# Patient Record
Sex: Female | Born: 1950
Health system: Southern US, Academic
[De-identification: ages and names within clinical notes are randomized; demographics above are authoritative.]

## PROBLEM LIST (undated history)

## (undated) ENCOUNTER — Encounter
Attending: Student in an Organized Health Care Education/Training Program | Primary: Student in an Organized Health Care Education/Training Program

## (undated) ENCOUNTER — Encounter: Attending: Adult Health | Primary: Adult Health

## (undated) ENCOUNTER — Encounter

## (undated) ENCOUNTER — Encounter: Attending: Internal Medicine | Primary: Internal Medicine

## (undated) ENCOUNTER — Ambulatory Visit: Payer: MEDICARE

## (undated) ENCOUNTER — Ambulatory Visit: Payer: Medicare (Managed Care)

## (undated) ENCOUNTER — Telehealth

## (undated) ENCOUNTER — Telehealth: Attending: Internal Medicine | Primary: Internal Medicine

## (undated) ENCOUNTER — Telehealth: Attending: Adult Health | Primary: Adult Health

## (undated) ENCOUNTER — Encounter: Attending: Diagnostic Radiology | Primary: Diagnostic Radiology

## (undated) ENCOUNTER — Ambulatory Visit: Payer: MEDICARE | Attending: Internal Medicine | Primary: Internal Medicine

## (undated) ENCOUNTER — Telehealth
Attending: Student in an Organized Health Care Education/Training Program | Primary: Student in an Organized Health Care Education/Training Program

## (undated) ENCOUNTER — Ambulatory Visit: Payer: MEDICARE | Attending: Oncology | Primary: Oncology

## (undated) ENCOUNTER — Inpatient Hospital Stay

## (undated) ENCOUNTER — Ambulatory Visit

## (undated) ENCOUNTER — Encounter: Attending: Medical | Primary: Medical

## (undated) ENCOUNTER — Ambulatory Visit: Payer: MEDICARE | Attending: Adult Health | Primary: Adult Health

## (undated) ENCOUNTER — Telehealth: Attending: Urology | Primary: Urology

## (undated) ENCOUNTER — Inpatient Hospital Stay: Payer: Medicare (Managed Care)

## (undated) ENCOUNTER — Inpatient Hospital Stay: Payer: MEDICARE

## (undated) ENCOUNTER — Ambulatory Visit
Attending: Student in an Organized Health Care Education/Training Program | Primary: Student in an Organized Health Care Education/Training Program

## (undated) ENCOUNTER — Encounter: Attending: Pulmonary Disease | Primary: Pulmonary Disease

## (undated) ENCOUNTER — Encounter: Attending: Certified Registered" | Primary: Certified Registered"

## (undated) ENCOUNTER — Ambulatory Visit
Payer: Medicare (Managed Care) | Attending: Student in an Organized Health Care Education/Training Program | Primary: Student in an Organized Health Care Education/Training Program

## (undated) ENCOUNTER — Ambulatory Visit: Payer: MEDICARE | Attending: Vascular Surgery | Primary: Vascular Surgery

## (undated) ENCOUNTER — Encounter: Attending: Physical Medicine & Rehabilitation | Primary: Physical Medicine & Rehabilitation

## (undated) ENCOUNTER — Ambulatory Visit: Payer: MEDICARE | Attending: Physical Medicine & Rehabilitation | Primary: Physical Medicine & Rehabilitation

## (undated) ENCOUNTER — Telehealth: Attending: Oncology | Primary: Oncology

## (undated) DIAGNOSIS — E785 Hyperlipidemia, unspecified: Secondary | ICD-10-CM

## (undated) DIAGNOSIS — G2581 Restless legs syndrome: Secondary | ICD-10-CM

## (undated) DIAGNOSIS — Z8719 Personal history of other diseases of the digestive system: Secondary | ICD-10-CM

## (undated) DIAGNOSIS — K219 Gastro-esophageal reflux disease without esophagitis: Secondary | ICD-10-CM

## (undated) DIAGNOSIS — F32A Depression, unspecified: Secondary | ICD-10-CM

## (undated) DIAGNOSIS — Z8679 Personal history of other diseases of the circulatory system: Secondary | ICD-10-CM

## (undated) DIAGNOSIS — K5790 Diverticulosis of intestine, part unspecified, without perforation or abscess without bleeding: Secondary | ICD-10-CM

## (undated) DIAGNOSIS — M51369 Other intervertebral disc degeneration, lumbar region without mention of lumbar back pain or lower extremity pain: Secondary | ICD-10-CM

## (undated) DIAGNOSIS — I10 Essential (primary) hypertension: Secondary | ICD-10-CM

## (undated) DIAGNOSIS — G8929 Other chronic pain: Secondary | ICD-10-CM

## (undated) DIAGNOSIS — H43819 Vitreous degeneration, unspecified eye: Secondary | ICD-10-CM

## (undated) DIAGNOSIS — J309 Allergic rhinitis, unspecified: Secondary | ICD-10-CM

## (undated) DIAGNOSIS — M797 Fibromyalgia: Secondary | ICD-10-CM

## (undated) DIAGNOSIS — G4733 Obstructive sleep apnea (adult) (pediatric): Secondary | ICD-10-CM

## (undated) DIAGNOSIS — M5136 Other intervertebral disc degeneration, lumbar region: Secondary | ICD-10-CM

## (undated) DIAGNOSIS — M545 Low back pain, unspecified: Secondary | ICD-10-CM

## (undated) DIAGNOSIS — F329 Major depressive disorder, single episode, unspecified: Secondary | ICD-10-CM

## (undated) DIAGNOSIS — M1990 Unspecified osteoarthritis, unspecified site: Secondary | ICD-10-CM

## (undated) DIAGNOSIS — Z87448 Personal history of other diseases of urinary system: Secondary | ICD-10-CM

## (undated) DIAGNOSIS — Z8711 Personal history of peptic ulcer disease: Secondary | ICD-10-CM

## (undated) DIAGNOSIS — E669 Obesity, unspecified: Secondary | ICD-10-CM

## (undated) DIAGNOSIS — Z8669 Personal history of other diseases of the nervous system and sense organs: Secondary | ICD-10-CM

## (undated) HISTORY — DX: Gastro-esophageal reflux disease without esophagitis: K21.9

## (undated) HISTORY — DX: Personal history of other diseases of the digestive system: Z87.19

## (undated) HISTORY — DX: Fibromyalgia: M79.7

## (undated) HISTORY — DX: Depression, unspecified: F32.A

## (undated) HISTORY — DX: Personal history of peptic ulcer disease: Z87.11

## (undated) HISTORY — DX: Restless legs syndrome: G25.81

## (undated) HISTORY — DX: Low back pain: M54.5

## (undated) HISTORY — DX: Personal history of other diseases of the nervous system and sense organs: Z86.69

## (undated) HISTORY — DX: Unspecified osteoarthritis, unspecified site: M19.90

## (undated) HISTORY — DX: Obstructive sleep apnea (adult) (pediatric): G47.33

## (undated) HISTORY — DX: Diverticulosis of intestine, part unspecified, without perforation or abscess without bleeding: K57.90

## (undated) HISTORY — DX: Major depressive disorder, single episode, unspecified: F32.9

## (undated) HISTORY — DX: Hyperlipidemia, unspecified: E78.5

## (undated) HISTORY — DX: Obesity, unspecified: E66.9

## (undated) HISTORY — DX: Allergic rhinitis, unspecified: J30.9

## (undated) HISTORY — DX: Personal history of other diseases of the circulatory system: Z86.79

## (undated) HISTORY — DX: Other intervertebral disc degeneration, lumbar region without mention of lumbar back pain or lower extremity pain: M51.369

## (undated) HISTORY — DX: Essential (primary) hypertension: I10

## (undated) HISTORY — DX: Low back pain, unspecified: M54.50

## (undated) HISTORY — DX: Other chronic pain: G89.29

## (undated) HISTORY — DX: Other intervertebral disc degeneration, lumbar region: M51.36

## (undated) HISTORY — DX: Personal history of other diseases of urinary system: Z87.448

## (undated) HISTORY — DX: Vitreous degeneration, unspecified eye: H43.819

---

## 1990-02-06 HISTORY — PX: TONSILLECTOMY AND ADENOIDECTOMY: SHX28

## 2004-05-15 ENCOUNTER — Emergency Department: Payer: Self-pay | Admitting: Emergency Medicine

## 2004-07-27 ENCOUNTER — Ambulatory Visit: Payer: Self-pay | Admitting: Physician Assistant

## 2006-02-06 DIAGNOSIS — K5790 Diverticulosis of intestine, part unspecified, without perforation or abscess without bleeding: Secondary | ICD-10-CM

## 2006-02-06 HISTORY — PX: CARDIOVASCULAR STRESS TEST: SHX262

## 2006-02-06 HISTORY — DX: Diverticulosis of intestine, part unspecified, without perforation or abscess without bleeding: K57.90

## 2006-03-09 HISTORY — PX: COLONOSCOPY: SHX174

## 2006-03-09 HISTORY — PX: ESOPHAGOGASTRODUODENOSCOPY: SHX1529

## 2006-03-09 LAB — HM COLONOSCOPY

## 2009-08-17 ENCOUNTER — Emergency Department: Payer: Self-pay | Admitting: Unknown Physician Specialty

## 2010-02-06 HISTORY — PX: TOTAL ABDOMINAL HYSTERECTOMY: SHX209

## 2011-03-18 ENCOUNTER — Inpatient Hospital Stay: Payer: Self-pay | Admitting: Internal Medicine

## 2011-03-18 LAB — CBC
HGB: 15 g/dL (ref 12.0–16.0)
MCV: 86 fL (ref 80–100)
Platelet: 158 10*3/uL (ref 150–440)
RBC: 5.11 10*6/uL (ref 3.80–5.20)
RDW: 13.3 % (ref 11.5–14.5)
WBC: 11.1 10*3/uL — ABNORMAL HIGH (ref 3.6–11.0)

## 2011-03-18 LAB — COMPREHENSIVE METABOLIC PANEL
Albumin: 3.9 g/dL (ref 3.4–5.0)
Alkaline Phosphatase: 60 U/L (ref 50–136)
BUN: 17 mg/dL (ref 7–18)
Bilirubin,Total: 0.4 mg/dL (ref 0.2–1.0)
Co2: 30 mmol/L (ref 21–32)
Creatinine: 1.06 mg/dL (ref 0.60–1.30)
Glucose: 107 mg/dL — ABNORMAL HIGH (ref 65–99)
Osmolality: 287 (ref 275–301)
Sodium: 143 mmol/L (ref 136–145)
Total Protein: 7.7 g/dL (ref 6.4–8.2)

## 2011-03-18 LAB — LIPASE, BLOOD: Lipase: 91 U/L (ref 73–393)

## 2011-03-18 LAB — PROTIME-INR: INR: 0.9

## 2011-03-19 LAB — CBC WITH DIFFERENTIAL/PLATELET
Basophil #: 0 10*3/uL (ref 0.0–0.1)
Eosinophil #: 0 10*3/uL (ref 0.0–0.7)
HCT: 38.7 % (ref 35.0–47.0)
Lymphocyte #: 1.3 10*3/uL (ref 1.0–3.6)
Lymphocyte %: 13.8 %
MCHC: 33.4 g/dL (ref 32.0–36.0)
MCV: 88 fL (ref 80–100)
Neutrophil #: 7.3 10*3/uL — ABNORMAL HIGH (ref 1.4–6.5)
Neutrophil %: 77 %
RDW: 13.3 % (ref 11.5–14.5)

## 2011-03-19 LAB — COMPREHENSIVE METABOLIC PANEL
Albumin: 3.1 g/dL — ABNORMAL LOW (ref 3.4–5.0)
Alkaline Phosphatase: 50 U/L (ref 50–136)
BUN: 13 mg/dL (ref 7–18)
Calcium, Total: 8.3 mg/dL — ABNORMAL LOW (ref 8.5–10.1)
Chloride: 104 mmol/L (ref 98–107)
Co2: 27 mmol/L (ref 21–32)
Creatinine: 0.87 mg/dL (ref 0.60–1.30)
Glucose: 108 mg/dL — ABNORMAL HIGH (ref 65–99)
Osmolality: 282 (ref 275–301)
SGOT(AST): 16 U/L (ref 15–37)
Sodium: 141 mmol/L (ref 136–145)

## 2011-03-19 LAB — URINALYSIS, COMPLETE
Bilirubin,UR: NEGATIVE
Blood: NEGATIVE
Glucose,UR: NEGATIVE mg/dL (ref 0–75)
Nitrite: NEGATIVE
Protein: NEGATIVE
Specific Gravity: 1.038 (ref 1.003–1.030)
Squamous Epithelial: 5

## 2011-03-19 LAB — CLOSTRIDIUM DIFFICILE BY PCR

## 2011-03-19 LAB — MAGNESIUM: Magnesium: 1.4 mg/dL — ABNORMAL LOW

## 2011-03-21 LAB — STOOL CULTURE

## 2011-03-21 LAB — HEMOGLOBIN: HGB: 12.6 g/dL (ref 12.0–16.0)

## 2011-03-24 LAB — CULTURE, BLOOD (SINGLE)

## 2011-04-07 HISTORY — PX: OTHER SURGICAL HISTORY: SHX169

## 2011-06-10 ENCOUNTER — Ambulatory Visit: Payer: Self-pay

## 2011-06-10 LAB — RAPID STREP-A WITH REFLX: Micro Text Report: NEGATIVE

## 2012-12-05 ENCOUNTER — Encounter: Payer: Self-pay | Admitting: Family Medicine

## 2012-12-05 ENCOUNTER — Ambulatory Visit (INDEPENDENT_AMBULATORY_CARE_PROVIDER_SITE_OTHER): Payer: Medicare PPO | Admitting: Family Medicine

## 2012-12-05 VITALS — BP 128/78 | HR 79 | Temp 98.2°F | Ht 62.5 in | Wt 202.5 lb

## 2012-12-05 DIAGNOSIS — F32A Depression, unspecified: Secondary | ICD-10-CM

## 2012-12-05 DIAGNOSIS — F329 Major depressive disorder, single episode, unspecified: Secondary | ICD-10-CM

## 2012-12-05 DIAGNOSIS — M797 Fibromyalgia: Secondary | ICD-10-CM | POA: Insufficient documentation

## 2012-12-05 DIAGNOSIS — G2581 Restless legs syndrome: Secondary | ICD-10-CM

## 2012-12-05 DIAGNOSIS — M129 Arthropathy, unspecified: Secondary | ICD-10-CM

## 2012-12-05 DIAGNOSIS — F3289 Other specified depressive episodes: Secondary | ICD-10-CM

## 2012-12-05 DIAGNOSIS — M25579 Pain in unspecified ankle and joints of unspecified foot: Secondary | ICD-10-CM

## 2012-12-05 DIAGNOSIS — M1990 Unspecified osteoarthritis, unspecified site: Secondary | ICD-10-CM

## 2012-12-05 DIAGNOSIS — IMO0001 Reserved for inherently not codable concepts without codable children: Secondary | ICD-10-CM

## 2012-12-05 DIAGNOSIS — Z23 Encounter for immunization: Secondary | ICD-10-CM

## 2012-12-05 DIAGNOSIS — E785 Hyperlipidemia, unspecified: Secondary | ICD-10-CM

## 2012-12-05 DIAGNOSIS — I10 Essential (primary) hypertension: Secondary | ICD-10-CM

## 2012-12-05 DIAGNOSIS — K219 Gastro-esophageal reflux disease without esophagitis: Secondary | ICD-10-CM

## 2012-12-05 DIAGNOSIS — M25572 Pain in left ankle and joints of left foot: Secondary | ICD-10-CM

## 2012-12-05 NOTE — Assessment & Plan Note (Signed)
Not consistent with ankle sprain given chronicity, not consistent with peroneal tendonitis. Will monitor for now.  Pt aware to return if persistent or worsening.

## 2012-12-05 NOTE — Assessment & Plan Note (Signed)
Stable on ppi. Continue.

## 2012-12-05 NOTE — Progress Notes (Signed)
Subjective:    Patient ID: Michelle Mcgrath, female    DOB: [birth year], 62 y.o.   MRN: [id number]  HPI CC: new pt to establish  Lives with fiancee - recently moved from [place].  Prior saw Dr. Anselm Jungling.  Will fill out form today. Lost son in May to bladder cancer.  L ankle pain - started after she stepped badly off sidewalk 1990s.  Intermittent pain.  Getting worse.  Points to lateral ankle joint.  On disability for back pain from arthritis and depression.  Fibromyalgia - tends to get worse in winter. RLS on gabapentin for this. Arthritis - unsure which, likely osteoarthritis.  Told had deteriorating discs in back.  Hand arthritis.  Present for several years. HTN - compliant with med. HLD - compliant with lovastatin.  No myalgias with this. GERD - on omeprazole regularly. Depression - was on cymbalta then prior PCP discontinued this.  Preventative: Last CPE unsure Flu shot today  Lives with fiance, 1 dog Grown children (2 sons, youngest died with bladder cancer) Occ: disability for FM, lower back pain, depression Edu: 8th grade Activity: no regular exercise  Medications and allergies reviewed and updated in chart.  Past histories reviewed and updated if relevant as below. There are no active problems to display for this patient.  Past Medical History  Diagnosis Date  . Arthritis     back and fingers  . Fibromyalgia   . History of depression   . GERD (gastroesophageal reflux disease)   . History of stomach ulcers   . Allergic rhinitis     environmental  . Hypertension   . HLD (hyperlipidemia)   . History of migraine     none since better BP control  . History of rheumatic fever    Past Surgical History  Procedure Laterality Date  . Total abdominal hysterectomy  2012    bladder drop  . Tonsillectomy and adenoidectomy  1992   History  Substance Use Topics  . Smoking status: Never Smoker   . Smokeless tobacco: Never Used  . Alcohol Use: No   Family History   Problem Relation Age of Onset  . Hypertension Son   . Arthritis Son   . CAD Neg Hx   . Stroke Neg Hx   . Diabetes Neg Hx   . Cancer Son     bladder (smoker)   No Known Allergies No current outpatient prescriptions on file prior to visit.   No current facility-administered medications on file prior to visit.     Review of Systems  Constitutional: Negative for fever, chills, activity change, appetite change, fatigue and unexpected weight change.  HENT: Negative for hearing loss.   Eyes: Negative for visual disturbance.  Respiratory: Negative for cough, chest tightness, shortness of breath and wheezing.   Cardiovascular: Negative for chest pain, palpitations and leg swelling.  Gastrointestinal: Negative for nausea, vomiting, abdominal pain, diarrhea, constipation, blood in stool and abdominal distention.  Genitourinary: Negative for hematuria and difficulty urinating.  Musculoskeletal: Positive for arthralgias and back pain. Negative for myalgias and neck pain.  Skin: Negative for rash.  Neurological: Negative for dizziness, seizures, syncope and headaches.  Hematological: Negative for adenopathy. Does not bruise/bleed easily.  Psychiatric/Behavioral: Negative for dysphoric mood. The patient is not nervous/anxious.        Objective:   Physical Exam  Nursing note and vitals reviewed. Constitutional: She is oriented to person, place, and time. She appears well-developed and well-nourished. No distress.  HENT:  Head: Normocephalic and  atraumatic.  Mouth/Throat: Oropharynx is clear and moist. No oropharyngeal exudate.  Eyes: Conjunctivae and EOM are normal. Pupils are equal, round, and reactive to light. No scleral icterus.  Neck: Normal range of motion. Neck supple. No thyromegaly present.  Cardiovascular: Normal rate, regular rhythm, normal heart sounds and intact distal pulses.   No murmur heard. Pulses:      Radial pulses are 2+ on the right side, and 2+ on the left side.   Pulmonary/Chest: Effort normal and breath sounds normal. No respiratory distress. She has no wheezes. She has no rales.  Musculoskeletal: Normal range of motion. She exhibits no edema.  L foot: No ligament laxity. No lateral/medial malleolar tenderness 2+ DP bilaterally Sensation intact Tender around navicular on left. No peroneal tendon tenderness  Lymphadenopathy:    She has no cervical adenopathy.  Neurological: She is alert and oriented to person, place, and time.  CN grossly intact, station and gait intact  Skin: Skin is warm and dry. No rash noted.  Psychiatric: She has a normal mood and affect. Her behavior is normal. Judgment and thought content normal.       Assessment & Plan:

## 2012-12-05 NOTE — Assessment & Plan Note (Signed)
Continue gabapentin for this.  

## 2012-12-05 NOTE — Addendum Note (Signed)
Addended by: Shon Millet on: 12/05/2012 04:35 PM   Modules accepted: Orders

## 2012-12-05 NOTE — Assessment & Plan Note (Signed)
Await records. Off cymbalta. Continue to monitor. On tylenol prn pain.

## 2012-12-05 NOTE — Assessment & Plan Note (Signed)
Chronic, stable. Continue lovastatin. Check FLP next fasting blood work - await records.

## 2012-12-05 NOTE — Assessment & Plan Note (Signed)
Only on wellbutrin. Continue med.  Await records.

## 2012-12-05 NOTE — Assessment & Plan Note (Signed)
Chronic, stable. Well controlled. Continue regimen.

## 2012-12-05 NOTE — Assessment & Plan Note (Signed)
Anticipate OA. Await records. Discussed stopping narcotic and using tylenol OTC instead.  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

## 2012-12-05 NOTE — Patient Instructions (Addendum)
Sign release form for records from prior doctor, Dr. Enrigue Catena in [place]. Flu shot today. You are doing well today. Return in 3-6 months for follow up or as needed.

## 2012-12-18 ENCOUNTER — Telehealth: Payer: Self-pay

## 2012-12-18 NOTE — Telephone Encounter (Signed)
Pt left v/m; seen to establish 12/05/12 and pt continuing problem with restless leg syndrome. Gabapentin makes pt sleepy; unable to reach pt for further info. Left v/m requesting cb.

## 2012-12-20 ENCOUNTER — Encounter: Payer: Self-pay | Admitting: *Deleted

## 2012-12-20 ENCOUNTER — Ambulatory Visit (INDEPENDENT_AMBULATORY_CARE_PROVIDER_SITE_OTHER): Payer: Medicare PPO | Admitting: Family Medicine

## 2012-12-20 ENCOUNTER — Encounter: Payer: Self-pay | Admitting: Family Medicine

## 2012-12-20 VITALS — BP 144/82 | HR 68 | Temp 98.2°F | Wt 210.8 lb

## 2012-12-20 DIAGNOSIS — G2581 Restless legs syndrome: Secondary | ICD-10-CM

## 2012-12-20 DIAGNOSIS — F32A Depression, unspecified: Secondary | ICD-10-CM

## 2012-12-20 DIAGNOSIS — R5381 Other malaise: Secondary | ICD-10-CM

## 2012-12-20 DIAGNOSIS — F3289 Other specified depressive episodes: Secondary | ICD-10-CM

## 2012-12-20 DIAGNOSIS — R5383 Other fatigue: Secondary | ICD-10-CM

## 2012-12-20 DIAGNOSIS — F329 Major depressive disorder, single episode, unspecified: Secondary | ICD-10-CM

## 2012-12-20 LAB — BASIC METABOLIC PANEL
Calcium: 9.4 mg/dL (ref 8.4–10.5)
Chloride: 102 mEq/L (ref 96–112)
GFR: 66.02 mL/min (ref >60.00)
Glucose, Bld: 101 mg/dL — ABNORMAL HIGH (ref 70–99)
Potassium: 4 mEq/L (ref 3.5–5.1)
Sodium: 139 mEq/L (ref 135–145)

## 2012-12-20 LAB — CBC WITH DIFFERENTIAL/PLATELET
Basophils Absolute: 0 10*3/uL (ref 0.0–0.1)
Eosinophils Absolute: 0.1 10*3/uL (ref 0.0–0.7)
HCT: 41.9 % (ref 36.0–46.0)
Lymphs Abs: 2 10*3/uL (ref 0.7–4.0)
MCHC: 33.9 g/dL (ref 30.0–36.0)
MCV: 86.3 fl (ref 78.0–100.0)
Monocytes Absolute: 0.7 10*3/uL (ref 0.1–1.0)
Platelets: 164 10*3/uL (ref 150.0–400.0)
RDW: 13.3 % (ref 11.5–14.6)

## 2012-12-20 LAB — IBC PANEL: Iron: 98 ug/dL (ref 42–145)

## 2012-12-20 MED ORDER — SERTRALINE HCL 25 MG PO TABS: 25.0000 mg | ORAL_TABLET | Freq: Every day | ORAL | Status: DC

## 2012-12-20 MED ORDER — ROPINIROLE HCL 0.25 MG PO TABS: 0.5000 mg | ORAL_TABLET | Freq: Every day | ORAL | Status: DC

## 2012-12-20 NOTE — Assessment & Plan Note (Signed)
Worsening as holiday season approaching.  On wellbutrin 150mg  SL bid daily. Prior on cymbalta 60mg  daily but prior PCP discontinued this. Will start sertraline 25mg  daily, consider increase as needed.

## 2012-12-20 NOTE — Assessment & Plan Note (Addendum)
Actually story consistent with worsening RLS, as well as intolerance to gabapentin 2/2 oversedation. Will stop gabapentin and start requip - start at 0.25mg  nightly, then increase to 0.5mg  nightly for several weeks, pt will update me with effect and will decide need for continued titration accordingly. Check iron panel for worsening RLS

## 2012-12-20 NOTE — Patient Instructions (Addendum)
Let's start sertraline instead of cymbalta.  Continue wellbutrin for now twice daily. For restless legs - stop gabapentin as it's overly sedating.  Start requip at 1 tablet nightly for the next 3 nights then increase to 2 tablets nightly and after 2 weeks give me an update on how legs are doing. Blood work today to check for other causes of fatigue. Give me an update with how you're doing in the next few weeks I'm still waiting on prior doctor's records

## 2012-12-20 NOTE — Progress Notes (Signed)
  Subjective:    Patient ID: Michelle Mcgrath, female    DOB: 1951/01/06, 62 y.o.   MRN: [id number]  HPI CC: bilateral leg pain  Pleasant 62 yo with h/o FM, RLS and arthritis (unsure type but anticipate OA) presents with worsening leg pains.  Endorses constant leg ache and weakness from thighs to calves.  Pain worse at night time but can be constant.  Some left ankle burning pain.  Actually describes discomfort as "restlessness sensation with need to move legs to relieve sensation". No back pain, leg numbness, bowel/bladder accidents, fevers.  No actual leg pain. Staying tired and fatigued. She regularly takes tylenol 2-4 tablets daily for pain and gabapentin 600mg  nightly (for RLS).  Finds that she stays sedated into the next day with her gabapentin.  Wt Readings from Last 3 Encounters:  12/20/12 210 lb 12 oz (95.596 kg)  12/05/12 202 lb 8 oz (91.853 kg)  8 lb weight gain in last 2 weeks.  Denies chest pain, coughing.  Some dyspnea worse with exertion.  No orthopnea.  No leg swelling.  Past Medical History  Diagnosis Date  . Arthritis     back and fingers  . Fibromyalgia   . Depression   . GERD (gastroesophageal reflux disease)   . History of stomach ulcers   . Allergic rhinitis     environmental  . Hypertension   . HLD (hyperlipidemia)   . History of migraine     none since better BP control  . History of rheumatic fever   . RLS (restless legs syndrome)     on gabapentin    Review of Systems Per HPI    Objective:   Physical Exam  Nursing note and vitals reviewed. Constitutional: She appears well-developed and well-nourished. No distress.  Musculoskeletal: She exhibits no edema.  Psychiatric: She has a normal mood and affect. Her behavior is normal. Judgment and thought content normal.  Tears with discussion of son's passing last May       Assessment & Plan:

## 2012-12-20 NOTE — Progress Notes (Signed)
Pre-visit discussion using our clinic review tool. No additional management support is needed unless otherwise documented below in the visit note.  

## 2012-12-25 NOTE — Telephone Encounter (Signed)
Pt was seen 12/20/12 for restless leg syndrome.

## 2013-01-10 ENCOUNTER — Telehealth: Payer: Self-pay

## 2013-01-10 MED ORDER — TRAMADOL HCL 50 MG PO TABS: 50.0000 mg | ORAL_TABLET | Freq: Three times a day (TID) | ORAL | Status: DC | PRN

## 2013-01-10 NOTE — Telephone Encounter (Signed)
Pt left v/m requesting cb. 

## 2013-01-10 NOTE — Telephone Encounter (Signed)
Noted. Agree thanks.  

## 2013-01-10 NOTE — Telephone Encounter (Signed)
Pt seen 12/20/12; pt discussed with Dr Reece Agar pain top of lt foot and lt ankle;lt foot and ankle is swollen, no redness. When pt keeps feet up when goes to bed swelling goes down. pt has tried soaking the foot, ibuprofen and arthritis cream with no relief of pain. Pain level now is 8. Lt foot hurts worse upon weight bearing.[facility]. Pt request cb.

## 2013-01-10 NOTE — Telephone Encounter (Signed)
When i evaluated her I thought sxs more consistent with RLS - and prescribed requip. How has this affected sxs?  Will likely recommend f/u appt next week. May try tramadol prn pain - plz phone in and caution sedation precautions.

## 2013-01-10 NOTE — Telephone Encounter (Signed)
Rx called in as directed. Patient notified. She said that requip has helped some but she is still having pain which is making her feel as though she has to get up and walk around. Follow up scheduled for next week.

## 2013-01-17 ENCOUNTER — Encounter: Payer: Self-pay | Admitting: Family Medicine

## 2013-01-17 ENCOUNTER — Ambulatory Visit (INDEPENDENT_AMBULATORY_CARE_PROVIDER_SITE_OTHER): Payer: Medicare PPO | Admitting: Family Medicine

## 2013-01-17 VITALS — BP 134/72 | HR 72 | Temp 98.0°F | Wt 207.5 lb

## 2013-01-17 DIAGNOSIS — M25572 Pain in left ankle and joints of left foot: Secondary | ICD-10-CM

## 2013-01-17 DIAGNOSIS — G2581 Restless legs syndrome: Secondary | ICD-10-CM

## 2013-01-17 DIAGNOSIS — F32A Depression, unspecified: Secondary | ICD-10-CM

## 2013-01-17 DIAGNOSIS — M25579 Pain in unspecified ankle and joints of unspecified foot: Secondary | ICD-10-CM

## 2013-01-17 DIAGNOSIS — F3289 Other specified depressive episodes: Secondary | ICD-10-CM

## 2013-01-17 DIAGNOSIS — F329 Major depressive disorder, single episode, unspecified: Secondary | ICD-10-CM

## 2013-01-17 MED ORDER — ROPINIROLE HCL 1 MG PO TABS: 1.0000 mg | ORAL_TABLET | Freq: Every day | ORAL | Status: DC

## 2013-01-17 NOTE — Progress Notes (Signed)
   Subjective:    Patient ID: Michelle Mcgrath, female    DOB: 1950-09-20, 62 y.o.   MRN: [id number]  HPI CC: discuss leg pains  Pleasant 62 yo with h/o FM, RLS and arthritis (unsure type but anticipate OA) presents with worsening leg pains.  Michelle Mcgrath presents today to discuss worsening leg pains.  Last evaluation (12/2012) she was thought to have RLS so her gabapentin (causing excessive sedation into next day) was changed to requip at night.  She is currently taking 0.5mg  nightly but is interested in increased dose 2/2 persistent restless legs.  She states she has had left ankle pain ongoing since 1990s.  At that time had injury to ankle - severe sprain.  Never evaluated for this.  Self treated with ice.  Last week left ankle started swelling again, worse pain with prolonged standing.  Tramadol was prescribed last week - which has helped with her ankle pain.  Past Medical History  Diagnosis Date  . Arthritis     back and fingers  . Fibromyalgia   . Depression   . GERD (gastroesophageal reflux disease)   . History of stomach ulcers   . Allergic rhinitis     environmental  . Hypertension   . HLD (hyperlipidemia)   . History of migraine     none since better BP control  . History of rheumatic fever   . RLS (restless legs syndrome)     on gabapentin     Review of Systems Per HPI    Objective:   Physical Exam  Nursing note and vitals reviewed. Constitutional: She appears well-developed and well-nourished. No distress.  Musculoskeletal: She exhibits edema (mild nonpitting).  No pain with inversion/eversion of left ankle.  No marked swelling or deformity. Nonspecific tenderness at proximal dorsal midfoot. No heel or sole pain       Assessment & Plan:

## 2013-01-17 NOTE — Assessment & Plan Note (Signed)
Much improved on sertraline 25mg  daily.  Continue this as well as wellbutrin 150mg  SL bid.

## 2013-01-17 NOTE — Patient Instructions (Signed)
Let's increase requip to 1 mg daily (new dose is at pharmacy) Continue sertraline 25mg  daily - make sure not to suddenly stop this medicine. Continue tramadol as needed or left ankle pain - I do think you have arthritis after injury. Follow up as needed. Merry Christmas!

## 2013-01-17 NOTE — Assessment & Plan Note (Signed)
This has improved on requip but could be better - will increase requip to 1mg  nightly. Advised pt to monitor for dizziness, somnolence and hypotension on higher dose.

## 2013-01-17 NOTE — Assessment & Plan Note (Signed)
Anticipate some ankle arthritis after remote injury.  Recommended elevation of leg and ice.  If not better, consider xray.  May continue tramadol for now

## 2013-01-17 NOTE — Progress Notes (Signed)
Pre-visit discussion using our clinic review tool. No additional management support is needed unless otherwise documented below in the visit note.  

## 2013-02-03 ENCOUNTER — Encounter: Payer: Self-pay | Admitting: Family Medicine

## 2013-02-03 ENCOUNTER — Ambulatory Visit (INDEPENDENT_AMBULATORY_CARE_PROVIDER_SITE_OTHER): Payer: Medicare PPO | Admitting: Family Medicine

## 2013-02-03 VITALS — BP 134/78 | HR 80 | Temp 98.1°F | Wt 210.2 lb

## 2013-02-03 DIAGNOSIS — J019 Acute sinusitis, unspecified: Secondary | ICD-10-CM

## 2013-02-03 DIAGNOSIS — J309 Allergic rhinitis, unspecified: Secondary | ICD-10-CM | POA: Insufficient documentation

## 2013-02-03 MED ORDER — GUAIFENESIN-CODEINE 100-10 MG/5ML PO SYRP: 5.0000 mL | ORAL_SOLUTION | Freq: Two times a day (BID) | ORAL | Status: DC | PRN

## 2013-02-03 MED ORDER — AMOXICILLIN-POT CLAVULANATE 875-125 MG PO TABS: 1.0000 | ORAL_TABLET | Freq: Two times a day (BID) | ORAL | Status: AC

## 2013-02-03 NOTE — Assessment & Plan Note (Addendum)
Discussed anticipated viral course given short duration - cheratussin for cough at night time. Provided with [facility] script for augmentin and discussed with patient reasons to fill abx Fill abx if worsening sxs or persistent past 10 days Large allergic rhinitis component to sxs today.  rec continue flonase and claritin and minimize use of neosinephrine.

## 2013-02-03 NOTE — Patient Instructions (Signed)
Sounds like you have a viral upper respiratory infection. Antibiotics are not needed for this.  Viral infections usually take 7-10 days to resolve.  The cough can last a few weeks to go away. Use medication as prescribed: cheratussin codeine cough syrup for night time.  If worsening instead of improving, or if symptoms persist past 10 days, fill antibiotic (augmentin). Tylenol 500mg  three times daily for pain. Push fluids and plenty of rest. Plain mucinex or immediate release guaifenesin with plenty of fluids to help mobilize mucous. Please return if you are not improving as expected, or if you have high fevers (>101.5) or difficulty swallowing or worsening productive cough. Call clinic with questions.  Good to see you today.

## 2013-02-03 NOTE — Progress Notes (Signed)
   Subjective:    Patient ID: Michelle Mcgrath, female    DOB: [birth year], 62 y.o.   MRN: [id number]  HPI CC: URI  4d h/o congestion, sneezing, R eye watery.  Gum hurt initially but now better.  R facial pain and pressure.  + cough nonproductive.  Trouble sleeping 2/2 trouble breathing when laying flat 2/2 congestion.  + sneezing.  No fevers/chills, ear pain, body aches.  No PNdrainage or abd pain, nausea.  Has been taking neosinephrine nasal spray and claritin 10mg  daily.  Has been using flonase. Drinking water and eating chicken noodle soup.  + sick contacts at home. No smokers at home. No h/o asthma/COPD.  ++ h/o allergic rhinitis.  Past Medical History  Diagnosis Date  . Arthralgia     back and fingers  . Fibromyalgia   . Depression   . GERD (gastroesophageal reflux disease)   . History of stomach ulcers   . Allergic rhinitis     environmental  . Hypertension   . HLD (hyperlipidemia)   . History of migraine     none since better BP control  . History of rheumatic fever   . RLS (restless legs syndrome)     on gabapentin     Review of Systems Per HPi    Objective:   Physical Exam  Nursing note and vitals reviewed. Constitutional: She appears well-developed and well-nourished. No distress.  HENT:  Head: Normocephalic and atraumatic.  Right Ear: Hearing, tympanic membrane, external ear and ear canal normal.  Left Ear: Hearing, tympanic membrane, external ear and ear canal normal.  Nose: Mucosal edema present. No rhinorrhea. Right sinus exhibits maxillary sinus tenderness and frontal sinus tenderness. Left sinus exhibits maxillary sinus tenderness and frontal sinus tenderness.  Mouth/Throat: Uvula is midline, oropharynx is clear and moist and mucous membranes are normal. No oropharyngeal exudate.  Swollen turbinates bilaterally pale and boggy  Eyes: Conjunctivae and EOM are normal. Pupils are equal, round, and reactive to light. No scleral icterus.  Neck: Normal range  of motion. Neck supple.  Cardiovascular: Normal rate, regular rhythm, normal heart sounds and intact distal pulses.   No murmur heard. Pulmonary/Chest: Effort normal and breath sounds normal. No respiratory distress. She has no wheezes. She has no rales.  Lymphadenopathy:    She has no cervical adenopathy.  Skin: Skin is warm and dry. No rash noted.       Assessment & Plan:

## 2013-02-03 NOTE — Progress Notes (Signed)
Pre-visit discussion using our clinic review tool. No additional management support is needed unless otherwise documented below in the visit note.  

## 2013-03-07 ENCOUNTER — Ambulatory Visit: Payer: Medicare PPO | Admitting: Family Medicine

## 2013-03-07 ENCOUNTER — Encounter: Payer: Self-pay | Admitting: Family Medicine

## 2013-03-07 ENCOUNTER — Ambulatory Visit (INDEPENDENT_AMBULATORY_CARE_PROVIDER_SITE_OTHER): Payer: Medicare PPO | Admitting: Family Medicine

## 2013-03-07 VITALS — BP 118/84 | HR 80 | Temp 98.2°F | Wt 216.8 lb

## 2013-03-07 DIAGNOSIS — E669 Obesity, unspecified: Secondary | ICD-10-CM

## 2013-03-07 DIAGNOSIS — R05 Cough: Secondary | ICD-10-CM

## 2013-03-07 DIAGNOSIS — R059 Cough, unspecified: Secondary | ICD-10-CM

## 2013-03-07 DIAGNOSIS — I10 Essential (primary) hypertension: Secondary | ICD-10-CM

## 2013-03-07 HISTORY — DX: Obesity, unspecified: E66.9

## 2013-03-07 MED ORDER — FLUTICASONE PROPIONATE 50 MCG/ACT NA SUSP: 2.0000 | Freq: Every day | NASAL | Status: AC

## 2013-03-07 NOTE — Assessment & Plan Note (Signed)
After initial sinusitis. Anticipate large allergic component. Recommended start claritin daily along with flonase which was refilled today Update if sxs persist despite this. No GERD sxs today.

## 2013-03-07 NOTE — Progress Notes (Signed)
   Subjective:    Patient ID: Michelle Mcgrath E Mcgrath, female    DOB: 01/04/1951, 63 y.o.   MRN: [id number]  HPI CC: 1 mo f/u  Seen here last month with dx acute sinusitis with large allergic rhinitis component.  cheratussin hasn't helped.  Has not been taking claritin, has run out of flonase.  No fevers, no productive cough.  Dry nagging cough. Denies significant GERD.  Taking prilosec 40mg  once dialy  HTN - Compliant with current antihypertensive regimen of enalapril and hctz once daily.  Does not check blood pressures at home.  No low blood pressure symptoms of dizziness/syncope.  Denies HA, vision changes, SOB, leg swelling.   Notes nausea with occasional vomiting after eating sweet food.  Worried about weight.  Poor diet.  Home cooking, likes to bake.  No regular activity.  On weekends walks [place] [employer]. Wt Readings from Last 3 Encounters:  03/07/13 216 lb 12 oz (98.317 kg)  02/03/13 210 lb 4 oz (95.369 kg)  01/17/13 207 lb 8 oz (94.121 kg)   Body mass index is 38.99 kg/(m^2).  Past Medical History  Diagnosis Date  . Fibromyalgia     no mention of this from prior pcp  . Depression   . GERD (gastroesophageal reflux disease)   . History of stomach ulcers   . Allergic rhinitis     environmental  . Hypertension   . HLD (hyperlipidemia)   . History of migraine     none since better BP control  . History of rheumatic fever   . RLS (restless legs syndrome)     on gabapentin  . OSA (obstructive sleep apnea)     sleep study 2010, did not tolerate CPAP   . Osteoarthritis     s/p knee injections  . PVD (posterior vitreous detachment)   . Chronic lower back pain     MRI with annular tear L4/5 with disc protrusion s/p 1 ESI did not tolerate ([facility])  . History of hydronephrosis     left with ARF, ?due to pelvic organ prolapse    Review of Systems Per HPI    Objective:   Physical Exam  Nursing note and vitals reviewed. Constitutional: She appears well-developed and  well-nourished. No distress.  HENT:  Nose: Mucosal edema present. No rhinorrhea.  Mouth/Throat: Oropharynx is clear and moist. No oropharyngeal exudate, posterior oropharyngeal edema, posterior oropharyngeal erythema or tonsillar abscesses.  Pale boggy turbinates  Cardiovascular: Normal rate, regular rhythm, normal heart sounds and intact distal pulses.   No murmur heard. Pulmonary/Chest: Effort normal and breath sounds normal. No respiratory distress. She has no wheezes. She has no rales.  Musculoskeletal: She exhibits no edema.  Skin: Skin is warm and dry. No rash noted.       Assessment & Plan:

## 2013-03-07 NOTE — Assessment & Plan Note (Signed)
Reviewed healthy diet and lifestyle changes to affect sustained weight loss. Encouraged she incorporate fruits and vegetables into every meal, discussed simple vs complex carbs and encouraged decreased carbs in general. No results found for this basename: TSH

## 2013-03-07 NOTE — Assessment & Plan Note (Signed)
Chronic, stable. Continue meds. 

## 2013-03-07 NOTE — Patient Instructions (Signed)
For weight - let's replace simple carbs with more complex carbs (like sweet potatoes, whole grain bread and whole wheat pasta).  Best thing to do is ensure you're getting fruit/vegetables with every meal. For cough - I think this is still a significant allergic component - start claritin daily and I've refilled flonase for you.  This should help cough. Good to see you today, call us with questions.

## 2013-03-07 NOTE — Progress Notes (Signed)
Pre-visit discussion using our clinic review tool. No additional management support is needed unless otherwise documented below in the visit note.  

## 2013-03-10 ENCOUNTER — Encounter: Payer: Self-pay | Admitting: Family Medicine

## 2013-04-10 ENCOUNTER — Encounter: Payer: Self-pay | Admitting: Internal Medicine

## 2013-04-10 ENCOUNTER — Ambulatory Visit (INDEPENDENT_AMBULATORY_CARE_PROVIDER_SITE_OTHER): Payer: Medicare PPO | Admitting: Internal Medicine

## 2013-04-10 VITALS — BP 120/82 | HR 79 | Temp 98.1°F | Wt 215.0 lb

## 2013-04-10 DIAGNOSIS — R059 Cough, unspecified: Secondary | ICD-10-CM

## 2013-04-10 DIAGNOSIS — R05 Cough: Secondary | ICD-10-CM

## 2013-04-10 MED ORDER — BENZONATATE 200 MG PO CAPS: 200.0000 mg | ORAL_CAPSULE | Freq: Two times a day (BID) | ORAL | Status: DC | PRN

## 2013-04-10 MED ORDER — ALBUTEROL SULFATE HFA 108 (90 BASE) MCG/ACT IN AERS: 2.0000 | INHALATION_SPRAY | Freq: Four times a day (QID) | RESPIRATORY_TRACT | Status: DC | PRN

## 2013-04-10 NOTE — Progress Notes (Signed)
Subjective:    Patient ID: Michelle Mcgrath, female    DOB: [birth year], 63 y.o.   MRN: [id number]  HPI  Pt presents to the clinic today with c/o cough. She reports this started about 3 months ago. The cough is constant. Is is dry and non productive. She does have some associated shortness of breath, but denies chest pain, chest pain or chest tightness. She denies fever, chills or body aches. She has taken OTC tylenol for the same without any relief. She does have a history of allergies and is taking claritin and flonase daily. She also has a history of GERD for which she takes prilosec daily.  Review of Systems      Past Medical History  Diagnosis Date  . Fibromyalgia     no mention of this from prior pcp  . Depression   . GERD (gastroesophageal reflux disease)   . History of stomach ulcers   . Allergic rhinitis     environmental  . Hypertension   . HLD (hyperlipidemia)   . History of migraine     none since better BP control  . History of rheumatic fever   . RLS (restless legs syndrome)     on gabapentin  . OSA (obstructive sleep apnea)     sleep study 2010, did not tolerate CPAP   . Osteoarthritis     s/p knee injections  . PVD (posterior vitreous detachment)   . Chronic lower back pain     MRI with annular tear L4/5 with disc protrusion s/p 1 ESI did not tolerate ([facility])  . History of hydronephrosis     left with ARF, ?due to pelvic organ prolapse,resolved  . Obesity 03/07/2013  . DDD (degenerative disc disease), lumbar     Current Outpatient Prescriptions  Medication Sig Dispense Refill  . acetaminophen (TYLENOL) 500 MG tablet Take 500 mg by mouth every 6 (six) hours as needed for pain.      Marland Kitchen aspirin 81 MG tablet Take 81 mg by mouth daily.      Marland Kitchen buPROPion (WELLBUTRIN SR) 150 MG 12 hr tablet Take 150 mg by mouth 2 (two) times daily.      . enalapril (VASOTEC) 20 MG tablet Take 20 mg by mouth daily.      . fluticasone (FLONASE) 50 MCG/ACT nasal spray Place 2  sprays into both nostrils daily.  16 g  3  . hydrochlorothiazide (HYDRODIURIL) 25 MG tablet Take 25 mg by mouth daily.      Marland Kitchen loratadine (CLARITIN) 10 MG tablet Take 10 mg by mouth daily.      Marland Kitchen lovastatin (MEVACOR) 40 MG tablet Take 40 mg by mouth at bedtime.      Marland Kitchen omeprazole (PRILOSEC) 40 MG capsule Take 40 mg by mouth daily.      Marland Kitchen rOPINIRole (REQUIP) 1 MG tablet Take 1 tablet (1 mg total) by mouth at bedtime.  30 tablet  3  . sertraline (ZOLOFT) 25 MG tablet Take 1 tablet (25 mg total) by mouth daily.  30 tablet  6  . traMADol (ULTRAM) 50 MG tablet Take 1 tablet (50 mg total) by mouth every 8 (eight) hours as needed.  30 tablet  0   No current facility-administered medications for this visit.    Allergies  Allergen Reactions  . Gabapentin Other (See Comments)    oversedation  . Lyrica [Pregabalin] Other (See Comments)    Swelling and general fatigue    Family History  Problem Relation  Age of Onset  . Hypertension Son   . Arthritis Son   . CAD Neg Hx   . Stroke Neg Hx   . Diabetes Neg Hx   . Cancer Son     bladder (smoker)    History   Social History  . Marital Status: Single    Spouse Name: N/A    Number of Children: N/A  . Years of Education: N/A   Occupational History  . Not on file.   Social History Main Topics  . Smoking status: Never Smoker   . Smokeless tobacco: Never Used  . Alcohol Use: No  . Drug Use: No  . Sexual Activity: Not on file   Other Topics Concern  . Not on file   Social History Narrative   Lives with fiance, 1 dog   Grown children (2 sons, youngest died with bladder cancer)   Occ: disability for FM, lower back pain, depression   Edu: 8th grade   Activity: no regular exercise   Diet:      Constitutional: Pt reports headache. Denies fever, malaise, fatigue, or abrupt weight changes.  HEENT: Denies eye pain, eye redness, ear pain, ringing in the ears, wax buildup, runny nose, nasal congestion, bloody nose, or sore  throat. Respiratory: Pt reports cough and shortness of breath. Denies difficulty breathing, or sputum production.   Cardiovascular: Denies chest pain, chest tightness, palpitations or swelling in the hands or feet.    No other specific complaints in a complete review of systems (except as listed in HPI above).  Objective:   Physical Exam   BP 120/82  Pulse 79  Temp(Src) 98.1 F (36.7 C) (Oral)  Wt 215 lb (97.523 kg)  SpO2 97% Wt Readings from Last 3 Encounters:  04/10/13 215 lb (97.523 kg)  03/07/13 216 lb 12 oz (98.317 kg)  02/03/13 210 lb 4 oz (95.369 kg)    General: Appears her stated age, obese but well developed, well nourished in NAD. HEENT: Head: normal shape and size; Eyes: sclera white, no icterus, conjunctiva pink, PERRLA and EOMs intact; Ears: Tm's gray and intact, normal light reflex; Nose: mucosa boggy and moist, septum midline; Throat/Mouth: Teeth present, mucosa pink and moist, no exudate, lesions or ulcerations noted.  Cardiovascular: Normal rate and rhythm. S1,S2 noted.  No murmur, rubs or gallops noted. No JVD or BLE edema. No carotid bruits noted. Pulmonary/Chest: Normal effort and positive vesicular breath sounds. No respiratory distress. No wheezes, rales or ronchi noted.    BMET    Component Value Date/Time   NA 139 12/20/2012 1026   K 4.0 12/20/2012 1026   CL 102 12/20/2012 1026   CO2 30 12/20/2012 1026   GLUCOSE 101* 12/20/2012 1026   BUN 18 12/20/2012 1026   CREATININE 0.9 12/20/2012 1026   CALCIUM 9.4 12/20/2012 1026      CBC    Component Value Date/Time   WBC 7.1 12/20/2012 1026   RBC 4.86 12/20/2012 1026   HGB 14.2 12/20/2012 1026   HCT 41.9 12/20/2012 1026   PLT 164.0 12/20/2012 1026   MCV 86.3 12/20/2012 1026   MCHC 33.9 12/20/2012 1026   RDW 13.3 12/20/2012 1026   LYMPHSABS 2.0 12/20/2012 1026   MONOABS 0.7 12/20/2012 1026   EOSABS 0.1 12/20/2012 1026   BASOSABS 0.0 12/20/2012 1026          Assessment & Plan:

## 2013-04-10 NOTE — Assessment & Plan Note (Signed)
Will try albuterol inhaler and benzonate If no improvement in 2 weeks, can discuss with Dr. Reece AgarG about ? ACEI induced cough Continue claritin and flonase for now

## 2013-04-10 NOTE — Patient Instructions (Addendum)

## 2013-04-10 NOTE — Progress Notes (Signed)
Pre visit review using our clinic review tool, if applicable. No additional management support is needed unless otherwise documented below in the visit note. 

## 2013-04-23 ENCOUNTER — Encounter: Payer: Self-pay | Admitting: Family Medicine

## 2013-04-23 ENCOUNTER — Ambulatory Visit (INDEPENDENT_AMBULATORY_CARE_PROVIDER_SITE_OTHER): Payer: Medicare PPO | Admitting: Family Medicine

## 2013-04-23 VITALS — BP 138/86 | HR 82 | Temp 98.2°F | Wt 217.5 lb

## 2013-04-23 DIAGNOSIS — M545 Low back pain, unspecified: Secondary | ICD-10-CM

## 2013-04-23 DIAGNOSIS — R05 Cough: Secondary | ICD-10-CM

## 2013-04-23 DIAGNOSIS — R059 Cough, unspecified: Secondary | ICD-10-CM

## 2013-04-23 LAB — POCT URINALYSIS DIPSTICK
Bilirubin, UA: NEGATIVE
Glucose, UA: NEGATIVE
Ketones, UA: NEGATIVE
NITRITE UA: NEGATIVE
Spec Grav, UA: 1.02
UROBILINOGEN UA: 0.2
pH, UA: 5

## 2013-04-23 MED ORDER — ALBUTEROL SULFATE HFA 108 (90 BASE) MCG/ACT IN AERS: 2.0000 | INHALATION_SPRAY | Freq: Four times a day (QID) | RESPIRATORY_TRACT | Status: DC | PRN

## 2013-04-23 MED ORDER — BENZONATATE 200 MG PO CAPS: 200.0000 mg | ORAL_CAPSULE | Freq: Two times a day (BID) | ORAL | Status: DC | PRN

## 2013-04-23 NOTE — Assessment & Plan Note (Signed)
Looks to have a prolonged post infectious cough with a recently superimposed viral process with swallowed phlegm likely causing GI upset.  Should improve, would continue current meds for cough.  Nontoxic.  D/w pt. She agrees. She has no urinary sx, but with the UA I would check a culture for completeness.  She agrees.  Routed to PCP as FYI.

## 2013-04-23 NOTE — Progress Notes (Signed)
Pre visit review using our clinic review tool, if applicable. No additional management support is needed unless otherwise documented below in the visit note.  Cough for the last 3 months.  Some wheeze.  Better with tessalon and SABA.  Overall slowly getting better.  On ACE, d/w pt. This seems not to be related to the ACE.   Now recently with additional sx.  Recently with vomiting some phlegm.  Some rhinorrhea.  No sig post nasal gtt noted by patient.  Fevers and chills in the last few days.  No dysuria.  No h/o renal stones.  S/p hysterectomy.  Some lower back pain noted prev, was mild at the time.  Better now.  No abd pain now.  Sour taste in mouth.  HA noted.  No diarrhea.   Reports going on disability for her back years ago.  Needed forms done, will defer to PCP.   Meds, vitals, and allergies reviewed.   ROS: See HPI.  Otherwise, noncontributory.  GEN: nad, alert and oriented HEENT: mucous membranes moist, tm w/o erythema, nasal exam w/o erythema, clear discharge noted,  OP wnl, sinuses not ttp NECK: supple w/o LA CV: rrr.   PULM: totally ctab, no inc wob EXT: no edema ABD: soft, not ttp, no suprapubic pain, no CVA pain B

## 2013-04-23 NOTE — Patient Instructions (Signed)
We'll contact you with your lab report. Use the cough medicine and inhaler for now.  This should gradually improve soon.  The preexisting cough should slowly resolve.  Take care.

## 2013-04-24 LAB — URINE CULTURE
Colony Count: NO GROWTH
Organism ID, Bacteria: NO GROWTH

## 2013-04-26 ENCOUNTER — Telehealth: Payer: Self-pay | Admitting: Family Medicine

## 2013-04-26 NOTE — Telephone Encounter (Signed)
H/o lumbar DDD, chronic OA in knees Received request to fill out 2 handicap placards - plz call pt to inquire as to specific reason for disability and then fill out (placed in Kims' box). Also, will only fill oout one for her not both she brings. thanks

## 2013-04-30 NOTE — Telephone Encounter (Signed)
Signed and placed in Kim's box. 

## 2013-04-30 NOTE — Telephone Encounter (Signed)
Called the only number we have listed for pt.  Voicemail not set up.  Unable to leave message.

## 2013-04-30 NOTE — Telephone Encounter (Signed)
Pt left v/m returning call and request cb; pt did not leave contact #.

## 2013-04-30 NOTE — Telephone Encounter (Signed)
Pt says she needs handicap placard because she is unable to walk very far without having to stop.  I informed her that we only need to fill out 1 form and that I marked the box for her to get 2 placards since she has 2 cars per her request.  Form filled out and placed in Dr. Timoteo ExposeG's inbox.

## 2013-05-05 ENCOUNTER — Other Ambulatory Visit: Payer: Self-pay | Admitting: Family Medicine

## 2013-05-05 NOTE — Telephone Encounter (Signed)
Gave pt handi-cap form.

## 2013-05-05 NOTE — Telephone Encounter (Signed)
Received refill request electronically.  Last refill 01/17/13 #30/3 refills, last office visit 04/23/13/acute. Is it okay to refill medication?

## 2013-05-19 ENCOUNTER — Encounter: Payer: Self-pay | Admitting: Internal Medicine

## 2013-05-19 ENCOUNTER — Ambulatory Visit (INDEPENDENT_AMBULATORY_CARE_PROVIDER_SITE_OTHER): Payer: Medicare PPO | Admitting: Internal Medicine

## 2013-05-19 VITALS — BP 120/70 | HR 96 | Temp 98.6°F | Wt 216.0 lb

## 2013-05-19 DIAGNOSIS — K5289 Other specified noninfective gastroenteritis and colitis: Secondary | ICD-10-CM | POA: Diagnosis not present

## 2013-05-19 DIAGNOSIS — K529 Noninfective gastroenteritis and colitis, unspecified: Secondary | ICD-10-CM | POA: Insufficient documentation

## 2013-05-19 MED ORDER — ONDANSETRON HCL 4 MG PO TABS: 4.0000 mg | ORAL_TABLET | Freq: Three times a day (TID) | ORAL | Status: DC | PRN

## 2013-05-19 MED ORDER — PROMETHAZINE HCL 25 MG/ML IJ SOLN
25.0000 mg | Freq: Once | INTRAMUSCULAR | Status: AC
  Administered 2013-05-19: 25 mg via INTRAMUSCULAR

## 2013-05-19 NOTE — Progress Notes (Signed)
Subjective:    Patient ID: Michelle Mcgrath, female    DOB: 01/24/1951, 63 y.o.   MRN: [id number]  HPI Here with husband---[place]-- started 2 days ago Cut trip short  Pain in LLQ around to back Nausea and vomiting with any food or drink Bad diarrhea---imodium didn't help and she was sick from that also Home Depot[employer] and green liquidy stool every 30-60 minutes--even at night No blood in stool  Has had some vomiting after eating for a few months "my food will just not digest"  No fever but felt warm  Current Outpatient Prescriptions on File Prior to Visit  Medication Sig Dispense Refill  . acetaminophen (TYLENOL) 500 MG tablet Take 500 mg by mouth every 6 (six) hours as needed for pain.      Marland Kitchen. albuterol (PROVENTIL HFA;VENTOLIN HFA) 108 (90 BASE) MCG/ACT inhaler Inhale 2 puffs into the lungs every 6 (six) hours as needed for wheezing or shortness of breath.  1 Inhaler  1  . aspirin 81 MG tablet Take 81 mg by mouth daily.      . benzonatate (TESSALON) 200 MG capsule Take 1 capsule (200 mg total) by mouth 2 (two) times daily as needed for cough.  20 capsule  1  . buPROPion (WELLBUTRIN SR) 150 MG 12 hr tablet Take 150 mg by mouth 2 (two) times daily.      . enalapril (VASOTEC) 20 MG tablet Take 20 mg by mouth daily.      . fluticasone (FLONASE) 50 MCG/ACT nasal spray Place 2 sprays into both nostrils daily.  16 g  3  . hydrochlorothiazide (HYDRODIURIL) 25 MG tablet Take 25 mg by mouth daily.      Marland Kitchen. loratadine (CLARITIN) 10 MG tablet Take 10 mg by mouth daily.      Marland Kitchen. lovastatin (MEVACOR) 40 MG tablet Take 40 mg by mouth at bedtime.      Marland Kitchen. omeprazole (PRILOSEC) 40 MG capsule Take 40 mg by mouth daily.      Marland Kitchen. rOPINIRole (REQUIP) 1 MG tablet TAKE 1 TABLET (1 MG TOTAL) BY MOUTH AT BEDTIME.  30 tablet  3  . sertraline (ZOLOFT) 25 MG tablet Take 1 tablet (25 mg total) by mouth daily.  30 tablet  6   No current facility-administered  medications on file prior to visit.    Allergies  Allergen Reactions  . Gabapentin Other (See Comments)    oversedation  . Lyrica [Pregabalin] Other (See Comments)    Swelling and general fatigue    Past Medical History  Diagnosis Date  . Fibromyalgia     no mention of this from prior pcp  . Depression   . GERD (gastroesophageal reflux disease)   . History of stomach ulcers   . Allergic rhinitis     environmental  . Hypertension   . HLD (hyperlipidemia)   . History of migraine     none since better BP control  . History of rheumatic fever   . RLS (restless legs syndrome)     on gabapentin  . OSA (obstructive sleep apnea)     sleep study 2010, did not tolerate CPAP   . Osteoarthritis     s/p knee injections  . PVD (posterior vitreous detachment)   . Chronic lower back pain     MRI with annular tear L4/5 with disc protrusion s/p 1 ESI did not tolerate ([facility])  . History of hydronephrosis  left with ARF, ?due to pelvic organ prolapse,resolved  . Obesity 03/07/2013  . DDD (degenerative disc disease), lumbar     Past Surgical History  Procedure Laterality Date  . Total abdominal hysterectomy  2012    bladder drop  . Tonsillectomy and adenoidectomy  1992  . Colonoscopy  03/2006    few diverticula [facility])  . Esophagogastroduodenoscopy  03/2006    HH, erythematous gastropathy [facility])  . Cardiovascular stress test  2008     [facility])  . Sleep study  04/2011    AHI 64.6, severe OSA, improved with CPAP 17cm H2O    Family History  Problem Relation Age of Onset  . Hypertension Son   . Arthritis Son   . CAD Neg Hx   . Stroke Neg Hx   . Diabetes Neg Hx   . Cancer Son     bladder (smoker)    History   Social History  . Marital Status: Single    Spouse Name: N/A    Number of Children: N/A  . Years of Education: N/A   Occupational History  . Not on file.   Social History Main Topics  . Smoking status: Never Smoker   . Smokeless tobacco: Never Used    . Alcohol Use: No  . Drug Use: No  . Sexual Activity: Not on file   Other Topics Concern  . Not on file   Social History Narrative   Lives with fiance, 1 dog   Grown children (2 sons, youngest died with bladder cancer)   Occ: disability for FM, lower back pain, depression   Edu: 8th grade   Activity: no regular exercise   Diet:    Review of Systems Is on cough med and inhaler for breathing No dysuria or hematuria    Objective:   Physical Exam  Constitutional: She appears well-developed. No distress.  Appears mildly ill and uncomfortable  HENT:  Mouth/Throat: Oropharynx is clear and moist.  Neck: Normal range of motion. Neck supple.  Cardiovascular: Normal rate and regular rhythm.   Pulmonary/Chest: Effort normal and breath sounds normal. No respiratory distress. She has no wheezes. She has no rales.  Abdominal: Soft. Bowel sounds are normal. She exhibits no distension. There is no rebound and no guarding.  Scattered very mild areas of tenderness---nothing striking  Lymphadenopathy:    She has no cervical adenopathy.          Assessment & Plan:

## 2013-05-19 NOTE — Addendum Note (Signed)
Addended by: Sueanne MargaritaSMITH, Mea Ozga L on: 05/19/2013 05:18 PM   Modules accepted: Orders

## 2013-05-19 NOTE — Patient Instructions (Signed)
Please don't use imodium. Start pedialyte --- take 1 ounce every 10-15 minutes. If you tolerate that, you can increase to 2 ounces at a time. You can reintroduce bland foods once the nausea has gone away.

## 2013-05-19 NOTE — Assessment & Plan Note (Signed)
No appreciable dehydration No evidence of gallbaldder or diverticular disease Will give antiemetics Discussed pedialyte

## 2013-05-19 NOTE — Progress Notes (Signed)
Pre visit review using our clinic review tool, if applicable. No additional management support is needed unless otherwise documented below in the visit note. 

## 2013-05-20 ENCOUNTER — Telehealth: Payer: Self-pay | Admitting: Family Medicine

## 2013-05-20 NOTE — Telephone Encounter (Signed)
recommend fill zofran to take for nausea/vomiting as it should help with symptoms.  Does she have someone to pick up prescription for her? Continue small sips of fluids throughout the day If not better with zofran or persistent fever or worsening pain, rec come in for re evaluation.

## 2013-05-20 NOTE — Telephone Encounter (Signed)
Patient Information:  Caller Name: Michelle Mcgrath  Phone: [phone]  Patient: Michelle Mcgrath, Michelle Mcgrath  Gender: Female  DOB: 09/30/1950  Age: 63 Years  PCP: Tillman Abide[place]vak , Richard [facility](Family Practice)  Office Follow Up:  Does the office need to follow up with this patient?: Yes  Instructions For The Office: Patient reports she is afraid to go out of the house due to possible incontinent stools. She was evaluated 05/19/13.  Please follow up regarding any further instructions.   Symptoms  Reason For Call & Symptoms: Patient calling about vomiting and diarrhea.  Diarrhea is green. She is on Pedialyte; emesis is the color of what she drinks.  Estimates 2 episodes of vomiting and multiple diarrhea stools, with 3 within 30 minutes.  Last voided at 08:20.  Subjective fever on 05/17/13 - chills.  She was seen in office on 4/13 with temporary improvement after medication at office.  See Today in Office per Vomiting guideline due to Vomiting lasts > 48 hours.  Go to Office Now per Diarrhea guideline due to Age > 60 years and has had > 6 diarrhea stools in past 24 hours.  She was evaluated 05/19/13 and relates stools have not changed since seen.  She feels no different except that she is stooling so frequently she is afraid of having accident.  .  She has not had Rx for Ondansetron filled.  Reviewed Health History In EMR: Yes  Reviewed Medications In EMR: Yes  Reviewed Allergies In EMR: Yes  Reviewed Surgeries / Procedures: Yes  Date of Onset of Symptoms: 05/17/2013  Treatments Tried: Pedialyte  Treatments Tried Worked: No  Guideline(s) Used:  Vomiting  Diarrhea  Disposition Per Guideline:   Go to Office Now  Reason For Disposition Reached:   Age > 60 years and has had > 6 diarrhea stools in past 24 hours  Advice Given:  Reassurance:  Adults with vomiting need to stay hydrated. This is the most important thing. If you don't drink and replace lost fluids, you may get dehydrated.  Here is some care advice that should  help.  Clear Liquids:  Sip water or a rehydration drink (e.g., Gatorade or Powerade).  Other options: 1/2 strength flat lemon-lime soda or ginger ale.  After 4 hours without vomiting, increase the amount.  Avoid Nonprescription Medicines:  Stop all nonprescription medicines for 24 hours (Reason: they may make vomiting worse).  Call if vomiting a prescription medicine.  Expected Course:  Vomiting from viral gastritis usually stops in 12 to 48 hours.  If diarrhea is present, it usually lasts for several days.  Call Back If:  Signs of dehydration occur  You become worse.  Reassurance:  In healthy adults, new-onset diarrhea is usually caused by a viral infection of the intestines, which you can treat at home. Diarrhea is the body's way of getting rid of the infection. Here are some tips on how to keep ahead of the fluid losses.  Fluids:  Drink more fluids, at least 8-10 glasses (8 oz or 240 ml) daily.  Supplement this with saltine crackers or soups to make certain that you are getting sufficient fluid and salt to meet your body's needs.  Avoid caffeinated beverages (Reason: caffeine is mildly dehydrating).  Nutrition:  Maintaining some food intake during episodes of diarrhea is important.  Ideal initial foods include boiled starches/cereals (e.g., potatoes, rice, noodles, wheat, oats) with a small amount of salt to taste.  As your stools return to normal consistency, resume a normal diet.  Expected Course:  Viral diarrhea lasts 4-7 days. Always worse on days 1 and 2.  Call Back If:  Signs of dehydration occur (e.g., no urine for more than 12 hours, very dry mouth, lightheaded, etc.)  Diarrhea lasts over 7 days  You become worse.  RN Overrode Recommendation:  Document Patient  Patient reports she is afraid to go out of the house due to possible incontinent stools. She was evaluated 05/19/13.  Please follow up regarding any further instructions.

## 2013-05-20 NOTE — Telephone Encounter (Signed)
Patient notified and her husband was actually on the way to pick up Rx when I spoke to her. She verbalized understanding.

## 2013-06-09 ENCOUNTER — Ambulatory Visit (INDEPENDENT_AMBULATORY_CARE_PROVIDER_SITE_OTHER): Payer: Medicare PPO | Admitting: Family Medicine

## 2013-06-09 ENCOUNTER — Encounter: Payer: Self-pay | Admitting: Family Medicine

## 2013-06-09 VITALS — BP 122/78 | HR 84 | Temp 98.9°F | Wt 213.8 lb

## 2013-06-09 DIAGNOSIS — R109 Unspecified abdominal pain: Secondary | ICD-10-CM

## 2013-06-09 LAB — POCT URINALYSIS DIPSTICK
Bilirubin, UA: NEGATIVE
GLUCOSE UA: NEGATIVE
Ketones, UA: NEGATIVE
LEUKOCYTES UA: NEGATIVE
Nitrite, UA: NEGATIVE
PH UA: 6
Protein, UA: NEGATIVE
Spec Grav, UA: 1.02
Urobilinogen, UA: 0.2

## 2013-06-09 NOTE — Assessment & Plan Note (Addendum)
Not consistent with gallbladder etiology. ?diverticulitis vs gastritis/PUD.   Anticipate more mild diverticulitis - check CBC, CMP today.  Check UA today as well. Will treat empirically with augmentin course. Pt agrees with plan. Red flags to seek urgent care discussed.  Nontoxic.    UA - contaminated. Poor sample.

## 2013-06-09 NOTE — Progress Notes (Signed)
Pre visit review using our clinic review tool, if applicable. No additional management support is needed unless otherwise documented below in the visit note. 

## 2013-06-09 NOTE — Patient Instructions (Signed)
I think you have mild case of diverticulitis.  Treat with augmentin course for next 10 days.   Clear liquid diet for next 1-2 days then slowly advance diet. Let us know if not improving, or sooner if any worsening abdominal pain, fever >101, or worsening nausea/vomiting or any blood in stool.  Diverticulitis A diverticulum is a small pouch or sac on the colon. Diverticulosis is the presence of these diverticula on the colon. Diverticulitis is the irritation (inflammation) or infection of diverticula. CAUSES  The colon and its diverticula contain bacteria. If food particles block the tiny opening to a diverticulum, the bacteria inside can grow and cause an increase in pressure. This leads to infection and inflammation and is called diverticulitis. SYMPTOMS   Abdominal pain and tenderness. Usually, the pain is located on the left side of your abdomen. However, it could be located elsewhere.  Fever.  Bloating.  Feeling sick to your stomach (nausea).  Throwing up (vomiting).  Abnormal stools. DIAGNOSIS  Your caregiver will take a history and perform a physical exam. Since many things can cause abdominal pain, other tests may be necessary. Tests may include:  Blood tests.  Urine tests.  X-ray of the abdomen.  CT scan of the abdomen. Sometimes, surgery is needed to determine if diverticulitis or other conditions are causing your symptoms. TREATMENT  Most of the time, you can be treated without surgery. Treatment includes:  Resting the bowels by only having liquids for a few days. As you improve, you will need to eat a low-fiber diet.  Intravenous (IV) fluids if you are losing body fluids (dehydrated).  Antibiotic medicines that treat infections may be given.  Pain and nausea medicine, if needed.  Surgery if the inflamed diverticulum has burst. HOME CARE INSTRUCTIONS   Try a clear liquid diet (broth, tea, or water for as long as directed by your caregiver). You may then  gradually begin a low-fiber diet as tolerated.  A low-fiber diet is a diet with less than 10 grams of fiber. Choose the foods below to reduce fiber in the diet:  White breads, cereals, rice, and pasta.  Cooked fruits and vegetables or soft fresh fruits and vegetables without the skin.  Ground or well-cooked tender beef, ham, veal, lamb, pork, or poultry.  Eggs and seafood.  After your diverticulitis symptoms have improved, your caregiver may put you on a high-fiber diet. A high-fiber diet includes 14 grams of fiber for every 1000 calories consumed. For a standard 2000 calorie diet, you would need 28 grams of fiber. Follow these diet guidelines to help you increase the fiber in your diet. It is important to slowly increase the amount fiber in your diet to avoid gas, constipation, and bloating.  Choose whole-grain breads, cereals, pasta, and brown rice.  Choose fresh fruits and vegetables with the skin on. Do not overcook vegetables because the more vegetables are cooked, the more fiber is lost.  Choose more nuts, seeds, legumes, dried peas, beans, and lentils.  Look for food products that have greater than 3 grams of fiber per serving on the Nutrition Facts label.  Take all medicine as directed by your caregiver.  If your caregiver has given you a follow-up appointment, it is very important that you go. Not going could result in lasting (chronic) or permanent injury, pain, and disability. If there is any problem keeping the appointment, call to reschedule. SEEK MEDICAL CARE IF:   Your pain does not improve.  You have a hard time advancing  your diet beyond clear liquids.  Your bowel movements do not return to normal. SEEK IMMEDIATE MEDICAL CARE IF:   Your pain becomes worse.  You have an oral temperature above 102 F (38.9 C), not controlled by medicine.  You have repeated vomiting.  You have bloody or black, tarry stools.  Symptoms that brought you to your caregiver become  worse or are not getting better. MAKE SURE YOU:   Understand these instructions.  Will watch your condition.  Will get help right away if you are not doing well or get worse. Document Released: 11/02/2004 Document Revised: 04/17/2011 Document Reviewed: 02/28/2010 [facility] Patient Information 2014 [place], [place].

## 2013-06-09 NOTE — Progress Notes (Addendum)
BP 122/78  Pulse 84  Temp(Src) 98.9 F (37.2 C) (Oral)  Wt 213 lb 12 oz (96.956 kg)  SpO2 97%   CC: abd discomfort  Subjective:    Patient ID: Michelle Mcgrath, female    DOB: 06/20/1950, 63 y.o.   MRN: [id number]  HPI: Michelle ParaDale E Buchheit is a 63 y.o. female presenting on 06/09/2013 for Nausea, Emesis and Abdominal Pain   See prior note for details - seen here 05/19/2013 by Dr. Alphonsus SiasLetvak with dx viral gastroenteritis.  Had diarrhea for 5 days.  This resolved. Now over last 5 days having LLQ pain with vomiting, lower back pain.  Started Friday.  Took husband for his birthday to a restaurant - steak on Thursday night.  Notices worse pain with any greasy foods.  Currently mild ache (L sided abdomen).  Always L lower sided pain that radiates to L flank.  Now having lower back pain as well.  + nausea/vomiting (brown emesis).  Took ondansetron which causes mild constipation.  No fevers/chills, blood in stool, dysuria, frequency or hematuria.  + urgency over weekend.  Initial diarrheal episodes with abd pain on recent [place] moon (marriage 05/10/2013).  Last ate a fish buffet.  ?food poisoning.  Actually this feels somewhat like her previous episode of diverticulitis that led to hospitalization 2009.  Compliant with omeprazole 40mg  once daily. COLONOSCOPY Date: 03/2006 few diverticula [facility])  ESOPHAGOGASTRODUODENOSCOPY Date: 03/2006 HH, erythematous gastropathy ([facility])  Lives with husband, 1 dog Grown children (2 sons, youngest died with bladder cancer) Occ: disability for FM, lower back pain, depression Edu: 8th grade  Relevant past medical, surgical, family and social history reviewed and updated as indicated.  Allergies and medications reviewed and updated. Current Outpatient Prescriptions on File Prior to Visit  Medication Sig  . acetaminophen (TYLENOL) 500 MG tablet Take 500 mg by mouth every 6 (six) hours as needed for pain.  Marland Kitchen. albuterol (PROVENTIL HFA;VENTOLIN HFA) 108 (90 BASE) MCG/ACT inhaler Inhale 2  puffs into the lungs every 6 (six) hours as needed for wheezing or shortness of breath.  Marland Kitchen. aspirin 81 MG tablet Take 81 mg by mouth daily.  Marland Kitchen. buPROPion (WELLBUTRIN SR) 150 MG 12 hr tablet Take 150 mg by mouth 2 (two) times daily.  . enalapril (VASOTEC) 20 MG tablet Take 20 mg by mouth daily.  . fluticasone (FLONASE) 50 MCG/ACT nasal spray Place 2 sprays into both nostrils daily.  . hydrochlorothiazide (HYDRODIURIL) 25 MG tablet Take 25 mg by mouth daily.  Marland Kitchen. loratadine (CLARITIN) 10 MG tablet Take 10 mg by mouth daily.  Marland Kitchen. lovastatin (MEVACOR) 40 MG tablet Take 40 mg by mouth at bedtime.  Marland Kitchen. omeprazole (PRILOSEC) 40 MG capsule Take 40 mg by mouth daily.  Marland Kitchen. rOPINIRole (REQUIP) 1 MG tablet TAKE 1 TABLET (1 MG TOTAL) BY MOUTH AT BEDTIME.  Marland Kitchen. sertraline (ZOLOFT) 25 MG tablet Take 1 tablet (25 mg total) by mouth daily.   No current facility-administered medications on file prior to visit.    Review of Systems Per HPI unless specifically indicated above    Objective:    BP 122/78  Pulse 84  Temp(Src) 98.9 F (37.2 C) (Oral)  Wt 213 lb 12 oz (96.956 kg)  SpO2 97%  Physical Exam  Nursing note and vitals reviewed. Constitutional: She appears well-developed and well-nourished. No distress.  HENT:  Mouth/Throat: Oropharynx is clear and moist. No oropharyngeal exudate.  Cardiovascular: Normal rate, regular rhythm, normal heart sounds and intact distal pulses.   No murmur heard.  Pulmonary/Chest: Effort normal and breath sounds normal. No respiratory distress. She has no wheezes. She has no rales.  Abdominal: Soft. Normal appearance and bowel sounds are normal. She exhibits no distension and no mass. There is no hepatosplenomegaly. There is tenderness (mild) in the epigastric area. There is no rigidity, no rebound, no guarding, no CVA tenderness and negative Murphy's sign.  Musculoskeletal: She exhibits no edema.       Assessment & Plan:   Problem List Items Addressed This Visit   Left sided  abdominal pain - Primary     Not consistent with gallbladder etiology. ?diverticulitis vs gastritis/PUD.   Anticipate more mild diverticulitis - check CBC, CMP today.  Check UA today as well. Will treat empirically with augmentin course. Pt agrees with plan. Red flags to seek urgent care discussed.  Nontoxic.    UA - contaminated. Poor sample.    Relevant Orders      Comprehensive metabolic panel (Completed)      CBC with Differential (Completed)      Lipase (Completed)      POCT urinalysis dipstick (Completed)       Follow up plan: Return if symptoms worsen or fail to improve.

## 2013-06-10 LAB — COMPREHENSIVE METABOLIC PANEL
ALT: 21 U/L (ref 0–35)
AST: 19 U/L (ref 0–37)
Albumin: 4 g/dL (ref 3.5–5.2)
Alkaline Phosphatase: 65 U/L (ref 39–117)
BILIRUBIN TOTAL: 0.4 mg/dL (ref 0.2–1.2)
BUN: 21 mg/dL (ref 6–23)
CALCIUM: 9.5 mg/dL (ref 8.4–10.5)
CHLORIDE: 104 meq/L (ref 96–112)
CO2: 27 meq/L (ref 19–32)
Creatinine, Ser: 0.9 mg/dL (ref 0.4–1.2)
GFR: 63.5 mL/min (ref >60.00)
GLUCOSE: 99 mg/dL (ref 70–99)
Potassium: 3.8 mEq/L (ref 3.5–5.1)
Sodium: 140 mEq/L (ref 135–145)
Total Protein: 7 g/dL (ref 6.0–8.3)

## 2013-06-10 LAB — CBC WITH DIFFERENTIAL/PLATELET
BASOS ABS: 0 10*3/uL (ref 0.0–0.1)
Basophils Relative: 0.2 % (ref 0.0–3.0)
EOS PCT: 0.6 % (ref 0.0–5.0)
Eosinophils Absolute: 0.1 10*3/uL (ref 0.0–0.7)
HEMATOCRIT: 41.7 % (ref 36.0–46.0)
HEMOGLOBIN: 14 g/dL (ref 12.0–15.0)
LYMPHS ABS: 1.9 10*3/uL (ref 0.7–4.0)
LYMPHS PCT: 20.8 % (ref 12.0–46.0)
MCHC: 33.6 g/dL (ref 30.0–36.0)
MCV: 86.2 fl (ref 78.0–100.0)
Monocytes Absolute: 0.7 10*3/uL (ref 0.1–1.0)
Monocytes Relative: 7.1 % (ref 3.0–12.0)
NEUTROS ABS: 6.6 10*3/uL (ref 1.4–7.7)
Neutrophils Relative %: 71.3 % (ref 43.0–77.0)
Platelets: 175 10*3/uL (ref 150.0–400.0)
RBC: 4.84 Mil/uL (ref 3.87–5.11)
RDW: 13.7 % (ref 11.5–15.5)
WBC: 9.2 10*3/uL (ref 4.0–10.5)

## 2013-06-10 LAB — LIPASE: LIPASE: 33 U/L (ref 11.0–59.0)

## 2013-06-11 ENCOUNTER — Other Ambulatory Visit: Payer: Self-pay | Admitting: Family Medicine

## 2013-06-11 MED ORDER — AMOXICILLIN-POT CLAVULANATE 875-125 MG PO TABS: 1.0000 | ORAL_TABLET | Freq: Two times a day (BID) | ORAL | Status: DC

## 2013-06-30 ENCOUNTER — Other Ambulatory Visit: Payer: Self-pay | Admitting: Family Medicine

## 2013-08-14 ENCOUNTER — Ambulatory Visit (INDEPENDENT_AMBULATORY_CARE_PROVIDER_SITE_OTHER): Payer: Medicare PPO | Admitting: Family Medicine

## 2013-08-14 ENCOUNTER — Ambulatory Visit (INDEPENDENT_AMBULATORY_CARE_PROVIDER_SITE_OTHER)
Admission: RE | Admit: 2013-08-14 | Discharge: 2013-08-14 | Disposition: A | Discharge status: Home / Self Care | Payer: Medicare PPO | Source: Ambulatory Visit | Attending: Family Medicine | Admitting: Family Medicine

## 2013-08-14 ENCOUNTER — Encounter: Payer: Self-pay | Admitting: Family Medicine

## 2013-08-14 VITALS — BP 122/76 | HR 83 | Temp 98.4°F | Wt 222.2 lb

## 2013-08-14 DIAGNOSIS — M79671 Pain in right foot: Secondary | ICD-10-CM | POA: Insufficient documentation

## 2013-08-14 DIAGNOSIS — M79609 Pain in unspecified limb: Secondary | ICD-10-CM

## 2013-08-14 DIAGNOSIS — M25561 Pain in right knee: Secondary | ICD-10-CM | POA: Insufficient documentation

## 2013-08-14 DIAGNOSIS — M25569 Pain in unspecified knee: Secondary | ICD-10-CM

## 2013-08-14 MED ORDER — NAPROXEN 500 MG PO TABS: ORAL_TABLET | ORAL | Status: DC

## 2013-08-14 NOTE — Assessment & Plan Note (Signed)
Anticipate pes anserine bursitis, less likely MCL/medial meniscal injury as no pain with valgus testing or with mcmurray's. Treat with naprosyn 500mg  bid x 5 days then prn, ice, brace, rest. Update if not improving with above treatment plan. rec sleep with pillow between legs and avoid sitting cross legged.

## 2013-08-14 NOTE — Patient Instructions (Addendum)
Xray of right foot today to help rule out fracture. I think you have bursitis of right knee - do stretching exercises provided and buy knee sleeve brace. Use ice to knee. Start naprosyn 500mg  twice daily with food for next 5 days then as needed for pain. Good to see you today, return in 3-4 months for medicare wellness visit, prior fasting for blood work.

## 2013-08-14 NOTE — Progress Notes (Signed)
BP 122/76  Pulse 83  Temp(Src) 98.4 F (36.9 C) (Oral)  Wt 222 lb 4 oz (100.812 kg)  SpO2 95%   CC: joint pains  Subjective:    Patient ID: Michelle Mcgrath, female    DOB: 1950/08/12, 63 y.o.   MRN: [id number]  HPI: KRISTILYN COLTRANE is a 63 y.o. female presenting on 08/14/2013 for Knee Pain, Foot Pain, Ankle Pain and Arm Pain   Endorses 1+ mo h/o R foot pain at lateral 3 toes. Also endorses R anterior knee pain worse with stairs. Denies inciting trauma or falls. Has been working in garden much more recently - getting down on knees. Also notices stiffness with prolonged sitting. Denies locking of knee. Some instability. No pain at great toe. No pain at L foot. Denies hip or back pain. No h/o gout. So far has been taking [facility] tylenol which isn't really helping. H/o DDD and DJD. ?h/o FM  Relevant past medical, surgical, family and social history reviewed and updated as indicated.  Allergies and medications reviewed and updated. Current Outpatient Prescriptions on File Prior to Visit  Medication Sig  . acetaminophen (TYLENOL) 500 MG tablet Take 500 mg by mouth every 6 (six) hours as needed for pain.  Marland Kitchen albuterol (PROVENTIL HFA;VENTOLIN HFA) 108 (90 BASE) MCG/ACT inhaler Inhale 2 puffs into the lungs every 6 (six) hours as needed for wheezing or shortness of breath.  Marland Kitchen amoxicillin-clavulanate (AUGMENTIN) 875-125 MG per tablet Take 1 tablet by mouth 2 (two) times daily.  Marland Kitchen aspirin 81 MG tablet Take 81 mg by mouth daily.  Marland Kitchen buPROPion (WELLBUTRIN SR) 150 MG 12 hr tablet Take 150 mg by mouth 2 (two) times daily.  . enalapril (VASOTEC) 20 MG tablet Take 20 mg by mouth daily.  . fluticasone (FLONASE) 50 MCG/ACT nasal spray Place 2 sprays into both nostrils daily.  . hydrochlorothiazide (HYDRODIURIL) 25 MG tablet Take 25 mg by mouth daily.  Marland Kitchen loratadine (CLARITIN) 10 MG tablet Take 10 mg by mouth daily.  Marland Kitchen lovastatin (MEVACOR) 40 MG tablet Take 40 mg by mouth at bedtime.  Marland Kitchen omeprazole (PRILOSEC) 40 MG  capsule Take 40 mg by mouth daily.  Marland Kitchen rOPINIRole (REQUIP) 1 MG tablet TAKE 1 TABLET (1 MG TOTAL) BY MOUTH AT BEDTIME.  Marland Kitchen sertraline (ZOLOFT) 25 MG tablet TAKE 1 TABLET (25 MG TOTAL) BY MOUTH DAILY.   No current facility-administered medications on file prior to visit.    Review of Systems Per HPI unless specifically indicated above    Objective:    BP 122/76  Pulse 83  Temp(Src) 98.4 F (36.9 C) (Oral)  Wt 222 lb 4 oz (100.812 kg)  SpO2 95%  Physical Exam  Nursing note and vitals reviewed. Constitutional: She appears well-developed and well-nourished. No distress.  Musculoskeletal: She exhibits edema (nonpitting).  2+DP bilat L knee WNL R Knee exam: No deformity on inspection. Pain with palpation of MCL, medial joint line, and pes anserine bursa No effusion/swelling noted. FROM in flex/extension without crepitus. No popliteal fullness. Neg drawer test. Neg mcmurray test. No pain with valgus/varus stress. No PFgrind. No abnormal patellar mobility.  L foot WNL R foot pain at 2nd mid MT shaft, no pain with axial loading of toes, no pain to palpation toes or 1st MTP. No pain or laxity with ankle ligament testing. No pain at base of 5th MT. No significant metatarsal collapse.  Skin: Skin is warm and dry. No rash noted.       Assessment & Plan:  Problem List Items Addressed This Visit   Right knee pain     Anticipate pes anserine bursitis, less likely MCL/medial meniscal injury as no pain with valgus testing or with mcmurray's. Treat with naprosyn 500mg  bid x 5 days then prn, ice, brace, rest. Update if not improving with above treatment plan. rec sleep with pillow between legs and avoid sitting cross legged.    Right foot pain - Primary     Check xray to r/p 2nd MT stress fracture. Placed in post op shoe for 2-3 weeks for MT rest. Naprosyn for discomfort. Update if not improved with this.    Relevant Orders      DG Foot Complete Right       Follow up  plan: Return in about 3 months (around 11/14/2013), or if symptoms worsen or fail to improve, for annual exam, prior fasting for blood work.

## 2013-08-14 NOTE — Assessment & Plan Note (Signed)
Check xray to r/p 2nd MT stress fracture. Placed in post op shoe for 2-3 weeks for MT rest. Naprosyn for discomfort. Update if not improved with this.

## 2013-08-14 NOTE — Progress Notes (Signed)
Pre visit review using our clinic review tool, if applicable. No additional management support is needed unless otherwise documented below in the visit note. 

## 2013-08-23 ENCOUNTER — Other Ambulatory Visit: Payer: Self-pay | Admitting: Family Medicine

## 2013-09-12 ENCOUNTER — Other Ambulatory Visit: Payer: Self-pay

## 2013-09-12 MED ORDER — LOVASTATIN 40 MG PO TABS: 40.0000 mg | ORAL_TABLET | Freq: Every day | ORAL | Status: DC

## 2013-09-12 MED ORDER — HYDROCHLOROTHIAZIDE 25 MG PO TABS: 25.0000 mg | ORAL_TABLET | Freq: Every day | ORAL | Status: DC

## 2013-09-12 NOTE — Telephone Encounter (Signed)
Pt request refill HCTZ and lovastatin; 12-05-2012 continue regimen. Advised pt refilled to [facility]. Pt will cb to schedule appt with labs.

## 2013-10-02 ENCOUNTER — Encounter: Payer: Self-pay | Admitting: Family Medicine

## 2013-10-02 ENCOUNTER — Ambulatory Visit (INDEPENDENT_AMBULATORY_CARE_PROVIDER_SITE_OTHER): Payer: Medicare PPO | Admitting: Family Medicine

## 2013-10-02 VITALS — BP 136/96 | HR 96 | Temp 98.2°F | Wt 233.5 lb

## 2013-10-02 DIAGNOSIS — G4733 Obstructive sleep apnea (adult) (pediatric): Secondary | ICD-10-CM

## 2013-10-02 DIAGNOSIS — E785 Hyperlipidemia, unspecified: Secondary | ICD-10-CM

## 2013-10-02 DIAGNOSIS — M79602 Pain in left arm: Secondary | ICD-10-CM | POA: Insufficient documentation

## 2013-10-02 DIAGNOSIS — M7989 Other specified soft tissue disorders: Secondary | ICD-10-CM

## 2013-10-02 DIAGNOSIS — E669 Obesity, unspecified: Secondary | ICD-10-CM

## 2013-10-02 MED ORDER — ENALAPRIL MALEATE 20 MG PO TABS: 20.0000 mg | ORAL_TABLET | Freq: Every day | ORAL | Status: DC

## 2013-10-02 NOTE — Progress Notes (Signed)
Pre visit review using our clinic review tool, if applicable. No additional management support is needed unless otherwise documented below in the visit note. 

## 2013-10-02 NOTE — Progress Notes (Signed)
BP 136/96  Pulse 96  Temp(Src) 98.2 F (36.8 C) (Oral)  Wt 233 lb 8 oz (105.915 kg)   CC: discuss sleep apnea  Subjective:    Patient ID: Michelle Mcgrath, female    DOB: 1950/05/31, 63 y.o.   MRN: [id number]  HPI: Michelle Mcgrath is a 63 y.o. female presenting on 10/02/2013 for Fatigue   Excessive daytime somnolence over last several years. Known h/o severe OSA, previously improved with CPAP at 17cm H2o but she has had difficulty tolerating this. She feels mask suffocates her. Family members tell her she snores and stops breathing. Scaring husband at night time.  Some night time water brash.  She does have machine but finds mask significantly bothers her. She has a mask that covers mouth and nose.   Sleep study Date: 04/2011 AHI 64.6, severe OSA, improved with CPAP 17cm H2O Previously saw Dr. Anselm Jungling internist at [facility] for sleep apnea.  11lb weight gain noted in last month.  Wt Readings from Last 3 Encounters:  10/02/13 233 lb 8 oz (105.915 kg)  08/14/13 222 lb 4 oz (100.812 kg)  06/09/13 213 lb 12 oz (96.956 kg)   Body mass index is 42 kg/(m^2).  Oh by the way... 2 mo h/o L arm pain worse with cold air. Sore to touch at anterior upper arm. Feeling tight as well. No chest pain or dyspnea. No radiculopathy or neck/shoulder pain. No numbness or weakness of arm. Naprosyn taken for previously R leg bursitis hasn't improved pain. Denies inciting trauma/injury. Pain not worse with exertion, not improved with rest. Actually better with exercise. Brother with CAD hx. No redness or fever at arm. No bug bites. No recent FLP. Never smoker. Ate this morning 5:30am - sausage biscuit.  Relevant past medical, surgical, family and social history reviewed and updated as indicated.  Allergies and medications reviewed and updated. Current Outpatient Prescriptions on File Prior to Visit  Medication Sig  . acetaminophen (TYLENOL) 500 MG tablet Take 500 mg by mouth every 6 (six) hours as  needed for pain.  Marland Kitchen albuterol (PROVENTIL HFA;VENTOLIN HFA) 108 (90 BASE) MCG/ACT inhaler Inhale 2 puffs into the lungs every 6 (six) hours as needed for wheezing or shortness of breath.  Marland Kitchen aspirin 81 MG tablet Take 81 mg by mouth daily.  Marland Kitchen buPROPion (WELLBUTRIN SR) 150 MG 12 hr tablet Take 150 mg by mouth 2 (two) times daily.  . fluticasone (FLONASE) 50 MCG/ACT nasal spray Place 2 sprays into both nostrils daily.  . hydrochlorothiazide (HYDRODIURIL) 25 MG tablet Take 1 tablet (25 mg total) by mouth daily.  Marland Kitchen loratadine (CLARITIN) 10 MG tablet Take 10 mg by mouth daily.  Marland Kitchen lovastatin (MEVACOR) 40 MG tablet Take 1 tablet (40 mg total) by mouth at bedtime.  . naproxen (NAPROSYN) 500 MG tablet Take one po bid x 1 week then prn pain, take with food  . omeprazole (PRILOSEC) 40 MG capsule Take 40 mg by mouth daily.  Marland Kitchen rOPINIRole (REQUIP) 1 MG tablet TAKE 1 TABLET (1 MG TOTAL) BY MOUTH AT BEDTIME.  Marland Kitchen sertraline (ZOLOFT) 25 MG tablet TAKE 1 TABLET (25 MG TOTAL) BY MOUTH DAILY.   No current facility-administered medications on file prior to visit.   Past Medical History  Diagnosis Date  . Fibromyalgia     no mention of this from prior pcp  . Depression   . GERD (gastroesophageal reflux disease)   . History of stomach ulcers   . Allergic rhinitis  environmental  . Hypertension   . HLD (hyperlipidemia)   . History of migraine     none since better BP control  . History of rheumatic fever   . RLS (restless legs syndrome)     on gabapentin  . OSA (obstructive sleep apnea)     sleep study 2013 w/ severe sleep apnea , did not tolerate CPAP   . Osteoarthritis     s/p knee injections  . PVD (posterior vitreous detachment)   . Chronic lower back pain     MRI with annular tear L4/5 with disc protrusion s/p 1 ESI did not tolerate ([facility])  . History of hydronephrosis     left with ARF, ?due to pelvic organ prolapse,resolved  . Obesity 03/07/2013  . DDD (degenerative disc disease),  lumbar   . Diverticulosis 2008    mild by CT 2008    Review of Systems Per HPI unless specifically indicated above    Objective:    BP 136/96  Pulse 96  Temp(Src) 98.2 F (36.8 C) (Oral)  Wt 233 lb 8 oz (105.915 kg)  Physical Exam  Nursing note and vitals reviewed. Constitutional: She appears well-developed and well-nourished. No distress.  HENT:  Mouth/Throat: Oropharynx is clear and moist. No oropharyngeal exudate.  Crowded oropharnyx   Eyes: Conjunctivae and EOM are normal. Pupils are equal, round, and reactive to light.  Neck: Normal range of motion. Neck supple. No thyromegaly present.  Cardiovascular: Normal rate, regular rhythm, normal heart sounds and intact distal pulses.   No murmur heard. Pulmonary/Chest: Effort normal and breath sounds normal. No respiratory distress. She has no wheezes. She has no rales.  Musculoskeletal: She exhibits no edema.  RUE arm circ 41.5cm LUE arm circ 41.5cm LUE at [facility] fossa some tightness noted to skin, mild erythema and tenderness to palpation of upper arm  Lymphadenopathy:    She has no cervical adenopathy.       Assessment & Plan:   Problem List Items Addressed This Visit   HLD (hyperlipidemia)   Relevant Medications      enalapril (VASOTEC) tablet   Other Relevant Orders      Lipid panel   Left arm swelling     Over last 2 months. ?tendonitis, doubt infection. Check D dimer to r/o UE DVT although no significant risk factors for this other than morbid obesity. Check CBC.    Relevant Orders      D-dimer, quantitative      CBC with Differential   Morbid obesity     Body mass index is 42 kg/(m^2).  Encouraged renewed efforts at weight loss through healthy diet and lifestyle chagnes.    OSA (obstructive sleep apnea) - Primary     Known severe OSA improved on CPAP at 17cm H2O but pt having trouble tolerating. rec retrial of CPAP machine/face mask she has at home and will refer to Pulm to establish and to discuss other CPAP  options available. Discussed importance of weight loss. Pt agrees.    Relevant Orders      Ambulatory referral to Pulmonology       Follow up plan: Return if symptoms worsen or fail to improve.

## 2013-10-02 NOTE — Assessment & Plan Note (Signed)
Body mass index is 42 kg/(m^2).  Encouraged renewed efforts at weight loss through healthy diet and lifestyle chagnes.

## 2013-10-02 NOTE — Patient Instructions (Addendum)
Try zantac  at night time for water brash. Continue prilosec  in the morning. Pass by Marion's office for referral to lung/sleep doctor to discuss other options for CPAP mask. Try to restart CPAP machine in the interim. Return tomorrow morning fasting for blood work. Work on [employer] by staying as active as possible during the day.

## 2013-10-02 NOTE — Assessment & Plan Note (Addendum)
Over last 2 months. ?tendonitis, doubt infection. Check D dimer to r/o UE DVT although no significant risk factors for this other than morbid obesity. Check CBC.

## 2013-10-02 NOTE — Assessment & Plan Note (Signed)
Known severe OSA improved on CPAP at 17cm H2O but pt having trouble tolerating. rec retrial of CPAP machine/face mask she has at home and will refer to Pulm to establish and to discuss other CPAP options available. Discussed importance of weight loss. Pt agrees.

## 2013-10-03 ENCOUNTER — Other Ambulatory Visit (INDEPENDENT_AMBULATORY_CARE_PROVIDER_SITE_OTHER): Payer: Medicare PPO

## 2013-10-03 DIAGNOSIS — E785 Hyperlipidemia, unspecified: Secondary | ICD-10-CM

## 2013-10-03 DIAGNOSIS — M7989 Other specified soft tissue disorders: Secondary | ICD-10-CM

## 2013-10-03 LAB — LIPID PANEL
Cholesterol: 168 mg/dL (ref 0–200)
HDL: 37.2 mg/dL — ABNORMAL LOW (ref >39.00)
LDL CALC: 107 mg/dL — AB (ref 0–99)
NonHDL: 130.8
Total CHOL/HDL Ratio: 5
Triglycerides: 119 mg/dL (ref 0.0–149.0)
VLDL: 23.8 mg/dL (ref 0.0–40.0)

## 2013-10-03 LAB — CBC WITH DIFFERENTIAL/PLATELET
Basophils Absolute: 0 10*3/uL (ref 0.0–0.1)
Basophils Relative: 0.4 % (ref 0.0–3.0)
Eosinophils Absolute: 0.1 10*3/uL (ref 0.0–0.7)
Eosinophils Relative: 1.3 % (ref 0.0–5.0)
HCT: 40 % (ref 36.0–46.0)
Hemoglobin: 13.4 g/dL (ref 12.0–15.0)
Lymphocytes Relative: 22.9 % (ref 12.0–46.0)
Lymphs Abs: 1.6 10*3/uL (ref 0.7–4.0)
MCHC: 33.6 g/dL (ref 30.0–36.0)
MCV: 85.4 fl (ref 78.0–100.0)
MONO ABS: 0.6 10*3/uL (ref 0.1–1.0)
Monocytes Relative: 8.4 % (ref 3.0–12.0)
NEUTROS PCT: 67 % (ref 43.0–77.0)
Neutro Abs: 4.8 10*3/uL (ref 1.4–7.7)
Platelets: 175 10*3/uL (ref 150.0–400.0)
RBC: 4.69 Mil/uL (ref 3.87–5.11)
RDW: 13.2 % (ref 11.5–15.5)
WBC: 7.2 10*3/uL (ref 4.0–10.5)

## 2013-10-04 ENCOUNTER — Other Ambulatory Visit: Payer: Self-pay | Admitting: Family Medicine

## 2013-10-04 LAB — D-DIMER, QUANTITATIVE (NOT AT ARMC): D DIMER QUANT: 0.29 ug{FEU}/mL (ref 0.00–0.48)

## 2013-10-04 MED ORDER — CEPHALEXIN 500 MG PO CAPS: 500.0000 mg | ORAL_CAPSULE | Freq: Three times a day (TID) | ORAL | Status: DC

## 2013-10-20 ENCOUNTER — Ambulatory Visit (INDEPENDENT_AMBULATORY_CARE_PROVIDER_SITE_OTHER): Payer: Medicare PPO | Admitting: Family Medicine

## 2013-10-20 ENCOUNTER — Encounter: Payer: Self-pay | Admitting: Family Medicine

## 2013-10-20 VITALS — BP 142/82 | HR 88 | Temp 97.7°F | Wt 234.5 lb

## 2013-10-20 DIAGNOSIS — M79609 Pain in unspecified limb: Secondary | ICD-10-CM

## 2013-10-20 DIAGNOSIS — M79602 Pain in left arm: Secondary | ICD-10-CM

## 2013-10-20 DIAGNOSIS — R600 Localized edema: Secondary | ICD-10-CM

## 2013-10-20 DIAGNOSIS — R609 Edema, unspecified: Secondary | ICD-10-CM

## 2013-10-20 DIAGNOSIS — Z23 Encounter for immunization: Secondary | ICD-10-CM

## 2013-10-20 NOTE — Progress Notes (Signed)
BP 142/82  Pulse 88  Temp(Src) 97.7 F (36.5 C) (Oral)  Wt 234 lb 8 oz (106.369 kg)   CC: foot swelling and L arm pain  Subjective:    Patient ID: Michelle Mcgrath, female    DOB: 09-Nov-1950, 63 y.o.   MRN: [id number]  HPI: Michelle Mcgrath is a 63 y.o. female presenting on 10/20/2013 for Foot Swelling and Arm Pain   Right handed.  See prior note for details - at that time seen here with L arm swelling and discomfort, s/p normal d dimer, treated for possible cellulitis with 7d keflex course without improvement. Worse pain when in car and [facility] hits arm - has to carry blanket with her when in a car. Describes pain that starts mid upper left arm (sore to touch) with radiation to dorsal hand. Denies paresthesias or burning or weakness of L hand. + neck pain and L shoulder pain as well. Improves with heating pad. Worse when lifting arm. No chest pain, not exertional. "feels strained". Tried naprosyn which helps some, rarely uses. Has taken steroid shots in past without trouble.   Leg swelling - R>L over last few months. Some numbness of right lateral lower leg. Denies dyspnea or cough.   Past Medical History  Diagnosis Date  . Fibromyalgia     no mention of this from prior pcp  . Depression   . GERD (gastroesophageal reflux disease)   . History of stomach ulcers   . Allergic rhinitis     environmental  . Hypertension   . HLD (hyperlipidemia)   . History of migraine     none since better BP control  . History of rheumatic fever   . RLS (restless legs syndrome)     on gabapentin  . OSA (obstructive sleep apnea)     sleep study 2013 w/ severe sleep apnea , did not tolerate CPAP   . Osteoarthritis     s/p knee injections  . PVD (posterior vitreous detachment)   . Chronic lower back pain     MRI with annular tear L4/5 with disc protrusion s/p 1 ESI did not tolerate ([facility])  . History of hydronephrosis     left with ARF, ?due to pelvic organ prolapse,resolved  . Obesity 03/07/2013    . DDD (degenerative disc disease), lumbar   . Diverticulosis 2008    mild by CT 2008     Relevant past medical, surgical, family and social history reviewed and updated as indicated.  Allergies and medications reviewed and updated. Current Outpatient Prescriptions on File Prior to Visit  Medication Sig  . acetaminophen (TYLENOL) 500 MG tablet Take 500 mg by mouth every 6 (six) hours as needed for pain.  Marland Kitchen albuterol (PROVENTIL HFA;VENTOLIN HFA) 108 (90 BASE) MCG/ACT inhaler Inhale 2 puffs into the lungs every 6 (six) hours as needed for wheezing or shortness of breath.  Marland Kitchen aspirin 81 MG tablet Take 81 mg by mouth daily.  Marland Kitchen buPROPion (WELLBUTRIN SR) 150 MG 12 hr tablet Take 150 mg by mouth 2 (two) times daily.  . enalapril (VASOTEC) 20 MG tablet Take 1 tablet (20 mg total) by mouth daily.  . fluticasone (FLONASE) 50 MCG/ACT nasal spray Place 2 sprays into both nostrils daily.  . hydrochlorothiazide (HYDRODIURIL) 25 MG tablet Take 1 tablet (25 mg total) by mouth daily.  Marland Kitchen loratadine (CLARITIN) 10 MG tablet Take 10 mg by mouth daily.  Marland Kitchen lovastatin (MEVACOR) 40 MG tablet Take 1 tablet (40 mg total) by  mouth at bedtime.  . naproxen (NAPROSYN) 500 MG tablet Take one po bid x 1 week then prn pain, take with food  . omeprazole (PRILOSEC) 40 MG capsule Take 40 mg by mouth daily.  . ranitidine (ZANTAC) 75 MG tablet Take 75 mg by mouth at bedtime.  Marland Kitchen rOPINIRole (REQUIP) 1 MG tablet TAKE 1 TABLET (1 MG TOTAL) BY MOUTH AT BEDTIME.  Marland Kitchen sertraline (ZOLOFT) 25 MG tablet TAKE 1 TABLET (25 MG TOTAL) BY MOUTH DAILY.   No current facility-administered medications on file prior to visit.    Review of Systems Per HPI unless specifically indicated above    Objective:    BP 142/82  Pulse 88  Temp(Src) 97.7 F (36.5 C) (Oral)  Wt 234 lb 8 oz (106.369 kg)  Physical Exam  Nursing note and vitals reviewed. Constitutional: She appears well-developed and well-nourished. No distress.  HENT:  Mouth/Throat:  Oropharynx is clear and moist. No oropharyngeal exudate.  Musculoskeletal: Normal range of motion. She exhibits no edema.  R shoulder WNL L Shoulder exam: No deformity of shoulders on inspection. ++ pain with palpation of subdeltoid bursa FROM in abduction and forward flexion although limited by pain. + pain/weakness with testing SITS in ext/int rotation. + pain with empty can sign.  Skin: Skin is warm and dry. No rash noted.  Psychiatric: She has a normal mood and affect.   Results for orders placed in visit on 10/03/13  D-DIMER, QUANTITATIVE      Result Value Ref Range   D-Dimer, Quant 0.29  0.00 - 0.48 ug/mL-FEU  LIPID PANEL      Result Value Ref Range   Cholesterol 168  0 - 200 mg/dL   Triglycerides 161.0  0.0 - 149.0 mg/dL   HDL 96.04 (*) >54.09 mg/dL   VLDL 81.1  0.0 - 91.4 mg/dL   LDL Cholesterol 782 (*) 0 - 99 mg/dL   Total CHOL/HDL Ratio 5     NonHDL 130.80    CBC WITH DIFFERENTIAL      Result Value Ref Range   WBC 7.2  4.0 - 10.5 K/uL   RBC 4.69  3.87 - 5.11 Mil/uL   Hemoglobin 13.4  12.0 - 15.0 g/dL   HCT 95.6  21.3 - 08.6 %   MCV 85.4  78.0 - 100.0 fl   MCHC 33.6  30.0 - 36.0 g/dL   RDW 57.8  46.9 - 62.9 %   Platelets 175.0  150.0 - 400.0 K/uL   Neutrophils Relative % 67.0  43.0 - 77.0 %   Lymphocytes Relative 22.9  12.0 - 46.0 %   Monocytes Relative 8.4  3.0 - 12.0 %   Eosinophils Relative 1.3  0.0 - 5.0 %   Basophils Relative 0.4  0.0 - 3.0 %   Neutro Abs 4.8  1.4 - 7.7 K/uL   Lymphs Abs 1.6  0.7 - 4.0 K/uL   Monocytes Absolute 0.6  0.1 - 1.0 K/uL   Eosinophils Absolute 0.1  0.0 - 0.7 K/uL   Basophils Absolute 0.0  0.0 - 0.1 K/uL   L shoulder steroid injection:  IC obtained and in chart.  Landmarks palpated and area cleaned with EtOH wipe.  Using posterior lateral approach, steroid injection performed today - 1cc depo medrol, 4cc lidocaine using 22g 1.5in needle.  Pt tolerated well.    Assessment & Plan:   Problem List Items Addressed This Visit    Pedal edema      No evidence of CHF today.  Weight stable.  Discussed elevation of legs,drinking water and avoiding salt/sodium. Update if worsening swelling. Wt Readings from Last 3 Encounters:  10/20/13 234 lb 8 oz (106.369 kg)  10/02/13 233 lb 8 oz (105.915 kg)  08/14/13 222 lb 4 oz (100.812 kg)      Arm pain, left - Primary     Actually today exam consistent with L subdeltoid bursitis - discussed this. Given not improving with prescription naprosyn, will provider with steroid injection today. Discussed red flags to seek urgent care ie red or warm joint, discussed anticipated course of improvement after steroid shot. Pt agrees with plan, states has tolerated steroid injections well in the past.        Follow up plan: Return in about 3 months (around 01/19/2014), or as needed, for follow up.

## 2013-10-20 NOTE — Patient Instructions (Signed)
For legs - elevate legs throughout the day, drink plenty of water and avoid salt/sodium. Let me know if leg swelling not improving. For left shoulder - I think you have bursitis. Steroid shot done today, then rest shoulder for 1-2 days, then start exercises provided today. Let us know if not improving as expected. Flu shot today.

## 2013-10-20 NOTE — Addendum Note (Signed)
Addended by: Josph Macho A on: 10/20/2013 03:29 PM   Modules accepted: Orders

## 2013-10-20 NOTE — Assessment & Plan Note (Signed)
Actually today exam consistent with L subdeltoid bursitis - discussed this. Given not improving with prescription naprosyn, will provider with steroid injection today. Discussed red flags to seek urgent care ie red or warm joint, discussed anticipated course of improvement after steroid shot. Pt agrees with plan, states has tolerated steroid injections well in the past.

## 2013-10-20 NOTE — Progress Notes (Signed)
Pre visit review using our clinic review tool, if applicable. No additional management support is needed unless otherwise documented below in the visit note. 

## 2013-10-20 NOTE — Assessment & Plan Note (Signed)
No evidence of CHF today.  Weight stable. Discussed elevation of legs,drinking water and avoiding salt/sodium. Update if worsening swelling. Wt Readings from Last 3 Encounters:  10/20/13 234 lb 8 oz (106.369 kg)  10/02/13 233 lb 8 oz (105.915 kg)  08/14/13 222 lb 4 oz (100.812 kg)

## 2013-11-04 ENCOUNTER — Ambulatory Visit (INDEPENDENT_AMBULATORY_CARE_PROVIDER_SITE_OTHER): Payer: Medicare PPO | Admitting: Pulmonary Disease

## 2013-11-04 ENCOUNTER — Encounter: Payer: Self-pay | Admitting: Pulmonary Disease

## 2013-11-04 VITALS — BP 118/82 | HR 80 | Temp 97.8°F | Ht 63.0 in | Wt 231.0 lb

## 2013-11-04 DIAGNOSIS — G4733 Obstructive sleep apnea (adult) (pediatric): Secondary | ICD-10-CM

## 2013-11-04 NOTE — Assessment & Plan Note (Signed)
She has history of severe sleep apnea.  She has tried maintaining compliance with CPAP, but has difficult tolerating mask and pressure from the machine.  She has progressive difficulty with daytime sleepiness.  I have reviewed the recent sleep study results with the patient.  We discussed how sleep apnea can affect various health problems including risks for hypertension, cardiovascular disease, and diabetes.  We also discussed how sleep disruption can increase risks for accident, such as while driving.  Weight loss as a means of improving sleep apnea was also reviewed.  Additional treatment options discussed were CPAP therapy, oral appliance, and surgical intervention.  To further assess will arrange for in lab titration study.  Will start on CPAP, and then transition to BiPAP +/- supplemental oxygen as needed.    Also advised her to d/w her DME about whether there are other mask options she could try.

## 2013-11-04 NOTE — Progress Notes (Deleted)
   Subjective:    Patient ID: Michelle Mcgrath, female    DOB: 03/01/1950, 63 y.o.   MRN: [id number]  HPI    Review of Systems  Constitutional: Positive for unexpected weight change. Negative for fever.  HENT: Positive for congestion and sneezing. Negative for dental problem, ear pain, nosebleeds, postnasal drip, rhinorrhea, sinus pressure, sore throat and trouble swallowing.   Eyes: Negative for redness and itching.  Respiratory: Positive for shortness of breath. Negative for cough, chest tightness and wheezing.   Cardiovascular: Negative for palpitations and leg swelling.  Gastrointestinal: Negative for nausea and vomiting.       Acid heartburn  Genitourinary: Negative for dysuria.  Musculoskeletal: Positive for arthralgias and joint swelling.  Skin: Negative for rash.  Neurological: Negative for headaches.  Hematological: Does not bruise/bleed easily.  Psychiatric/Behavioral: Positive for dysphoric mood. The patient is nervous/anxious.        Objective:   Physical Exam        Assessment & Plan:

## 2013-11-04 NOTE — Progress Notes (Signed)
Chief Complaint  Patient presents with  . SLEEP CONSULT    Referred by Dr Sharen HonesGutierrez. [facility]. Pt states that she cannot wear CPAP mask- claustrophobia and uncomfortable, does not like anything on her face. Epworth Score: 20    History of Present Illness: Michelle Mcgrath is a 63 y.o. female for evaluation of sleep problems.  She has history of sleep apnea.  [facility] and [facility] >> both showed severe sleep apnea.  She was set up on BiPAP initially and then CPAP most recently.  She has only tried full face mask >> didn't know there was any other option for mask.  She thinks her DME is [employer].  She has not received new supplies recently.  She has trouble with mask fit.  She tries to use CPAP, but can only use for about 1 hour at a time before she wakes up again.  She is having more trouble staying awake, and falls asleep whenever she is sitting quiet.  Her family will not let her drive because they are concerned she will fall asleep.  She can't sleep on her back.  She has a loud snore if she doesn't use CPAP, and she will talk in her sleep.  She takes requip at night for RLS and this helps.  She will sometimes get leg cramps.  She goes to sleep at 10 pm.  She falls asleep after 30 minutes.  She wakes up 5 times to use the bathroom.  She gets out of bed at 6 am .  She feels exhausted in the morning.  She denies morning headache.  She does not use anything to help her fall sleep or stay awake.  She denies sleep walking, bruxism, or nightmares.  She denies sleep hallucinations, sleep paralysis, or cataplexy.  The Epworth score is 20 out of 24.  Tests: PSG 02/24/08 >> AHI 51.4, SaO2 low 85%.  BiPAP 15/11 H2O with back up rate 10. PSG 03/28/11 >> AHI 64.6, SaO2 low 81%.  CPAP 17 cm H2O.  Michelle ParaDale E Shoff  has a past medical history of Fibromyalgia; Depression; GERD (gastroesophageal reflux disease); History of stomach ulcers; Allergic rhinitis; Hypertension; HLD  (hyperlipidemia); History of migraine; History of rheumatic fever; RLS (restless legs syndrome); OSA (obstructive sleep apnea); Osteoarthritis; PVD (posterior vitreous detachment); Chronic lower back pain; History of hydronephrosis; Obesity (03/07/2013); DDD (degenerative disc disease), lumbar; and Diverticulosis (2008).  Michelle ParaDale E Sladek  has past surgical history that includes Total abdominal hysterectomy (2012); Tonsillectomy and adenoidectomy (1992); Colonoscopy (03/2006); Esophagogastroduodenoscopy (03/2006); Cardiovascular stress test (2008 ); and sleep study (04/2011).  Prior to Admission medications   Medication Sig Start Date End Date Taking? Authorizing Provider  acetaminophen (TYLENOL) 500 MG tablet Take 500 mg by mouth every 6 (six) hours as needed for pain.   Yes Historical Provider, MD  albuterol (PROVENTIL HFA;VENTOLIN HFA) 108 (90 BASE) MCG/ACT inhaler Inhale 2 puffs into the lungs every 6 (six) hours as needed for wheezing or shortness of breath. 04/23/13  Yes Joaquim NamGraham S Duncan, MD  aspirin 81 MG tablet Take 81 mg by mouth daily.   Yes Historical Provider, MD  buPROPion (WELLBUTRIN SR) 150 MG 12 hr tablet Take 150 mg by mouth 2 (two) times daily.   Yes Historical Provider, MD  enalapril (VASOTEC) 20 MG tablet Take 1 tablet (20 mg total) by mouth daily. 10/02/13  Yes Eustaquio BoydenJavier Gutierrez, MD  fluticasone (FLONASE) 50 MCG/ACT nasal spray Place 2  sprays into both nostrils daily. 03/07/13  Yes Eustaquio Boyden, MD  hydrochlorothiazide (HYDRODIURIL) 25 MG tablet Take 1 tablet (25 mg total) by mouth daily. 09/12/13  Yes Eustaquio Boyden, MD  loratadine (CLARITIN) 10 MG tablet Take 10 mg by mouth daily.   Yes Historical Provider, MD  lovastatin (MEVACOR) 40 MG tablet Take 1 tablet (40 mg total) by mouth at bedtime. 09/12/13  Yes Eustaquio Boyden, MD  naproxen (NAPROSYN) 500 MG tablet Take one po bid x 1 week then prn pain, take with food 08/14/13  Yes Eustaquio Boyden, MD  omeprazole (PRILOSEC) 40 MG capsule Take  40 mg by mouth daily.   Yes Historical Provider, MD  ranitidine (ZANTAC) 75 MG tablet Take 75 mg by mouth at bedtime.   Yes Historical Provider, MD  rOPINIRole (REQUIP) 1 MG tablet TAKE 1 TABLET (1 MG TOTAL) BY MOUTH AT BEDTIME. 08/23/13  Yes Eustaquio Boyden, MD  sertraline (ZOLOFT) 25 MG tablet TAKE 1 TABLET (25 MG TOTAL) BY MOUTH DAILY. 06/30/13  Yes Eustaquio Boyden, MD    Allergies  Allergen Reactions  . Gabapentin Other (See Comments)    oversedation  . Lyrica [Pregabalin] Other (See Comments)    Swelling and general fatigue    Her family history includes Arthritis in her son; CAD (age of onset: 25) in her brother; Cancer in her son; Hypertension in her son. There is no history of Stroke or Diabetes.  She  reports that she has never smoked. She has never used smokeless tobacco. She reports that she does not drink alcohol or use illicit drugs.  Review of Systems  Constitutional: Positive for unexpected weight change. Negative for fever.  HENT: Positive for congestion and sneezing. Negative for dental problem, ear pain, nosebleeds, postnasal drip, rhinorrhea, sinus pressure, sore throat and trouble swallowing.   Eyes: Negative for redness and itching.  Respiratory: Positive for shortness of breath. Negative for cough, chest tightness and wheezing.   Cardiovascular: Negative for palpitations and leg swelling.  Gastrointestinal: Negative for nausea and vomiting.       Acid heartburn  Genitourinary: Negative for dysuria.  Musculoskeletal: Positive for arthralgias and joint swelling.  Skin: Negative for rash.  Neurological: Negative for headaches.  Hematological: Does not bruise/bleed easily.  Psychiatric/Behavioral: Positive for dysphoric mood. The patient is nervous/anxious.    Physical Exam:  General - No distress ENT - No sinus tenderness, no oral exudate, no LAN, no thyromegaly, TM clear, pupils equal/reactive, MP 4, enlarged tongue Cardiac - s1s2 regular, no murmur, pulses  symmetric Chest - No wheeze/rales/dullness, good air entry, normal respiratory excursion Back - No focal tenderness Abd - Soft, non-tender, no organomegaly, + bowel sounds Ext - No edema Neuro - Normal strength, cranial nerves intact Skin - No rashes Psych - Normal mood, and behavior  Assessment/plan:  Coralyn Helling, M.D. Pager [phone]

## 2013-11-04 NOTE — Patient Instructions (Signed)
Will arrange for CPAP titration study in sleep lab Follow up in 4 months

## 2013-11-06 ENCOUNTER — Institutional Professional Consult (permissible substitution): Payer: Medicare PPO | Admitting: Pulmonary Disease

## 2013-11-21 ENCOUNTER — Ambulatory Visit (INDEPENDENT_AMBULATORY_CARE_PROVIDER_SITE_OTHER): Payer: Medicare PPO | Admitting: Family Medicine

## 2013-11-21 ENCOUNTER — Encounter: Payer: Self-pay | Admitting: Family Medicine

## 2013-11-21 VITALS — BP 142/80 | HR 73 | Temp 98.2°F | Ht 63.0 in | Wt 242.0 lb

## 2013-11-21 DIAGNOSIS — R059 Cough, unspecified: Secondary | ICD-10-CM

## 2013-11-21 DIAGNOSIS — J069 Acute upper respiratory infection, unspecified: Secondary | ICD-10-CM

## 2013-11-21 DIAGNOSIS — B9789 Other viral agents as the cause of diseases classified elsewhere: Principal | ICD-10-CM

## 2013-11-21 DIAGNOSIS — R05 Cough: Secondary | ICD-10-CM

## 2013-11-21 MED ORDER — GUAIFENESIN-CODEINE 100-10 MG/5ML PO SYRP: 5.0000 mL | ORAL_SOLUTION | Freq: Every evening | ORAL | Status: DC | PRN

## 2013-11-21 MED ORDER — AZITHROMYCIN 250 MG PO TABS: ORAL_TABLET | ORAL | Status: DC

## 2013-11-21 NOTE — Patient Instructions (Signed)
Cough suppressant at night. Mucinex DM during the day. Can use albuterol as needed for wheeze. Can try trial of generic zyrtec (ceterizine) If not improving in 3-4 days fill rx for antibiotics. Call if not improving, got to ER if severe SOB.

## 2013-11-21 NOTE — Assessment & Plan Note (Signed)
Most likely viral URI but given weekend and pt with co-morbidities will provide with antibiotics to fill if not improving.

## 2013-11-21 NOTE — Progress Notes (Signed)
   Subjective:    Patient ID: Michelle Mcgrath, female    DOB: 12/12/1950, 63 y.o.   MRN: [id number]  Cough This is a new problem. The current episode started in the past 7 days (5 days ago). The problem has been gradually worsening. The cough is productive of sputum. Associated symptoms include nasal congestion, postnasal drip and rhinorrhea. Pertinent negatives include no chills, ear congestion, ear pain, fever, headaches, sore throat, shortness of breath, sweats or wheezing. Associated symptoms comments: Sinus pressure. The symptoms are aggravated by lying down (keeping up at night). Risk factors: nonsmoker. She has tried OTC cough suppressant (loratadine) for the symptoms. The treatment provided mild relief. Her past medical history is significant for environmental allergies. There is no history of asthma, bronchiectasis, COPD or emphysema.    Some sneeze and itchy eyes.    Review of Systems  Constitutional: Negative for fever and chills.  HENT: Positive for postnasal drip and rhinorrhea. Negative for ear pain and sore throat.   Respiratory: Positive for cough. Negative for shortness of breath and wheezing.   Allergic/Immunologic: Positive for environmental allergies.  Neurological: Negative for headaches.       Objective:   Physical Exam  Constitutional: Vital signs are normal. She appears well-developed and well-nourished. She is cooperative.  Non-toxic appearance. She does not appear ill. No distress.  HENT:  Head: Normocephalic.  Right Ear: Hearing, tympanic membrane, external ear and ear canal normal. Tympanic membrane is not erythematous, not retracted and not bulging.  Left Ear: Hearing, tympanic membrane, external ear and ear canal normal. Tympanic membrane is not erythematous, not retracted and not bulging.  Nose: Mucosal edema and rhinorrhea present. Right sinus exhibits no maxillary sinus tenderness and no frontal sinus tenderness. Left sinus exhibits no maxillary sinus  tenderness and no frontal sinus tenderness.  Mouth/Throat: Uvula is midline, oropharynx is clear and moist and mucous membranes are normal.  Eyes: Conjunctivae, EOM and lids are normal. Pupils are equal, round, and reactive to light. Lids are everted and swept, no foreign bodies found.  Neck: Trachea normal and normal range of motion. Neck supple. Carotid bruit is not present. No mass and no thyromegaly present.  Cardiovascular: Normal rate, regular rhythm, S1 normal, S2 normal, normal heart sounds, intact distal pulses and normal pulses.  Exam reveals no gallop and no friction rub.   No murmur heard. Pulmonary/Chest: Effort normal. Not tachypneic. No respiratory distress. She has decreased breath sounds. She has no wheezes. She has no rhonchi. She has no rales.  Upper airway wheeze  Neurological: She is alert.  Skin: Skin is warm, dry and intact. No rash noted.  Psychiatric: Her speech is normal and behavior is normal. Judgment normal. Her mood appears not anxious. Cognition and memory are normal. She does not exhibit a depressed mood.          Assessment & Plan:

## 2013-11-21 NOTE — Progress Notes (Signed)
Pre visit review using our clinic review tool, if applicable. No additional management support is needed unless otherwise documented below in the visit note. 

## 2013-12-01 ENCOUNTER — Encounter: Payer: Self-pay | Admitting: Family Medicine

## 2013-12-01 ENCOUNTER — Ambulatory Visit (INDEPENDENT_AMBULATORY_CARE_PROVIDER_SITE_OTHER)
Admission: RE | Admit: 2013-12-01 | Discharge: 2013-12-01 | Disposition: A | Discharge status: Home / Self Care | Payer: Medicare PPO | Source: Ambulatory Visit | Attending: Family Medicine | Admitting: Family Medicine

## 2013-12-01 ENCOUNTER — Ambulatory Visit (INDEPENDENT_AMBULATORY_CARE_PROVIDER_SITE_OTHER): Payer: Medicare PPO | Admitting: Family Medicine

## 2013-12-01 VITALS — BP 126/84 | HR 96 | Temp 97.8°F | Ht 62.5 in | Wt 235.5 lb

## 2013-12-01 DIAGNOSIS — E785 Hyperlipidemia, unspecified: Secondary | ICD-10-CM

## 2013-12-01 DIAGNOSIS — R05 Cough: Secondary | ICD-10-CM

## 2013-12-01 DIAGNOSIS — M79602 Pain in left arm: Secondary | ICD-10-CM

## 2013-12-01 DIAGNOSIS — Z23 Encounter for immunization: Secondary | ICD-10-CM

## 2013-12-01 DIAGNOSIS — I10 Essential (primary) hypertension: Secondary | ICD-10-CM

## 2013-12-01 DIAGNOSIS — R059 Cough, unspecified: Secondary | ICD-10-CM

## 2013-12-01 DIAGNOSIS — Z0000 Encounter for general adult medical examination without abnormal findings: Secondary | ICD-10-CM

## 2013-12-01 DIAGNOSIS — F329 Major depressive disorder, single episode, unspecified: Secondary | ICD-10-CM

## 2013-12-01 DIAGNOSIS — Z7189 Other specified counseling: Secondary | ICD-10-CM | POA: Insufficient documentation

## 2013-12-01 DIAGNOSIS — F32A Depression, unspecified: Secondary | ICD-10-CM

## 2013-12-01 DIAGNOSIS — Z1239 Encounter for other screening for malignant neoplasm of breast: Secondary | ICD-10-CM

## 2013-12-01 NOTE — Addendum Note (Signed)
Addended by: Eustaquio BoydenGUTIERREZ, Navika Hoopes on: 12/01/2013 03:57 PM   Modules accepted: Orders, Level of Service

## 2013-12-01 NOTE — Assessment & Plan Note (Signed)
Advanced directive: would want husband and oldest son to be HCPOA.

## 2013-12-01 NOTE — Addendum Note (Signed)
Addended by: Josph MachoANCE, Matty Deamer A on: 12/01/2013 04:07 PM   Modules accepted: Orders

## 2013-12-01 NOTE — Assessment & Plan Note (Signed)
Stable on sertraline and wellbutrin.

## 2013-12-01 NOTE — Assessment & Plan Note (Signed)
Lungs clear today - continue to monitor.

## 2013-12-01 NOTE — Assessment & Plan Note (Signed)
Point tender along left posterior humerus, as well as persistent tenderness at bursa. Did not respond to steroid injection last month. Check xray of L humerus today. If unrevealing, consider referral to ortho for further evaluation of shoulder pain. Not cardiac in nature.

## 2013-12-01 NOTE — Assessment & Plan Note (Signed)
Chronic, stable. Continue regimen. 

## 2013-12-01 NOTE — Assessment & Plan Note (Addendum)
I have personally reviewed the Medicare Annual Wellness questionnaire and have noted 1. The patient's medical and social history 2. Their use of alcohol, tobacco or illicit drugs 3. Their current medications and supplements 4. The patient's functional ability including ADL's, fall risks, home safety risks and hearing or visual impairment. 5. Diet and physical activity 6. Evidence for depression or mood disorders The patients weight, height, BMI have been recorded in the chart.  Hearing and vision has been addressed. I have made referrals, counseling and provided education to the patient based review of the above and I have provided the pt with a written personalized care plan for preventive services. Provider list updated - see scanned questionairre.  Reviewed preventative protocols and updated unless pt declined. EKG - NSR rate 80s, mild LAD, normal intervals, no acute ST/T changes, ?lead placement V2

## 2013-12-01 NOTE — Assessment & Plan Note (Signed)
Continue lovastatin 

## 2013-12-01 NOTE — Patient Instructions (Addendum)
prevnar today. We will call to schedule a mammogram Call your insurance about the shingles shot to see if it is covered or how much it would cost and where is cheaper (here or pharmacy).  If you want to receive here, call for nurse visit.  Good to see you today, call us with questions. Xray of left arm today. If unrevealing we may refer you to orthopedist.

## 2013-12-01 NOTE — Progress Notes (Signed)
BP 126/84  Pulse 96  Temp(Src) 97.8 F (36.6 C) (Oral)  Ht 5' 2.5" (1.588 m)  Wt 235 lb 8 oz (106.822 kg)  BMI 42.36 kg/m2   CC: welcome to medicare visit.  Subjective:    Patient ID: Michelle Mcgrath, female    DOB: 11/20/1950, 63 y.o.   MRN: [id number]  HPI: Michelle ParaDale E Grau is a 63 y.o. female presenting on 12/01/2013 for Annual Exam    CARDIOVASCULAR STRESS TEST Date: [facility] sleep study Date: 04/2011 AHI 64.6, severe OSA, improved with CPAP 17cm H2O  Persistent trouble with left arm pain that starts at shoulder and travels to elbow. Present for last 2 months. Subacromial steroid injection did not help nor did topical nor oral medications. Washing with hot water helps.  Failed vision screen today Hearing screen checked today. Denies falls, depression anhedonia or sadness  Preventative: COLONOSCOPY Date: 03/2006 few diverticula [facility]) Normal mammo 2014 per patient [facility] Due for one this year Well woman [place] [facility] several years ago, always normal. States last had one ~2013. Flu shot 10/2013 prevnar today. Tetanus unsure Shingles shot - has not had done in past Advanced directive: would want husband and oldest son to be HCPOA.   Lives with fiance, 1 dog  Grown children (2 sons, youngest died with bladder cancer)  Occ: disability for FM, lower back pain, depression  Edu: 8th grade  Activity: no regular exercise  Diet:   Relevant past medical, surgical, family and social history reviewed and updated as indicated.  Allergies and medications reviewed and updated. Current Outpatient Prescriptions on File Prior to Visit  Medication Sig  . acetaminophen (TYLENOL) 500 MG tablet Take 500 mg by mouth every 6 (six) hours as needed for pain.  Marland Kitchen. albuterol (PROVENTIL HFA;VENTOLIN HFA) 108 (90 BASE) MCG/ACT inhaler Inhale 2 puffs into the lungs every 6 (six) hours as needed for wheezing or shortness of breath.  Marland Kitchen. aspirin 81 MG tablet Take 81 mg by  mouth daily.  Marland Kitchen. buPROPion (WELLBUTRIN SR) 150 MG 12 hr tablet Take 150 mg by mouth 2 (two) times daily.  . enalapril (VASOTEC) 20 MG tablet Take 1 tablet (20 mg total) by mouth daily.  . fluticasone (FLONASE) 50 MCG/ACT nasal spray Place 2 sprays into both nostrils daily.  Marland Kitchen. guaiFENesin-codeine (ROBITUSSIN AC) 100-10 MG/5ML syrup Take 5-10 mLs by mouth at bedtime as needed for cough.  . hydrochlorothiazide (HYDRODIURIL) 25 MG tablet Take 1 tablet (25 mg total) by mouth daily.  Marland Kitchen. loratadine (CLARITIN) 10 MG tablet Take 10 mg by mouth daily.  Marland Kitchen. lovastatin (MEVACOR) 40 MG tablet Take 1 tablet (40 mg total) by mouth at bedtime.  Marland Kitchen. omeprazole (PRILOSEC) 40 MG capsule Take 40 mg by mouth daily.  . ranitidine (ZANTAC) 75 MG tablet Take 75 mg by mouth at bedtime.  Marland Kitchen. rOPINIRole (REQUIP) 1 MG tablet TAKE 1 TABLET (1 MG TOTAL) BY MOUTH AT BEDTIME.  Marland Kitchen. sertraline (ZOLOFT) 25 MG tablet TAKE 1 TABLET (25 MG TOTAL) BY MOUTH DAILY.  . naproxen (NAPROSYN) 500 MG tablet Take one po bid x 1 week then prn pain, take with food   No current facility-administered medications on file prior to visit.    Review of Systems Per HPI unless specifically indicated above    Objective:    BP 126/84  Pulse 96  Temp(Src) 97.8 F (36.6 C) (Oral)  Ht 5' 2.5" (1.588 m)  Wt 235 lb 8 oz (782.956(106.822  kg)  BMI 42.36 kg/m2  Physical Exam  Nursing note and vitals reviewed. Constitutional: She is oriented to person, place, and time. She appears well-developed and well-nourished. No distress.  obese  HENT:  Head: Normocephalic and atraumatic.  Right Ear: Hearing, tympanic membrane, external ear and ear canal normal.  Left Ear: Hearing, tympanic membrane, external ear and ear canal normal.  Nose: Nose normal.  Mouth/Throat: Uvula is midline, oropharynx is clear and moist and mucous membranes are normal. No oropharyngeal exudate, posterior oropharyngeal edema or posterior oropharyngeal erythema.  Eyes: Conjunctivae and EOM are  normal. Pupils are equal, round, and reactive to light. No scleral icterus.  Neck: Normal range of motion. Neck supple. Carotid bruit is not present. No thyromegaly present.  Cardiovascular: Normal rate, regular rhythm, normal heart sounds and intact distal pulses.   No murmur heard. Pulses:      Radial pulses are 2+ on the right side, and 2+ on the left side.  Pulmonary/Chest: Effort normal and breath sounds normal. No respiratory distress. She has no wheezes. She has no rales.  Abdominal: Soft. Bowel sounds are normal. She exhibits no distension and no mass. There is no tenderness. There is no rebound and no guarding.  Musculoskeletal: Normal range of motion. She exhibits no edema.  Lymphadenopathy:    She has no cervical adenopathy.  Neurological: She is alert and oriented to person, place, and time.  CN grossly intact, station and gait intact Recall 3/3 Calculation 5/5 D-L-R-O-W  Skin: Skin is warm and dry. No rash noted.  Psychiatric: She has a normal mood and affect. Her behavior is normal. Judgment and thought content normal.   Results for orders placed in visit on 10/03/13  D-DIMER, QUANTITATIVE      Result Value Ref Range   D-Dimer, Quant 0.29  0.00 - 0.48 ug/mL-FEU  LIPID PANEL      Result Value Ref Range   Cholesterol 168  0 - 200 mg/dL   Triglycerides 161.0  0.0 - 149.0 mg/dL   HDL 96.04 (*) >54.09 mg/dL   VLDL 81.1  0.0 - 91.4 mg/dL   LDL Cholesterol 782 (*) 0 - 99 mg/dL   Total CHOL/HDL Ratio 5     NonHDL 130.80    CBC WITH DIFFERENTIAL      Result Value Ref Range   WBC 7.2  4.0 - 10.5 K/uL   RBC 4.69  3.87 - 5.11 Mil/uL   Hemoglobin 13.4  12.0 - 15.0 g/dL   HCT 95.6  21.3 - 08.6 %   MCV 85.4  78.0 - 100.0 fl   MCHC 33.6  30.0 - 36.0 g/dL   RDW 57.8  46.9 - 62.9 %   Platelets 175.0  150.0 - 400.0 K/uL   Neutrophils Relative % 67.0  43.0 - 77.0 %   Lymphocytes Relative 22.9  12.0 - 46.0 %   Monocytes Relative 8.4  3.0 - 12.0 %   Eosinophils Relative 1.3  0.0 -  5.0 %   Basophils Relative 0.4  0.0 - 3.0 %   Neutro Abs 4.8  1.4 - 7.7 K/uL   Lymphs Abs 1.6  0.7 - 4.0 K/uL   Monocytes Absolute 0.6  0.1 - 1.0 K/uL   Eosinophils Absolute 0.1  0.0 - 0.7 K/uL   Basophils Absolute 0.0  0.0 - 0.1 K/uL      Assessment & Plan:   Problem List Items Addressed This Visit   Welcome to Medicare preventive visit - Primary  I have personally reviewed the Medicare Annual Wellness questionnaire and have noted 1. The patient's medical and social history 2. Their use of alcohol, tobacco or illicit drugs 3. Their current medications and supplements 4. The patient's functional ability including ADL's, fall risks, home safety risks and hearing or visual impairment. 5. Diet and physical activity 6. Evidence for depression or mood disorders The patients weight, height, BMI have been recorded in the chart.  Hearing and vision has been addressed. I have made referrals, counseling and provided education to the patient based review of the above and I have provided the pt with a written personalized care plan for preventive services. Provider list updated - see scanned questionairre.  Reviewed preventative protocols and updated unless pt declined. EKG - NSR rate 80s, mild LAD, normal intervals, no acute ST/T changes, ?lead placement V2     Relevant Orders      EKG 12-Lead (Completed)   Hypertension     Chronic, stable. Continue regimen.    HLD (hyperlipidemia)     Continue lovastatin.    Depression     Stable on sertraline and wellbutrin.    Cough     Lungs clear today - continue to monitor.    Arm pain, left     Point tender along left posterior humerus, as well as persistent tenderness at bursa. Did not respond to steroid injection last month. Check xray of L humerus today. If unrevealing, consider referral to ortho for further evaluation of shoulder pain. Not cardiac in nature.    Advanced care planning/counseling discussion     Advanced directive:  would want husband and oldest son to be HCPOA.      Other Visit Diagnoses   Breast cancer screening        Relevant Orders       MM DIGITAL SCREENING BILATERAL        Follow up plan: Return in about 6 months (around 06/02/2014), or as needed, for follow up visit.

## 2013-12-01 NOTE — Progress Notes (Signed)
Pre visit review using our clinic review tool, if applicable. No additional management support is needed unless otherwise documented below in the visit note. 

## 2013-12-02 ENCOUNTER — Telehealth: Payer: Self-pay | Admitting: Family Medicine

## 2013-12-02 NOTE — Telephone Encounter (Signed)
emmi mailed  °

## 2013-12-05 ENCOUNTER — Encounter: Payer: Self-pay | Admitting: *Deleted

## 2013-12-05 IMAGING — CR DG CHEST 1V PORT
1 series · 1 of 1 positions shown · non-contrast
Comparison: none

REASON FOR EXAM: abd pain
COMMENTS:

PROCEDURE:     DXR - DXR PORTABLE CHEST SINGLE VIEW  - March 18, 2011  [DATE]
RESULT:     The lungs are adequately inflated. There is no focal infiltrate.
The cardiac silhouette is top normal in size. The pulmonary vascularity is
not engorged.

[portable]
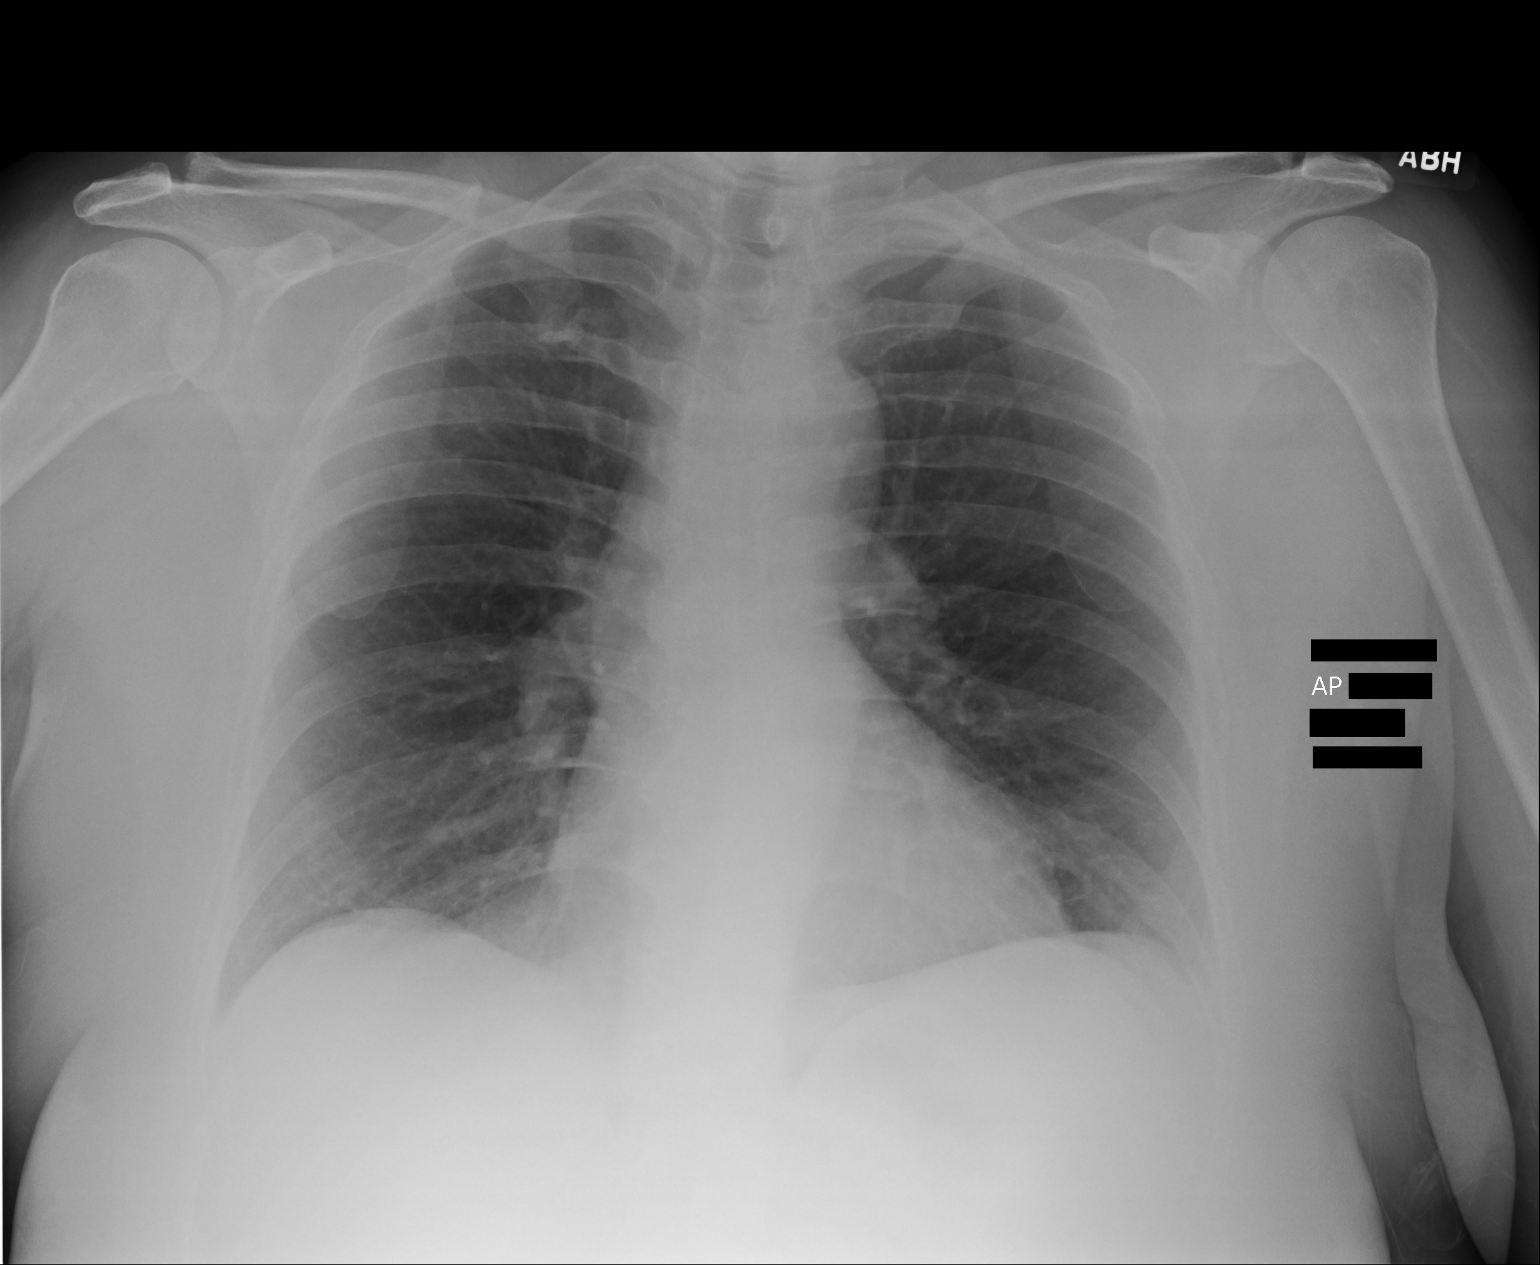

[1 of 1 positions shown; findings below may reference images not displayed]

IMPRESSION: I do not see evidence of acute cardiopulmonary abnormality.

## 2013-12-08 ENCOUNTER — Other Ambulatory Visit: Payer: Self-pay | Admitting: Family Medicine

## 2013-12-08 DIAGNOSIS — M79602 Pain in left arm: Secondary | ICD-10-CM

## 2013-12-10 ENCOUNTER — Other Ambulatory Visit: Payer: Self-pay | Admitting: Family Medicine

## 2013-12-10 NOTE — Telephone Encounter (Signed)
Ok to refill 

## 2013-12-19 ENCOUNTER — Other Ambulatory Visit: Payer: Self-pay | Admitting: Family Medicine

## 2013-12-26 ENCOUNTER — Other Ambulatory Visit: Payer: Self-pay | Admitting: Family Medicine

## 2014-01-07 ENCOUNTER — Other Ambulatory Visit: Payer: Self-pay | Admitting: Family Medicine

## 2014-01-09 ENCOUNTER — Ambulatory Visit (HOSPITAL_BASED_OUTPATIENT_CLINIC_OR_DEPARTMENT_OTHER): Payer: Medicare PPO | Attending: Pulmonary Disease

## 2014-01-12 ENCOUNTER — Encounter (HOSPITAL_BASED_OUTPATIENT_CLINIC_OR_DEPARTMENT_OTHER): Payer: Medicare PPO

## 2014-01-15 ENCOUNTER — Ambulatory Visit (INDEPENDENT_AMBULATORY_CARE_PROVIDER_SITE_OTHER): Payer: Medicare PPO | Admitting: Family Medicine

## 2014-01-15 ENCOUNTER — Encounter: Payer: Self-pay | Admitting: Family Medicine

## 2014-01-15 VITALS — BP 120/78 | HR 88 | Temp 98.0°F | Wt 241.5 lb

## 2014-01-15 DIAGNOSIS — J069 Acute upper respiratory infection, unspecified: Secondary | ICD-10-CM

## 2014-01-15 DIAGNOSIS — B9789 Other viral agents as the cause of diseases classified elsewhere: Principal | ICD-10-CM

## 2014-01-15 MED ORDER — HYDROCODONE-HOMATROPINE 5-1.5 MG/5ML PO SYRP: 5.0000 mL | ORAL_SOLUTION | Freq: Two times a day (BID) | ORAL | Status: DC | PRN

## 2014-01-15 NOTE — Assessment & Plan Note (Signed)
Anticipate viral URI and bronchitis. Treat with hycodan cough syrup (pt did not tolerate codeine well in past). Push fluids, rest, take ibuprofen for inflammation. Update if not improving as expected. Pt agrees with plan.

## 2014-01-15 NOTE — Patient Instructions (Addendum)
I think you have upper respiratory infection and possible bronchitis but this is likely viral and should improve on its own. Treat with hycodan cough syrup (with sedation precautions). Ibuprofen 400mg  twice daily with meals, along with plenty of water and rest should help you feel better Watch for fever >101, worsening productive cough, or sudden worsening after initial improvement - if any of this happens ,let me know. Look into echinacea and vitamin C.

## 2014-01-15 NOTE — Progress Notes (Signed)
Pre visit review using our clinic review tool, if applicable. No additional management support is needed unless otherwise documented below in the visit note. 

## 2014-01-15 NOTE — Progress Notes (Signed)
BP 120/78 mmHg  Pulse 88  Temp(Src) 98 F (36.7 C) (Oral)  Wt 241 lb 8 oz (109.544 kg)  SpO2 95%   CC: ST  Subjective:    Patient ID: Michelle Mcgrath, female    DOB: 05/07/1950, 63 y.o.   MRN: [id number]  HPI: Michelle Mcgrath is a 63 y.o. female presenting on 01/15/2014 for Sore Throat   5-6d h/o ST with dry cough. Foul tasting mucous. Headache. No significant congestion. No fevers/chills, PNDrainage, ear or tooth pain, abd pain or nausea.   So far has tried pseudophed, ibuprofen.  + grandchildren and son sick. No smokers at home.  No h/o asthma.  Relevant past medical, surgical, family and social history reviewed and updated as indicated. Interim medical history since our last visit reviewed. Allergies and medications reviewed and updated.  Current Outpatient Prescriptions on File Prior to Visit  Medication Sig  . acetaminophen (TYLENOL) 500 MG tablet Take 500 mg by mouth every 6 (six) hours as needed for pain.  Marland Kitchen. albuterol (PROVENTIL HFA;VENTOLIN HFA) 108 (90 BASE) MCG/ACT inhaler Inhale 2 puffs into the lungs every 6 (six) hours as needed for wheezing or shortness of breath.  Marland Kitchen. aspirin 81 MG tablet Take 81 mg by mouth daily.  Marland Kitchen. buPROPion (WELLBUTRIN SR) 150 MG 12 hr tablet Take 150 mg by mouth 2 (two) times daily.  . enalapril (VASOTEC) 20 MG tablet Take 1 tablet (20 mg total) by mouth daily.  . fluticasone (FLONASE) 50 MCG/ACT nasal spray Place 2 sprays into both nostrils daily.  . hydrochlorothiazide (HYDRODIURIL) 25 MG tablet TAKE 1 TABLET (25 MG TOTAL) BY MOUTH DAILY.  Marland Kitchen. loratadine (CLARITIN) 10 MG tablet Take 10 mg by mouth daily.  Marland Kitchen. lovastatin (MEVACOR) 40 MG tablet TAKE 1 TABLET (40 MG TOTAL) BY MOUTH AT BEDTIME.  . naproxen (NAPROSYN) 500 MG tablet Take one po bid x 1 week then prn pain, take with food  . omeprazole (PRILOSEC) 40 MG capsule Take 40 mg by mouth daily.  . ranitidine (ZANTAC) 75 MG tablet Take 75 mg by mouth at bedtime.  Marland Kitchen. rOPINIRole (REQUIP) 1 MG tablet  TAKE 1 TABLET (1 MG TOTAL) BY MOUTH AT BEDTIME.  Marland Kitchen. sertraline (ZOLOFT) 25 MG tablet TAKE 1 TABLET (25 MG TOTAL) BY MOUTH DAILY.   No current facility-administered medications on file prior to visit.   Past Medical History  Diagnosis Date  . Fibromyalgia     no mention of this from prior pcp  . Depression   . GERD (gastroesophageal reflux disease)   . History of stomach ulcers   . Allergic rhinitis     environmental  . Hypertension   . HLD (hyperlipidemia)   . History of migraine     none since better BP control  . History of rheumatic fever   . RLS (restless legs syndrome)     on gabapentin  . OSA (obstructive sleep apnea)     sleep study 2013 w/ severe sleep apnea , did not tolerate CPAP   . Osteoarthritis     s/p knee injections  . PVD (posterior vitreous detachment)   . Chronic lower back pain     MRI with annular tear L4/5 with disc protrusion s/p 1 ESI did not tolerate ([facility])  . History of hydronephrosis     left with ARF, ?due to pelvic organ prolapse,resolved  . Obesity 03/07/2013  . DDD (degenerative disc disease), lumbar   . Diverticulosis 2008    mild by CT  2008     Review of Systems Per HPI unless specifically indicated above     Objective:    BP 120/78 mmHg  Pulse 88  Temp(Src) 98 F (36.7 C) (Oral)  Wt 241 lb 8 oz (109.544 kg)  SpO2 95%  Wt Readings from Last 3 Encounters:  01/15/14 241 lb 8 oz (109.544 kg)  12/01/13 235 lb 8 oz (106.822 kg)  11/21/13 242 lb (109.77 kg)    Physical Exam  Constitutional: She appears well-developed and well-nourished. No distress.  HENT:  Head: Normocephalic and atraumatic.  Right Ear: Hearing, tympanic membrane, external ear and ear canal normal.  Left Ear: Hearing, tympanic membrane, external ear and ear canal normal.  Nose: No mucosal edema or rhinorrhea. Right sinus exhibits no maxillary sinus tenderness and no frontal sinus tenderness. Left sinus exhibits no maxillary sinus tenderness and no frontal  sinus tenderness.  Mouth/Throat: Uvula is midline and mucous membranes are normal. Posterior oropharyngeal erythema present. No oropharyngeal exudate, posterior oropharyngeal edema or tonsillar abscesses.  Eyes: Conjunctivae and EOM are normal. Pupils are equal, round, and reactive to light. No scleral icterus.  Neck: Normal range of motion. Neck supple. No thyromegaly present.  Cardiovascular: Normal rate, regular rhythm, normal heart sounds and intact distal pulses.   No murmur heard. Pulmonary/Chest: Effort normal and breath sounds normal. No respiratory distress. She has no wheezes. She has no rales.  Hoarse cough present but lungs clear  Lymphadenopathy:    She has no cervical adenopathy.  Skin: Skin is warm and dry. No rash noted.  Nursing note and vitals reviewed.      Assessment & Plan:   Problem List Items Addressed This Visit    Viral URI with cough - Primary    Anticipate viral URI and bronchitis. Treat with hycodan cough syrup (pt did not tolerate codeine well in past). Push fluids, rest, take ibuprofen for inflammation. Update if not improving as expected. Pt agrees with plan.        Follow up plan: Return if symptoms worsen or fail to improve.

## 2014-01-16 ENCOUNTER — Other Ambulatory Visit: Payer: Self-pay | Admitting: Family Medicine

## 2014-01-16 NOTE — Addendum Note (Signed)
Addended by: Eustaquio BoydenGUTIERREZ, Vibha Ferdig on: 01/16/2014 06:17 PM   Modules accepted: Kipp BroodSmartSet

## 2014-01-26 ENCOUNTER — Ambulatory Visit (HOSPITAL_BASED_OUTPATIENT_CLINIC_OR_DEPARTMENT_OTHER): Payer: Medicare PPO | Attending: Pulmonary Disease | Admitting: Radiology

## 2014-01-26 VITALS — Ht 63.0 in | Wt 225.0 lb

## 2014-01-26 DIAGNOSIS — G4733 Obstructive sleep apnea (adult) (pediatric): Secondary | ICD-10-CM | POA: Insufficient documentation

## 2014-02-03 ENCOUNTER — Ambulatory Visit (INDEPENDENT_AMBULATORY_CARE_PROVIDER_SITE_OTHER): Payer: Medicare PPO | Admitting: Family Medicine

## 2014-02-03 ENCOUNTER — Encounter: Payer: Self-pay | Admitting: Family Medicine

## 2014-02-03 VITALS — BP 132/68 | HR 84 | Temp 97.7°F | Wt 238.5 lb

## 2014-02-03 DIAGNOSIS — R600 Localized edema: Secondary | ICD-10-CM

## 2014-02-03 NOTE — Patient Instructions (Signed)
Pulse ox today. Let's try stopping ibuprofen/advil. Tylenol is ok. Decrease requip ropinirole to 0.5mg  nightly (1/2 dose). See if this will help leg swelling/pain at all. Let me know how both of these work Good to see you today, call us with quesitons.

## 2014-02-03 NOTE — Assessment & Plan Note (Signed)
Anticipate med related - recently started NSAID for viral URI and has been on requip since summer. rec stop NSAID, rec decrease requip to 0.5mg  nightly. Update with effect of this. Not consistent with DVT today.

## 2014-02-03 NOTE — Progress Notes (Signed)
Pre visit review using our clinic review tool, if applicable. No additional management support is needed unless otherwise documented below in the visit note. 

## 2014-02-03 NOTE — Progress Notes (Signed)
BP 132/68 mmHg  Pulse 84  Temp(Src) 97.7 F (36.5 C) (Oral)  Wt 238 lb 8 oz (108.183 kg)  SpO2 96%   CC: leg swelling  Subjective:    Patient ID: Michelle Mcgrath, female    DOB: 06/26/1950, 63 y.o.   MRN: [id number]  HPI: Michelle Mcgrath is a 63 y.o. female presenting on 02/03/2014 for Edema   Endorses 1+ mo h/o bilateral leg and foot swelling. This is painful. Running hot water down legs helps. Has not changed diet or activity level. Avoids salt in diet. Soaks feet in alcohol. Denies chest pain or significant dyspnea. Denies unexpected weight gain.   Known h/o RLS on requip which was started over summer. No personal hx blood clots. + brother with blood clot in past. No prolonged car or plane ride, not on HRT.   Seen here 01/15/2014 with dx viral URI, treated with hycodan cough syrup and ibuprofen 400mg  bid. Has continued this.   CARDIOVASCULAR STRESS TEST Date: [facility] sleep study Date: 04/2011 AHI 64.6, severe OSA, improved with CPAP 17cm H2O  Relevant past medical, surgical, family and social history reviewed and updated as indicated. Interim medical history since our last visit reviewed. Allergies and medications reviewed and updated.  Current Outpatient Prescriptions on File Prior to Visit  Medication Sig  . acetaminophen (TYLENOL) 500 MG tablet Take 500 mg by mouth every 6 (six) hours as needed for pain.  Marland Kitchen. albuterol (PROVENTIL HFA;VENTOLIN HFA) 108 (90 BASE) MCG/ACT inhaler Inhale 2 puffs into the lungs every 6 (six) hours as needed for wheezing or shortness of breath.  Marland Kitchen. aspirin 81 MG tablet Take 81 mg by mouth daily.  Marland Kitchen. buPROPion (WELLBUTRIN SR) 150 MG 12 hr tablet Take 150 mg by mouth 2 (two) times daily.  . enalapril (VASOTEC) 20 MG tablet TAKE 1 TABLET (20 MG TOTAL) BY MOUTH DAILY.  . fluticasone (FLONASE) 50 MCG/ACT nasal spray Place 2 sprays into both nostrils daily.  . hydrochlorothiazide (HYDRODIURIL) 25 MG tablet TAKE 1 TABLET (25 MG TOTAL) BY MOUTH DAILY.    Marland Kitchen. loratadine (CLARITIN) 10 MG tablet Take 10 mg by mouth daily.  Marland Kitchen. lovastatin (MEVACOR) 40 MG tablet TAKE 1 TABLET (40 MG TOTAL) BY MOUTH AT BEDTIME.  . naproxen (NAPROSYN) 500 MG tablet Take one po bid x 1 week then prn pain, take with food  . omeprazole (PRILOSEC) 40 MG capsule Take 40 mg by mouth daily.  . ranitidine (ZANTAC) 75 MG tablet Take 75 mg by mouth at bedtime.  Marland Kitchen. rOPINIRole (REQUIP) 1 MG tablet TAKE 1 TABLET (1 MG TOTAL) BY MOUTH AT BEDTIME.  Marland Kitchen. sertraline (ZOLOFT) 25 MG tablet TAKE 1 TABLET (25 MG TOTAL) BY MOUTH DAILY.   No current facility-administered medications on file prior to visit.    Review of Systems Per HPI unless specifically indicated above     Objective:    BP 132/68 mmHg  Pulse 84  Temp(Src) 97.7 F (36.5 C) (Oral)  Wt 238 lb 8 oz (108.183 kg)  SpO2 96%  Wt Readings from Last 3 Encounters:  02/03/14 238 lb 8 oz (108.183 kg)  01/26/14 225 lb (102.059 kg)  01/15/14 241 lb 8 oz (109.544 kg)    Physical Exam  Constitutional: She appears well-developed and well-nourished. No distress.  HENT:  Mouth/Throat: Oropharynx is clear and moist. No oropharyngeal exudate.  Cardiovascular: Normal rate, regular rhythm, normal heart sounds and intact distal pulses.   No murmur heard. Pulmonary/Chest: Effort normal and  breath sounds normal. No respiratory distress. She has no wheezes. She has no rales.  Musculoskeletal: She exhibits edema (tr nonpitting edema).  R calf circ 48cm L calf circ 48cm 2+ DP bilaterally No palp cords + few varicose veins L>R Lower legs tender to palpation throughout  Skin: Skin is warm and dry. No rash noted.  Psychiatric: She has a normal mood and affect.  Nursing note and vitals reviewed.      Assessment & Plan:   Problem List Items Addressed This Visit    Pedal edema - Primary    Anticipate med related - recently started NSAID for viral URI and has been on requip since summer. rec stop NSAID, rec decrease requip to 0.5mg   nightly. Update with effect of this. Not consistent with DVT today.        Follow up plan: Return if symptoms worsen or fail to improve.

## 2014-02-09 ENCOUNTER — Other Ambulatory Visit: Payer: Self-pay

## 2014-02-09 MED ORDER — HYDROCODONE-HOMATROPINE 5-1.5 MG/5ML PO SYRP: 5.0000 mL | ORAL_SOLUTION | Freq: Two times a day (BID) | ORAL | Status: DC | PRN

## 2014-02-09 NOTE — Telephone Encounter (Signed)
printed and in Kim's box. 

## 2014-02-09 NOTE — Telephone Encounter (Signed)
Patient notified and Rx placed up front for pick up. 

## 2014-02-09 NOTE — Telephone Encounter (Signed)
Pt left v/m requesting rx hycodan; call when ready for pick up. pt last seen 02/03/14 and pt seen 01/15/14 with cough.Please advise.

## 2014-02-16 ENCOUNTER — Telehealth: Payer: Self-pay | Admitting: Pulmonary Disease

## 2014-02-16 DIAGNOSIS — G4733 Obstructive sleep apnea (adult) (pediatric): Secondary | ICD-10-CM

## 2014-02-16 NOTE — Sleep Study (Signed)
[facility]  NAME: Michelle Mcgrath DATE OF BIRTH:  14-Apr-1950 MEDICAL RECORD NUMBER [id number]  LOCATION:  [facility]  PHYSICIAN: Coralyn Helling, M.D. DATE OF STUDY: 01/26/2014  SLEEP STUDY TYPE: CPAP titration               REFERRING PHYSICIAN: Coralyn Helling, MD  INDICATION FOR STUDY:  Michelle Mcgrath is a 64 y.o. female who has a history of sleep apnea.  Most recent sleep study from 03/28/11 showed AHI of 64.6, and SaO2 low of 81%.  He had difficulty tolerating CPAP.  She returns to the sleep lab for a CPAP titration study.  EPWORTH SLEEPINESS SCORE: 14. HEIGHT:  (160 cm)  WEIGHT: 225 lb (102.059 kg)    Body mass index is 39.87 kg/(m^2).  NECK SIZE: 17 in.  MEDICATIONS:  Current Outpatient Prescriptions on File Prior to Visit  Medication Sig Dispense Refill  . acetaminophen (TYLENOL) 500 MG tablet Take 500 mg by mouth every 6 (six) hours as needed for pain.    Marland Kitchen albuterol (PROVENTIL HFA;VENTOLIN HFA) 108 (90 BASE) MCG/ACT inhaler Inhale 2 puffs into the lungs every 6 (six) hours as needed for wheezing or shortness of breath. 1 Inhaler 1  . aspirin 81 MG tablet Take 81 mg by mouth daily.    Marland Kitchen buPROPion (WELLBUTRIN SR) 150 MG 12 hr tablet Take 150 mg by mouth 2 (two) times daily.    . enalapril (VASOTEC) 20 MG tablet TAKE 1 TABLET (20 MG TOTAL) BY MOUTH DAILY. 30 tablet 6  . fluticasone (FLONASE) 50 MCG/ACT nasal spray Place 2 sprays into both nostrils daily. 16 g 3  . hydrochlorothiazide (HYDRODIURIL) 25 MG tablet TAKE 1 TABLET (25 MG TOTAL) BY MOUTH DAILY. 90 tablet 1  . loratadine (CLARITIN) 10 MG tablet Take 10 mg by mouth daily.    Marland Kitchen lovastatin (MEVACOR) 40 MG tablet TAKE 1 TABLET (40 MG TOTAL) BY MOUTH AT BEDTIME. 90 tablet 1  . naproxen (NAPROSYN) 500 MG tablet Take one po bid x 1 week then prn pain, take with food 60 tablet 0  . omeprazole (PRILOSEC) 40 MG capsule Take 40 mg by mouth daily.    . ranitidine (ZANTAC) 75 MG tablet Take 75 mg  by mouth at bedtime.    Marland Kitchen rOPINIRole (REQUIP) 1 MG tablet TAKE 1 TABLET (1 MG TOTAL) BY MOUTH AT BEDTIME. (Patient taking differently: TAKE 1/2 TABLET (0.5MG  TOTAL) BY MOUTH AT BEDTIME.) 30 tablet 3  . sertraline (ZOLOFT) 25 MG tablet TAKE 1 TABLET (25 MG TOTAL) BY MOUTH DAILY. 30 tablet 6   No current facility-administered medications on file prior to visit.    SLEEP ARCHITECTURE:  Total recording time: 447.5 minutes.  Total sleep time was: 372 minutes.  Sleep efficiency: 83.1%.  Sleep latency: 14.5 minutes.  REM latency: 148 minutes.  Stage N1: 7.1%.  Stage N2: 43.3%.  Stage N3: 0%.  Stage R:  49.6%.  Supine sleep: 185.5 minutes.  Non-supine sleep: 186.5 minutes.  CARDIAC DATA:  Average heart rate: 75 beats per minute. Rhythm strip: sinus rhythm with PVCs and PACs.  RESPIRATORY DATA: Average respiratory rate: 12.  She was started on CPAP 5 and increased to 18 cm H2O.  With CPAP at 17 cm H2O her AHI was reduced to 1.6.  At this pressure she was observed in REM and supine sleep.  MOVEMENT/PARASOMNIA:  Periodic limb movement: 0.  Period limb movements with arousals: 0. Restroom trips: 1.  OXYGEN DATA:  Baseline oxygenation: 93%. Lowest SaO2: 87%. Time spent below SaO2 90%: 1.5 minutes. Supplemental oxygen used: none.  IMPRESSION/ RECOMMENDATION:   This was a successful titration study.  She did well with CPAP 17 cm H2O.  She was fitted with a [employer]kel simplus medium sized full face mask.   If she has difficulty tolerating CPAP at this high pressure setting, then she might need to be tried on BiPAP instead.  Coralyn HellingVineet Burnetta Kohls, M.D. Diplomate, Biomedical engineerAmerican Board of Sleep Medicine  ELECTRONICALLY SIGNED ON:  02/16/2014, 4:57 PM [facility] PH: [phone]   FX: [phone] ACCREDITED BY THE AMERICAN ACADEMY OF SLEEP MEDICINE

## 2014-02-16 NOTE — Telephone Encounter (Signed)
CPAP 01/26/14 >> CPAP 17 cm H2O >> AHI 1.6, +R, +S  Will have my nurse inform pt that she did very well with CPAP.   Options are to arrange for CPAP set up now, and f/u in 2 months  Or   ROV now.   If she is agreeable to CPAP Set up now, then arrange for CPAP 17 cm H2O with heated humidity and mask of choice.  Schedule ROV 2 months after CPAP Set up.

## 2014-02-17 ENCOUNTER — Other Ambulatory Visit: Payer: Self-pay | Admitting: Pulmonary Disease

## 2014-02-17 DIAGNOSIS — G4733 Obstructive sleep apnea (adult) (pediatric): Secondary | ICD-10-CM

## 2014-02-17 NOTE — Telephone Encounter (Signed)
Patient notified.  CPAP ordered, patient will call to schedule OV once she has started using CPAP machine.  Nothing further needed at this time.

## 2014-02-18 ENCOUNTER — Encounter: Payer: Self-pay | Admitting: Family Medicine

## 2014-03-13 ENCOUNTER — Ambulatory Visit (INDEPENDENT_AMBULATORY_CARE_PROVIDER_SITE_OTHER)
Admission: RE | Admit: 2014-03-13 | Discharge: 2014-03-13 | Disposition: A | Discharge status: Home / Self Care | Payer: Medicare HMO | Source: Ambulatory Visit | Attending: Family Medicine | Admitting: Family Medicine

## 2014-03-13 ENCOUNTER — Ambulatory Visit (INDEPENDENT_AMBULATORY_CARE_PROVIDER_SITE_OTHER): Payer: Medicare HMO | Admitting: Family Medicine

## 2014-03-13 ENCOUNTER — Encounter: Payer: Self-pay | Admitting: Family Medicine

## 2014-03-13 ENCOUNTER — Telehealth: Payer: Self-pay | Admitting: Family Medicine

## 2014-03-13 VITALS — BP 141/83 | HR 83 | Temp 97.7°F | Ht 62.5 in | Wt 243.5 lb

## 2014-03-13 DIAGNOSIS — M25512 Pain in left shoulder: Secondary | ICD-10-CM

## 2014-03-13 DIAGNOSIS — R0789 Other chest pain: Secondary | ICD-10-CM | POA: Insufficient documentation

## 2014-03-13 DIAGNOSIS — R0602 Shortness of breath: Secondary | ICD-10-CM | POA: Insufficient documentation

## 2014-03-13 MED ORDER — HYDROCODONE-ACETAMINOPHEN 5-325 MG PO TABS: 1.0000 | ORAL_TABLET | Freq: Four times a day (QID) | ORAL | Status: DC | PRN

## 2014-03-13 NOTE — Telephone Encounter (Signed)
Pt has appt today with Dr Ermalene SearingBedsole at [facility]11AM.

## 2014-03-13 NOTE — Telephone Encounter (Signed)
Ms. Michelle Mcgrath given normal x-ray results via telephone.  Advised to call if trouble breathing with pain control.. Patient states she is doing pretty good now.

## 2014-03-13 NOTE — Telephone Encounter (Signed)
Pt stopped by to see is she could speak with you since she saw she missed a call.  Please call back at [phone]. Thanks

## 2014-03-13 NOTE — Progress Notes (Signed)
Subjective:    Patient ID: Michelle Mcgrath, female    DOB: 05/19/1950, 64 y.o.   MRN: [id number]  HPI  64 year old old female pt of Dr. Reece AgarG with histroy of HTN, fibromyalgia, depression  presents with  acute left shoulder and left chest wall pain pain in last 2 weeks.  She is tender on left chest wall, where bra touches, sore  Pain is off and on sharp pains in shoulder. Left shoulder is very tight and stiff.   Occ today has felt like she cannot breath, hurts to take deep breath.  Pain 10/10   She has been seeing ORTHO Dr. Hyacinth MeekerMiller left shoulder... Had steroid injection 12/2013, helped for a month. 3 days ago saw ORTHO again.. Got a different shot (not cortisone). It did not help with pain.  Now on muscle relaxant and tramadol for pain.. Not helping at this time.  She has had a history of coughing spells in last year. No current coughing.  She has some issues with swelling in past few months in legs.  Nonsmoker  Review of Systems  Constitutional: Negative for fever and fatigue.  HENT: Negative for ear pain.   Eyes: Negative for pain.  Respiratory: Positive for shortness of breath. Negative for chest tightness and wheezing.   Cardiovascular: Positive for chest pain. Negative for palpitations and leg swelling.  Gastrointestinal: Negative for abdominal pain.  Genitourinary: Negative for dysuria.       Objective:   Physical Exam  Constitutional: Vital signs are normal. She appears well-developed and well-nourished. She is cooperative.  Non-toxic appearance. She does not appear ill. No distress.  HENT:  Head: Normocephalic.  Right Ear: Hearing, tympanic membrane, external ear and ear canal normal. Tympanic membrane is not erythematous, not retracted and not bulging.  Left Ear: Hearing, tympanic membrane, external ear and ear canal normal. Tympanic membrane is not erythematous, not retracted and not bulging.  Nose: No mucosal edema or rhinorrhea. Right sinus exhibits no maxillary sinus  tenderness and no frontal sinus tenderness. Left sinus exhibits no maxillary sinus tenderness and no frontal sinus tenderness.  Mouth/Throat: Uvula is midline, oropharynx is clear and moist and mucous membranes are normal.  Eyes: Conjunctivae, EOM and lids are normal. Pupils are equal, round, and reactive to light. Lids are everted and swept, no foreign bodies found.  Neck: Trachea normal and normal range of motion. Neck supple. Carotid bruit is not present. No thyroid mass and no thyromegaly present.  Cardiovascular: Normal rate, regular rhythm, S1 normal, S2 normal, normal heart sounds, intact distal pulses and normal pulses.  Exam reveals no gallop and no friction rub.   No murmur heard. Pulmonary/Chest: Effort normal and breath sounds normal. No tachypnea. No respiratory distress. She has no decreased breath sounds. She has no wheezes. She has no rhonchi. She has no rales. She exhibits tenderness and bony tenderness.    ttp palpation diffusely and with deep breaths.  Abdominal: Soft. Normal appearance and bowel sounds are normal. There is no tenderness.  Musculoskeletal:       Left shoulder: She exhibits decreased range of motion, tenderness, bony tenderness and spasm. She exhibits no deformity.       Cervical back: She exhibits decreased range of motion and tenderness. She exhibits no bony tenderness.  Left shoulder minimaly mobile given pain  Neg spurling  Neurological: She is alert.  Skin: Skin is warm, dry and intact. No rash noted.  Psychiatric: Her speech is normal and behavior is normal. Judgment  and thought content normal. Her mood appears not anxious. Cognition and memory are normal. She does not exhibit a depressed mood.          Assessment & Plan:

## 2014-03-13 NOTE — Progress Notes (Signed)
Pre visit review using our clinic review tool, if applicable. No additional management support is needed unless otherwise documented below in the visit note. 

## 2014-03-13 NOTE — Telephone Encounter (Signed)
[facility] - Client TELEPHONE ADVICE RECORD [facility] Patient Name: Michelle CockayneDALE Umble DOB: 05/14/1950 Initial Comment Caller states the patient has left arm pain and left shoulder pain. Nurse Assessment Nurse: Yetta BarreJones, RN, Miranda Date/Time (Eastern Time): 03/13/2014 8:48:03 AM Confirm and document reason for call. If symptomatic, describe symptoms. ---Caller states she is having pain in her left upper arm for the last couple of weeks. Seen by orthopedic doctor 2 days ago and was given shot. Has the patient traveled out of the country within the last 30 days? ---Not Applicable Does the patient require triage? ---Yes Related visit to physician within the last 2 weeks? ---Yes Does the PT have any chronic conditions? (i.e. diabetes, asthma, etc.) ---Yes List chronic conditions. ---Bone spurs in her neck, Fibromyalgia, Guidelines Guideline Title Affirmed Question Affirmed Notes Shoulder Pain [1] MODERATE pain (e.g., interferes with normal activities) AND [2] present > 3 days Final Disposition User See PCP When Office is Open (within 3 days) Yetta BarreJones, RN, Miranda Comments Pt states she already has an appt at 11am.

## 2014-03-13 NOTE — Patient Instructions (Addendum)
We will call with CXR results. Start vicodin for pain in left shoulder and chest wall. Call Dr. Hyacinth MeekerMiller to let him know pain and stiffness is worse since recent shots for further recommendations. If shortness of breath is not improving with pain control... Call ASAP for further imaging.

## 2014-03-24 NOTE — Assessment & Plan Note (Signed)
Follow up with ORTHO as planned.

## 2014-03-24 NOTE — Assessment & Plan Note (Signed)
May be secondary to fibromyalgia vs referred from shoulder .  Treat with vicodin for pain control.

## 2014-03-24 NOTE — Assessment & Plan Note (Signed)
Eval with CXR. ? secondayy to chest wall pain.  No clear sign of PE, cardiac source etc.

## 2014-03-30 ENCOUNTER — Ambulatory Visit: Payer: Self-pay | Admitting: Specialist

## 2014-04-01 ENCOUNTER — Ambulatory Visit: Payer: Medicare PPO | Admitting: Pulmonary Disease

## 2014-04-10 ENCOUNTER — Ambulatory Visit: Payer: Self-pay | Admitting: Specialist

## 2014-05-08 HISTORY — PX: SHOULDER ARTHROSCOPY W/ ROTATOR CUFF REPAIR: SHX2400

## 2014-05-26 ENCOUNTER — Ambulatory Visit
Admit: 2014-05-26 | Disposition: A | Discharge status: Home / Self Care | Payer: Self-pay | Attending: Specialist | Admitting: Specialist

## 2014-05-26 DIAGNOSIS — I10 Essential (primary) hypertension: Secondary | ICD-10-CM

## 2014-05-26 LAB — CBC WITH DIFFERENTIAL/PLATELET
Basophil #: 0 10*3/uL (ref 0.0–0.1)
Basophil %: 0.4 %
Eosinophil #: 0.1 10*3/uL (ref 0.0–0.7)
Eosinophil %: 0.8 %
HCT: 41.6 % (ref 35.0–47.0)
HGB: 13.8 g/dL (ref 12.0–16.0)
Lymphocyte #: 1.9 10*3/uL (ref 1.0–3.6)
Lymphocyte %: 23 %
MCH: 27.4 pg (ref 26.0–34.0)
MCHC: 33.3 g/dL (ref 32.0–36.0)
MCV: 82 fL (ref 80–100)
Monocyte #: 0.6 x10 3/mm (ref 0.2–0.9)
Monocyte %: 7.4 %
Neutrophil #: 5.6 10*3/uL (ref 1.4–6.5)
Neutrophil %: 68.4 %
Platelet: 150 10*3/uL (ref 150–440)
RBC: 5.06 10*6/uL (ref 3.80–5.20)
RDW: 14.1 % (ref 11.5–14.5)
WBC: 8.1 10*3/uL (ref 3.6–11.0)

## 2014-05-26 LAB — POTASSIUM: Potassium: 3.4 mmol/L — ABNORMAL LOW

## 2014-05-27 ENCOUNTER — Other Ambulatory Visit: Payer: Self-pay | Admitting: Family Medicine

## 2014-05-31 NOTE — Consult Note (Signed)
PATIENT NAME:  Michelle Mcgrath, Michelle E MR#:  [id number] DATE OF BIRTH:  [birth year]  DATE OF CONSULTATION:  03/19/2011  REFERRING PHYSICIAN:   CONSULTING PHYSICIAN:  Adella HareJ. Wilton Tryniti Laatsch, MD  CHIEF COMPLAINT: Abdominal pain.   HISTORY OF PRESENT ILLNESS: This 64 year old female had sudden onset of abdominal pain yesterday which was a somewhat diffuse crampy pain, was accompanied by several episodes of nausea and vomiting. No hematemesis. She also had bloody diarrhea. Some of the blood was bright red and some was darker. She also had some chills and sweating. She was admitted emergently through the Emergency Room, was placed on IV fluids, Cipro, Flagyl, and analgesics. Has had CT scan. Has been taking some clear liquids since admission. Says she feels better now than she did on admission.   On further questioning, she had had a bout of bloody diarrhea in 2009. She reports history of divert[place]itis and [place]   Since then she has had occasional bouts of abdominal cramps and diaphoresis.   She reports having a colonoscopy in 2008 which was normal.   She reports that on October 31st she had a vaginal hysterectomy and since that time has had frequent constipation. She had a past history of constipation and had been taking Peri-Colace, however, due to expense she had stopped taking it and has had numerous bouts of constipation during the last few months although had not been passing any blood the last few months. Does report that leading up to yesterday she had been taking solid food.   PAST MEDICAL HISTORY:  1. Hypertension.  2. Hypercholesterolemia.  3. Sleep apnea. 4. Gastroesophageal reflux. 5. Depression.  6. Degenerative joint disease of the low back with chronic low back pain.   SOCIAL HISTORY: She is single. Does not smoke. Does not drink any alcohol. She [place] in [place].   FAMILY HISTORY: Positive for hypertension.   MEDICATIONS:  1. Percocet 1 tablet 4  times a day p.r.n. for pain. 2. Aspirin 81 mg. 3. Cymbalta 60 mg daily.  4. Flonase spray one spray each nostril b.i.d.  5. Wellbutrin SR 150 mg b.i.d.  6. Omeprazole 40 mg daily. 7. Lovastatin 40 mg at bedtime.  8. Hydrochlorothiazide 25 mg daily.  9. Enalapril 20 mg daily.   ALLERGIES: None known.   REVIEW OF SYSTEMS: She reports that in January she had an upper respiratory infection with cough and took a Z-Pak and also took a course of Augmentin and gradually improved and eventually resolved. She reports subsequently has been breathing satisfactorily. She reports that she had some mild blurring of vision needing reading glasses. She reports no difficulties hearing. She does report a history of heartburn but does not have heartburn when she is taking omeprazole. Has had some occasional difficulty swallowing in the past but is improved with taking omeprazole. She reports no chest pain. No current dyspnea. No swollen glands. She is voiding satisfactorily. Has some ankle edema usually in the summer months but not in the winter. She's had no recent sores or boils. She does have chronic low back pain, occasional discomfort in her legs as well. Does have the above-mentioned frequent constipation. Weight had been stable prior to present illness. Does have some fiber in her diet.   PHYSICAL EXAMINATION:   GENERAL: She is awake, alert, and oriented in no acute distress. She is in her hospital bed.   VITAL SIGNS: Temperature 98.1, pulse 90, respirations 17, blood pressure 119/73, pulse oximetry 95% on  room air. Her weight is 188 pounds. Height is 63 inches. BMI is 33.4.   SKIN: Warm and dry.    HEENT: Pupils equal and reactive to light. Extraocular movements are intact. Sclerae clear. Pharynx clear.   NECK: No palpable mass.   LUNGS: Lung sounds are clear. No respiratory distress.   HEART: Regular rhythm, S1 and S2 without murmur.   ABDOMEN: Obese and soft. There is mild tenderness which is  more on the left than on the right and in the periumbilical area and also in left lower quadrant. There is no palpable mass. I palpated the groin, did not feel any definite hernia.   RECTAL: Rectal exam demonstrated just a trace of maroon-colored stool. Sphincter tone was good. There was no palpable rectal mass.   EXTREMITIES: No dependent edema.   NEUROLOGIC: Awake, alert, and oriented, moving all extremities.   CLINICAL DATA: Blood culture is no growth. Glucose 108, creatinine 0.87. Liver panel with slightly low albumin of 3.1. White blood count on admission was 11,100 and subsequently 9500. Hemoglobin on admission was 15 and subsequently 12.9. Platelet count on admission was 158,000 and subsequently 115,000. Pro-time 12.9 seconds.   I reviewed her CT images which do demonstrate marked thickening of the wall of the left transverse colon. This appears to stretch out over a distance of some 6 or 8 inches. There is normal caliber of the ascending colon. I did not see any contrast in the sigmoid colon. I did not identify any diverticulosis. There is a small fat containing left inguinal hernia.   IMPRESSION:  1. Acute abdominal pain with colonic bleeding.  2. Colitis involving the transverse colon. 3. Small asymptomatic left inguinal hernia  4. Morbid obesity.  5. Chronic constipation.   I discussed this with her. I do not think there is any indication for any emergency surgery. I recommend continuing with her current management of Cipro, Flagyl, and clear liquids at present. Encouraged walking. Recheck her hemoglobin and platelet count.  If she improves, slowly advance her diet to full liquids and subsequently up to solid food.  I discussed that there may be a role for barium enema after about 4 to 6 weeks recovery to evaluate to see if she has diverticulosis. Another potential role for evaluation of her colon might be colonoscopy, however, it might be best to give some time for initial  recovery.   Will plan to follow while she is in the hospital and make other recommendations as needed.   ____________________________ Shela Commons. Renda Rolls, MD jws:drc D: 03/19/2011 11:10:35 ET T: 03/19/2011 11:39:54 ET JOB#: [id number]  cc: Adella Hare, MD, <Dictator> Adella Hare MD ELECTRONICALLY SIGNED 03/25/2011 12:34

## 2014-05-31 NOTE — Consult Note (Signed)
Chief Complaint:   Subjective/Chief Complaint feeling much better, no rectal bleeding, no diarrhea, but loose stool   VITAL SIGNS/ANCILLARY NOTES: **Vital Signs.:   11-Feb-13 15:26   Vital Signs Type Routine   Temperature Temperature (F) 98.6   Celsius 37   Temperature Source oral   Pulse Pulse 89   Pulse source per Dinamap   Respirations Respirations 18   Systolic BP Systolic BP 114   Diastolic BP (mmHg) Diastolic BP (mmHg) 60   Mean BP 78   BP Source Dinamap   Pulse Ox % Pulse Ox % 94   Pulse Ox Activity Level  At rest   Oxygen Delivery Room Air/ 21 %  *Intake and Output.:   11-Feb-13 17:09   Stool  pt states she has had 3 loose stools today   Brief Assessment:   Cardiac Regular    Respiratory clear BS    Gastrointestinal details normal Soft  Nondistended  No masses palpable  Bowel sounds normal  minimal tenderness   Routine Hem:  11-Feb-13 04:55    Platelet Count (CBC) 134   Assessment/Plan:  Assessment/Plan:   Assessment 1) abdominal pain, acute diarrheal illness much improved.  Colitis-most likely infective.    Plan 1) finish 7-10 day course of abx.  GI follow up in 3-4 weeks, colonoscopy in 4-6 weeks as o/p.   Electronic Signatures: Barnetta ChapelSkulskie, Katasha Riga (MD)  (Signed 11-Feb-13 20:59)  Authored: Chief Complaint, VITAL SIGNS/ANCILLARY NOTES, Brief Assessment, Lab Results, Assessment/Plan   Last Updated: 11-Feb-13 20:59 by Barnetta ChapelSkulskie, Jamier Urbas (MD)

## 2014-05-31 NOTE — Consult Note (Signed)
Brief Consult Note: Diagnosis: abnormal abdominal ct scan, rectal bleeding, abdominal pain.   Patient was seen by consultant.   Consult note dictated.   Recommend further assessment or treatment.   Comments: Patient seen and examined, please see full consult dictated [phone]#[id number].  Patietn admitted with acute onset of abdominal pain, now centered in the luq, followed some hours later with rectal bleeding.  CT showing marked thickening of the distal transverse colon, some affectation of the descending.  DDx colitis, ischemic versus infective, diverticulitis, less likely inflammatory bowel disease.  Currently obn iv abx and feeling a little better, no rectal bleeding today.  Continue current, follow clinically.  Would recommend colonoscopy in 4-6 weeks if she clinically improves, sooner as clinically indicated.  Electronic Signatures: Barnetta ChapelSkulskie, Shannin Naab (MD)  (Signed 10-Feb-13 15:27)  Authored: Brief Consult Note   Last Updated: 10-Feb-13 15:27 by Barnetta ChapelSkulskie, Israel Wunder (MD)

## 2014-05-31 NOTE — Discharge Summary (Signed)
PATIENT NAME:  Michelle Mcgrath, Michelle Mcgrath MR#:  [id number] DATE OF BIRTH:  January 30, 1951  DATE OF ADMISSION:  03/18/2011 DATE OF DISCHARGE:  03/21/2011  ADMITTING DIAGNOSIS: Acute diverticulitis.   DISCHARGE DIAGNOSES:  1. Suspect ischemic colitis. 2. Gastrointestinal bleed.  3. Acute posthemorrhagic anemia.  4. Leukocytosis, resolved.  5. Hypokalemia.  6. Hypomagnesemia.  7. Thrombocytopenia, resolving.   8. Hypertension.  9. Hyperlipidemia.  10. Sinus congestion.  11. History of diverticulosis.  12. History of obstructive sleep apnea. 13. Gastroesophageal reflux disease.  14. Degenerative disc disease.    DISCHARGE CONDITION: Stable.   DISCHARGE MEDICATION: Patient is to resume outpatient medications which is enalapril at previously recommended dose, unfortunately dose is unknown.   ADDITIONAL MEDICATIONS:  1. Omeprazole 40 mg p.o. twice daily.  2. Lactulose 30 mL twice daily as needed to have soft stool daily.  3. Flonase two sprays to both nostrils daily.  4. Lovastatin 20 mg p.o. at bedtime.  5. Percocet 5/325 mg 1 tablet every four hours as needed.  6. Ciprofloxacin 500 mg p.o. twice daily for 10 days.  7. Flagyl 500 mg p.o. 3 times daily for 10 days.  8. Do not take: Patient is not to take aspirin until recommended by primary care physician or GI physician.   HOME OXYGEN: None.   DIET: Puree. Slowly advance to low salt diet.   ACTIVITY LIMITATIONS: As tolerated. .   FOLLOW UP: Follow-up appointment with Dr. Enrigue Catena in two days after discharge as well as Dr. Marva Panda in one week after discharge.    CONSULTANTS:  1. Dr. Marva Panda. 2. Dr. Renda Rolls. 3. Case management. 4. Carmell Austria, PA.   LABORATORY, DIAGNOSTIC AND RADIOLOGICAL DATA: Chest, portable single view, 03/18/2011: No evidence of acute cardiopulmonary abnormality. CT of abdomen and pelvis with contrast 03/19/2011: Diffuse thickening of wall of the transverse colon with poor distention of the lumen. This finding  suggests colitis. Mild thickening of the wall of portions of descending colon is seen as well. No evidence of obstruction or perforation or abscess formation. No acute abnormality of stomach or small bowel though they are mildly distended likely secondary to ileus-type pattern. There is small hiatal hernia. No acute hepatobiliary abnormality. Mild distention of gallbladder likely reflects relative fasting state. There are parapelvic cysts in the left kidney. No evidence of obstruction or inflammatory change of urinary tract were noted.   Patient's lab data revealed mild elevation of glucose of 107, potassium 3.4, otherwise unremarkable BMP. Patient's liver enzymes were normal. White blood cell count was slightly elevated to 11.1, hemoglobin 15.0, platelets 158. Coagulation panel was unremarkable. Blood cultures x1 did not show any growth. Stool culture showed no salmonella or Shigella were isolated in 48 hours. No pathogenic Escherichia coli was also noted. No Campylobacter antigen was detected. C. difficile was also negative. Urine culture showed minimal few organisms results suggestive of contamination. Patient's urinalysis revealed that 2 red blood cells, 4 white blood cells, trace bacteria, 1+ leukocyte esterase.  HISTORY OF PRESENT ILLNESS: Patient is a 64 year old female with past medical history significant for history of diverticulosis, hypertension, obstructive sleep apnea, gastroesophageal reflux disease presented to the hospital with complaints of abdominal pain as well as hematochezia. Please refer to Dr. Melina Schools admission note on 03/18/2011. Apparently patient was doing well up until sudden onset of abdominal pain which woke her up from sleep in the morning of the day of admission. She had emesis in the morning as well as fecal incontinence which was also bloody. She  called EMS and was brought to Emergency Room for further evaluation On arrival to the hospital patient's blood pressure was 131/72,  pulse 81, temperature 98.3, respiration rate was 12, oxygen saturation was 94% on room air. Physical exam revealed soft, diffusely tender on the left side in both upper and lower quadrants abdominal pain. However, no significant tenderness was noted on right. No masses are felt.   HOSPITAL COURSE:  1. Patient was admitted to the hospital when her CT scan of abdomen revealed inflammatory changes in her colon. She was started on IV ciprofloxacin as well as Flagyl as well as pain medications and IV fluids. She was kept on clear liquid diet. With these conservative measures she progressively got better. She was evaluated by Dr. Renda RollsWilton Smith as well as gastroenterologist, Dr. Marva PandaSkulskie. Dr. Marva PandaSkulskie as well as Dr. Renda RollsWilton Smith felt that patient has colitis possibly even ischemic in etiology. Dr. Marva PandaSkulskie as well as Dr. Renda RollsWilton Smith felt that patient would benefit from continuing antibiotic therapy for now as well as conservative management and follow up with gastroenterologist for possible colonoscopy in the next 4 to 6 weeks after discharge. Patient did progressively better as time went on. She was able to tolerate full liquid diet on the day of discharge, 03/21/2011, and desired to return back home. She is being discharged home in stable condition with above-mentioned medications and follow up. She is to follow up with her primary care physician as well as follow up with gastroenterologist in next few weeks after discharge to schedule colonoscopy as outpatient.  2. In regards to gastrointestinal bleed, initially patient's rectal blood was dark and quite copious. Her hemoglobin on arrival to the hospital was 15.0. With rehydration patient's hemoglobin drifted down, however, did not go down too low. On 03/21/2011 patient's hemoglobin level was 12.6. It is recommended to follow patient's hemoglobin levels as outpatient and make decisions about initiation of therapy if needed. We did not feel that patient needs to be  transfused or nor was she ever hemodynamically unstable to be transfused.  3. Patient was noted to have low platelet count on 03/21/2011 with level of 115. It was felt to be due to acute bleeding, however, patient's platelet count improved to 134 by 03/20/2011. It is recommended to follow patient's platelet count as outpatient to ensure its stable levels.  4. Patient was noted also to be hypomagnesemic as well as hypokalemia. Magnesium as well as potassium levels were replenished IV as well as p.o.  5. For hypertension as well as hyperlipidemia, gastroesophageal reflux disease, patient is to continue her outpatient medications. No changes were made here. Initially patient's blood pressure medications were placed on hold, however, later on after she was rehydrated blood pressure medications should be restarted. It was unclear what enalapril dose she was using at home, however, she was advised to return back to her usual enalapril dose.  6. For hyperlipidemia, patient is to continue lovastatin as well as low fat, low cholesterol diet.  7. As patient has history of diverticulosis, lactulose was prescribed for her as needed to have soft stools daily.  8. Because of history of gastroesophageal reflux disease as well as history of recent GI bleed the patient was advised to continue proton pump inhibitors twice daily. She is recommended to follow up with gastroenterologist and make decisions about medications as necessary.  9. Patient is to hold her aspirin therapy until recommended by primary care physician as well as gastroenterologist until later time to be restarted.  10. Her vital signs are stable on day of discharge with temperature 98.3, pulse 82, respiration rate 18, blood pressure 145/73, saturation 98% on room air at rest.   TIME SPENT: 40 minutes.  ____________________________ Katharina Caper, MD rv:cms D: 03/21/2011 20:13:53 ET T: 03/22/2011 10:39:54 ET JOB#: [id number]  cc: Katharina Caper, MD,  <Dictator> Christena Deem, MD Dr. Bonnye Fava MD ELECTRONICALLY SIGNED 04/03/2011 15:24

## 2014-05-31 NOTE — H&P (Signed)
PATIENT NAME:  Michelle Mcgrath, Michelle Mcgrath MR#:  [id number] DATE OF BIRTH:  07-07-1950  DATE OF ADMISSION:  03/18/2011  CHIEF COMPLAINT: Abdominal pain and hematochezia.   HISTORY OF PRESENT ILLNESS: Ms. Michelle Mcgrath is a 64 year old white female with a history of diverticulosis with prior diverticular bleed in 2009 who presents with sudden onset of abdominal pain which woke her from sleep this morning at 4 a.m. She had several episodes of emesis this morning and one episode of fecal incontinence which was mostly blood. She called EMS and had one additional stool prior to their bringing her to the ER which was again accompanied by bright red blood but had more substance to it. She has had chronic constipation and feels that her second stool was more indicative of her chronic situation. She notes that she was eating popcorn and spicy pizza yesterday which may have aggravated her symptoms. She has had on and off symptoms in the past but nothing as severe as today. Her prior history of diverticular bleed occurred about four years ago which [facility] but no surgery was done. She was recently treated for upper respiratory infection with Augmentin back in late January with resolution of symptoms and no diarrhea. She does take a baby aspirin daily.   PRIMARY CARE DOCTOR: [facility]   PAST MEDICAL HISTORY: 1. Diverticulosis. 2. Hypertension. 3. Obstructive sleep apnea awaiting repeat study, not on CPAP currently.  4. Gastroesophageal reflux disease. 5. Degenerative disk disease of the lumbar spine.   MEDICATIONS: 1. Aspirin 81 mg daily.  2. Acetaminophen 500 mg as needed. 3. Percocet 5/325 1 tablet 3 times daily for back pain  4. Flonase nasal spray one spray in each nostril twice daily. 5. Wellbutrin SR 150 mg 2 times daily.  6. Omeprazole 40 mg 1 tablet daily.  7. Cymbalta 60 mg 1 tablet daily.  8. Lovastatin 40 mg 1 tablet daily at bedtime.  9. Hydrochlorothiazide 25 mg 1  tablet daily.  10. Enalapril 20 mg 1 tablet daily.   PAST SURGICAL HISTORY:  1. Hysterectomy in October of 2012 secondary to uterine prolapse.   2. History of tubal ligation.   ALLERGIES: She has no known drug allergies.   LAST [facility]ATION: [facility].   FAMILY HISTORY: Mom and four brothers have hypertension. One brother had a heart attack in his early 2550's. There is no cancer in the family.   SOCIAL HISTORY: She is single. She is a nonsmoker and a nondrinker. She is disabled secondary to her back pain.   REVIEW OF SYSTEMS: Positive for some facial flushing and chills today. No weight loss or weight gain. Positive abdominal pain per history of present illness. No recent visual changes. No recent changes in hearing. She does have a history of snoring. No sinus pain recently. Her cough has resolved. She denies hemoptysis and dyspnea. She denies chest pain, orthopnea, edema, and dyspnea on exertion. She has had nausea and vomiting today along with abdominal pain and hematochezia and bright red blood per rectum. She has no history of hemorrhoids. She denies history of anemia or easy bruising or bleeding except for today's rectal bleeding. She has chronic low back pain which is treated with Percocet. She has weakness and some neuropathy secondary to her back pain. She has a history of anxiety and depression secondary to back pain, chronic pain.   PHYSICAL EXAMINATION:   GENERAL: This is a well nourished, well developed female  who appears in moderate distress.  VITAL SIGNS: Blood pressure 131/72, pulse 81, temperature 98.3, respirations 12 per minute. She is sating 94% on room air. Her initial presenting pain complaint was 10/10 and ranking currently 4.   HEENT: Pupils are equal, round, and reactive to light. Extraocular movements are intact. Sclerae are nonicteric. She has poor dentition. Oral mucosa is dry.   NECK: Supple without lymphadenopathy, JVD, thyromegaly,  or carotid bruits.   LUNGS: Clear anteriorly and posteriorly in all fields.   CARDIOVASCULAR: Normal with no murmurs, rubs, or gallops. There is no lower extremity edema and her pedal pulses are palpable.   ABDOMEN: Soft, diffusely tender on the left side in both upper and lower quadrant, nontender on the right. No masses felt.   MUSCULOSKELETAL: Nonfocal. She is moving all extremities well.   SKIN: Skin is warm and dry without rashes or lesions.   LYMPH: There is no cervical, axillary, inguinal, or supraclavicular lymphadenopathy.   NEUROLOGIC: Grossly nonfocal.   PSYCH: She is alert and oriented to person, place, and time.   ADMISSION LABORATORY DATA: Sodium 134, potassium 3.4, chloride 105, bicarb 30, BUN 17, creatinine 1.06, glucose 107. Liver function tests are normal. White count 11.1, hemoglobin 15.0, platelets 158. PT is normal at 0.9. Lipase is normal at 91. No imaging studies have been done by ER physician.   ASSESSMENT AND PLAN:  1. Abdominal pain with rectal bleeding presumed secondary to diverticular bleed with diverticulitis currently. Will admit to general medical floor, Med/Surg floor. Make n.p.o. Start empiric IV antibiotics. Cycle her hemoglobin every six hours. Will use lactulose to keep her bowels moving. She has been told in the past that if she had another diverticular bleed she would need to have surgery, therefore, will ask General Surgery to see her tomorrow in the event that she does require surgery.  2. Hypertension. While n.p.o. will use IV metoprolol if needed. 12-lead EKG will be done as screening for possible preoperative evaluation.  3. Chronic pain. She is a habitual user of Percocet for chronic back pain. Will continue use of IV medications currently to manage both her back pain and her abdominal pain. She did receive 4 mg of morphine in the ER with good control of pain as well as ondansetron for management of nausea.   ESTIMATED TIME OF CARE: 40 minutes.    ____________________________ Duncan Dull, MD tt:drc D: 03/18/2011 20:58:00 ET T: 03/19/2011 08:35:34 ET JOB#: [id number]  cc: Duncan Dull, MD, <Dictator> Dr. Anselm Jungling at [facility] MD ELECTRONICALLY SIGNED 03/23/2011 14:13

## 2014-05-31 NOTE — Consult Note (Signed)
Chief Complaint:   Subjective/Chief Complaint abdominal pain, hematochezia   Assessment/Plan:  Assessment/Plan:   Assessment 64 yo white female with history of divertiuclar bleed 2009 [facility], no surgical intervention , presents with sudden onset of LUQ/LLQ pain, nausea  and hematochezia.  1) Diverticulitis with diverticular blleed:  suspect5ed, given history and exam.  CT pending,  Gen Surg consult for tomorrow. preop cxr  ordered.  NPO, INV Cipro/Flagyl, Lactulose, Morphine .  2) HTN:  holding meds,  IV lopressor prn.  3) Chronic back pain secondary to DDD; holdng Percocet/Cymbalta   4) Hyperlipidemia:  holding statin while NPO   Electronic Signatures: Duncan Dullullo, Sherl Yzaguirre (MD)  (Signed 09-Feb-13 21:22)  Authored: Chief Complaint, Assessment/Plan   Last Updated: 09-Feb-13 21:22 by Duncan Dullullo, Whitlee Sluder (MD)

## 2014-05-31 NOTE — Consult Note (Signed)
PATIENT NAME:  Michelle Mcgrath, Hatsuko E MR#:  [id number] DATE OF BIRTH:  Aug 30, 1950  DATE OF CONSULTATION:  03/19/2011  REFERRING PHYSICIAN:  Enedina FinnerSona Patel, MD  CONSULTING PHYSICIAN:  Christena DeemMartin U. Shadrach Bartunek, MD  REASON FOR CONSULTATION: GI bleed, abnormal CT, possible colitis.   HISTORY OF PRESENT ILLNESS: Michelle Mcgrath is a 64 year old Caucasian female who was in her usual state of health up until about two days ago. She states that she has been eating a lot of popcorn. Then yesterday morning when she awoke she had some nausea, vomiting, some diarrhea as well as some abdominal pain mostly central in nature. She began to have a bloody stool at about 5 p.m. She states she had a similar episode in about 2009. She has not had other episodes or problems related to nausea, vomiting, or abdominal pain. She usually has a bowel movement about once a day. No black stools, blood in the stools, or slimy stools. She has a history of peptic ulcer disease, remote. She did have a colonoscopy perhaps about five years or so ago at [facility] but is uncertain of the specifics and cannot remember the diagnosis. She does take a daily 81 mg aspirin but denies taking any other aspirin or NSAID products. Currently she continues with some abdominal pain in the left upper quadrant. She does feel a little bit better than when she was admitted to the hospital.   GI FAMILY HISTORY: Pertinent for a brother with alcohol related liver disease. She denies any family history of colon or rectal cancer or ulcers.   PAST MEDICAL HISTORY:  1. Diverticulosis.  2. Hypertension.  3. Obstructive sleep apnea. 4. Gastroesophageal reflux.  5. Degenerative disk disease.   OUTPATIENT MEDICATIONS:  1. Acetaminophen/oxycodone 325/5 mg 4 times a day p.r.n.  2. Aspirin 81 mg. 3. Cymbalta. Neither of these she is apparently currently taking. 4. Enalapril, dose uncertain.  5. Lovastatin which she may not be taking. 6. Omeprazole, dose uncertain.   ALLERGIES: She is  allergic to aspirin per patient. It is of note, however, that she does take an 81 mg aspirin on a daily basis.   OTHER MEDICATIONS: 1. Flonase nasal spray. 2. Wellbutrin SR 150 mg 2 daily. 3. Cymbalta 60 mg a day. 4. Hydrochlorothiazide 25 mg a day.   PAST SURGICAL HISTORY:  1. Hysterectomy in 2012 for uterine prolapse. 2. Previous tubal ligation.   SOCIAL HISTORY: She does not drink alcohol. She does not smoke or use tobacco.   PHYSICAL EXAMINATION:   VITAL SIGNS: Temperature 98.4, pulse 87, respirations 18, blood pressure 122/68, pulse oximetry 96%.   GENERAL: She is a 64 year old Caucasian female in some mild obvious discomfort.   HEENT: Normocephalic, atraumatic.   EYES: Anicteric.   NOSE: Septum midline. No lesions.   OROPHARYNX: No lesions.   NECK: Supple. No JVD.   HEART: Regular rate and rhythm.   LUNGS: Bilaterally clear.   ABDOMEN: Soft. She is markedly tender to palpation in the left upper quadrant. She has some generalized abdominal discomfort otherwise. There are no masses, rebound, or organomegaly.   RECTAL: Anorectal exam is deferred, however, a rectal exam earlier this morning by Dr. Katrinka BlazingSmith did show trace of maroon-colored stools. Sphincter tone was normal. No palpable rectal mass.   LABORATORY, DIAGNOSTIC, AND RADIOLOGICAL DATA: Metabolic panel showed a glucose of 108, BUN 13, creatinine 0.87, sodium 141, potassium 3.5, chloride 104, bicarb 27, calcium 8.3, magnesium 1.4. Hepatic profile showing a total protein of 6.6, albumin 3.1, total bilirubin 0.4,  alkaline phosphatase 50, AST 16, ALT 17. Her hemogram on admission showed a white count of 11.1, this morning shows a white count of 9.5, hemoglobin and hematocrit 12.9/38.7, platelet count of 115. It is of note that when she came to the hospital her hemoglobin was 15.0, hematocrit of 43.8, platelet count of 158. She has had a C. difficile test negative. Blood cultures no growth x1. Her INR was 0.9. Urinalysis  showed 1+ leukocyte esterase, trace bacteria, negative nitrite. She had a portable chest film showing no evidence of acute cardiopulmonary abnormality. She had a CT scan of the abdomen and pelvis with contrast showing diffuse thickening of the wall of the transverse colon with poor distention of his lumen suggesting colitis. Mild thickening of some portions of the wall of the descending colon also seen. No evidence of obstruction, perforation, or abscess. Possible mild ileus pattern. Parapelvic cyst in the left kidney.   ASSESSMENT: Abdominal pain, abnormal CT scan, and rectal bleeding. Differential diagnosis would include diverticulitis and ischemic colitis as well as possible neoplasia and colitis, inflammatory versus infectious. By her presentation I am more suspicious of the first two. The patient is hemodynamically stable.   RECOMMENDATION:  1. Continue antibiotics as current.  2. Will follow clinically.  3. Colonoscopy in about 4 to 6 weeks in follow-up if this episode resolves, sooner if clinically indicated. 4. Will follow with you.   Thank you for this consult.   ____________________________ Christena Deem, MD mus:drc D: 03/19/2011 15:22:57 ET T: 03/19/2011 15:42:26 ET JOB#: [id number]  cc: Christena Deem, MD, <Dictator> Christena Deem MD ELECTRONICALLY SIGNED 04/03/2011 9:15

## 2014-06-01 ENCOUNTER — Ambulatory Visit
Admit: 2014-06-01 | Disposition: A | Discharge status: Home / Self Care | Payer: Self-pay | Attending: Unknown Physician Specialty | Admitting: Unknown Physician Specialty

## 2014-06-01 ENCOUNTER — Other Ambulatory Visit: Payer: Self-pay | Admitting: Family Medicine

## 2014-06-07 NOTE — Op Note (Signed)
PATIENT NAME:  Michelle Mcgrath, Michelle Mcgrath MR#:  [id number] DATE OF BIRTH:  07-Aug-1950  DATE OF PROCEDURE:  06/01/2014  PREOPERATIVE DIAGNOSES:  1.  90% bursal side tear left supraspinatus tendon.  2.  Severe left acromioclavicular arthritis.  3.  Severe impingement syndrome left shoulder.  4.  Severe fraying of the left biceps tendon.  POSTOPERATIVE DIAGNOSES:  1.  90% bursal side tear left supraspinatus tendon.  2.  Severe left acromioclavicular arthritis.  3.  Severe impingement syndrome left shoulder.  4.  Severe fraying of the left biceps tendon.   OPERATIONS:  1. Arthroscopic left rotator cuff repair.  2. Arthroscopic left distal clavicle excision.  3. Left arthroscopic subacromial decompression.  4. Left arthroscopic biceps tenotomy.   SURGEON: Valinda Hoar, MD.   ANESTHESIA: General endotracheal plus interscalene block.   COMPLICATIONS: None.   DRAINS: None.   ESTIMATED BLOOD LOSS: Minimal.   REPLACEMENTS: None.   OPERATIVE FINDINGS: The patient had a large bursal side tear of the rotator cuff with just a few fibers attached to the humeral head on the articular side.  It was a large flap tear. There was severe impingement and bursitis. The [facility] joint had large spurs. The intraarticular part of the joint showed severe fraying of the biceps tendon and the labrum. There was fraying of the anterior labrum as well. Glenohumeral surfaces were normal.   OPERATIVE PROCEDURE: The patient was brought to the operating room where she underwent satisfactory general endotracheal anesthesia after a left interscalene block was instilled. She was turned into the right lateral decubitus position and padded appropriately on the beanbag. The left shoulder was prepped and draped in sterile fashion and placed in 12 pounds of traction. Arthroscopy was carried out from posterior, lateral, and anterior portals. The glenohumeral joint was examined first with the findings as above. The biceps tendon was severely  frayed and the ArthroCare wand was used to release this from the anterior portal. The motorized resector was used to clean up the labrum. The undersurface of the rotator cuff appeared to be intact, but thin when probed with an 18-gauge needle. The arthroscope was redirected into the subacromial space. There was some bursitis present. There was a very large obvious flap tear of the supraspinatus in its anterior portion. By lifting this up we could see that there were only a few fibers volarly that were keeping this intact. The edges of this cuff were trimmed up. The subacromial decompression was carried out with a large burr from a posterior portal. The distal clavicle was excised in its entirety with the large burr as well. Once all debridement was carried out the soft tissues at the tuberosity of the humerus were removed where the cuff had avulsed and a burr was used to freshen up the bone here. Two Magnum wires were inserted into the cuff using a FirstPass device. These were then brought out through a cannula laterally and passed through the Multifix device. The Multifix was then advanced gradually down through the cannula and onto the lateral aspect of the tuberosity. An awl had been used to start an initial hole here. The Multifix device was then advanced into the bone pulling the cuff laterally and covering the tuberosity nicely. The anchor was tightened fully and the sutures were then cut. The cuff repair looked excellent with no prominences. After further irrigation of the joint and the bursa and cautery with the ArthroCare wand the instruments were removed. Stab wounds were closed with 3-0 nylon suture.  0.5% Marcaine with epinephrine and morphine was placed in the joint and the bursa. Dry sterile dressings and TENS pads were applied. A padded sling was applied. The patient was awakened and taken to recovery in good condition.     ____________________________ Valinda HoarHoward E. Dave Mergen, MD hem:bu D: 06/01/2014  15:28:38 ET T: 06/01/2014 21:17:26 ET JOB#: [id number]  cc: Valinda HoarHoward E. Michelle Rachel, MD, <Dictator> Valinda HoarHOWARD E Walfred Bettendorf MD ELECTRONICALLY SIGNED 06/04/2014 10:15

## 2014-06-09 ENCOUNTER — Encounter: Payer: Self-pay | Admitting: Family Medicine

## 2014-06-11 ENCOUNTER — Other Ambulatory Visit: Payer: Self-pay | Admitting: Family Medicine

## 2014-06-19 ENCOUNTER — Other Ambulatory Visit: Payer: Self-pay | Admitting: Family Medicine

## 2014-06-22 ENCOUNTER — Telehealth: Payer: Self-pay | Admitting: *Deleted

## 2014-06-22 NOTE — Telephone Encounter (Signed)
PLEASE NOTE: All timestamps contained within this report are represented as [place] Time. CONFIDENTIALTY NOTICE: This fax transmission is intended only for the addressee. It contains information that is legally privileged, confidential or otherwise protected from use or disclosure. If you are not the intended recipient, you are strictly prohibited from reviewing, disclosing, copying using or disseminating any of this information or taking any action in reliance on or regarding this information. If you have received this fax in error, please notify us immediately by telephone so that we can arrange for its return to us. Phone: [phone], Toll-Free: [phone], Fax: [phone] Page: 1 of 2 Call Id: [id number] [facility] Client TELEPHONE ADVICE RECORD [facility] Patient Name: Michelle CockayneDALE Mcgrath Gender: Female DOB: 01/23/1951 Age: [age] Y 1 M 10 D Return Phone Number: [phone] (Primary) Address: City/State/Zip: [place] Client [facility] Physician Eustaquio BoydenGutierrez, Javier Contact Type Call Call Type Triage / Clinical Relationship To Patient Self Return Phone Number [phone] (Primary) Chief Complaint Cold Symptom Initial Comment Caller states that that he has a chest and head cold. PreDisposition Go to Urgent Care/Walk-In Clinic Nurse Assessment Nurse: Alben SpittleWeaver, RN, Arline Aspindy Date/Time Lamount Cohen(Eastern Time): 06/20/2014 6:52:49 AM Confirm and document reason for call. If symptomatic, describe symptoms. ---Caller states she has a cold. Face hurts, stopped up and coughing. Symptoms started 2-days ago. Has the patient traveled out of the country within the last 30 days? ---No Does the patient require triage? ---Yes Related visit to physician within the last 2 weeks? ---No Does the PT have any chronic conditions? (i.e. diabetes, asthma, etc.) ---Yes List chronic  conditions. ---Hypertension, GERD Guidelines Guideline Title Affirmed Question Affirmed Notes Nurse Date/Time Lamount Cohen(Eastern Time) Common Cold Cold with no complications (all triage questions negative) Alben SpittleWeaver, RN, [facility] 06/20/2014 6:54:30 AM Disp. Time Lamount Cohen(Eastern Time) Disposition Final User 06/20/2014 7:00:59 AM Home Care Yes Alben SpittleWeaver, RN, [address] [facility] Caller Understands: Yes Disagree/Comply: Comply Care Advice Given Per Guideline HOME CARE: You should be able to treat this at home. REASSURANCE: * It sounds like an uncomplicated cold that we can treat at home. * Colds are very common and may make you feel uncomfortable. * Colds are caused by viruses, and no medicine or 'shot' PLEASE NOTE: All timestamps contained within this report are represented as [place] Standard Time. CONFIDENTIALTY NOTICE: This fax transmission is intended only for the addressee. It contains information that is legally privileged, confidential or otherwise protected from use or disclosure. If you are not the intended recipient, you are strictly prohibited from reviewing, disclosing, copying using or disseminating any of this information or taking any action in reliance on or regarding this information. If you have received this fax in error, please notify us immediately by telephone so that we can arrange for its return to us. Phone: [phone], Toll-Free: [phone], Fax: [phone] Page: 2 of 2 Call Id: [id number] Care Advice Given Per Guideline will cure an uncomplicated cold. * Colds are usually not serious. * STEP 1: Lean over a sink. * How it Helps: The salt water rinses out excess mucus, washes out any irritants (dust, allergens) that might be present, and moistens the nasal cavity. * STEP 2: Gently squirt or spray warm salt water into one of your nostrils. * STEP 3: Some of the water may run into the back of your throat. Spit this out. If you swallow the salt water it will not hurt you. *  STEP 4: Blow your nose to clean out  the water and mucus. * STEP 5: Repeat steps 1-4 for the other nostril. You can do this a couple times a day if it seems to help you. * Most cold medicines that are available over-the-counter (OTC) are not helpful. * For muscle aches, headaches, or moderate fever (over 101 degrees F) (38.9 C) use acetaminophen every 4 hours. * Cough: use cough drops. * The cold virus is present in your nasal secretions. CONTAGIOUSNESS: * Cover your nose and mouth with a tissue when you sneeze or cough. * Wash your hands frequently with soap and water. * Nasal discharge 7-14 days * Cough 2-3 weeks. EXPECTED COURSE: CALL BACK IF: * Fever lasts over 3 days * Runny nose lasts over 10 days * You become worse. CARE ADVICE given per Colds (Adult) guideline. After Care Instructions Given Call Event Type User Date / Time Description

## 2014-06-22 NOTE — Telephone Encounter (Signed)
Advised [facility].

## 2014-08-26 ENCOUNTER — Ambulatory Visit (INDEPENDENT_AMBULATORY_CARE_PROVIDER_SITE_OTHER): Payer: Medicare HMO | Admitting: Primary Care

## 2014-08-26 ENCOUNTER — Encounter: Payer: Self-pay | Admitting: Primary Care

## 2014-08-26 VITALS — BP 122/84 | HR 84 | Temp 98.1°F | Ht 62.5 in | Wt 235.8 lb

## 2014-08-26 DIAGNOSIS — R5383 Other fatigue: Secondary | ICD-10-CM | POA: Diagnosis not present

## 2014-08-26 DIAGNOSIS — R05 Cough: Secondary | ICD-10-CM

## 2014-08-26 DIAGNOSIS — R059 Cough, unspecified: Secondary | ICD-10-CM

## 2014-08-26 DIAGNOSIS — G4733 Obstructive sleep apnea (adult) (pediatric): Secondary | ICD-10-CM | POA: Diagnosis not present

## 2014-08-26 MED ORDER — ALBUTEROL SULFATE HFA 108 (90 BASE) MCG/ACT IN AERS: 2.0000 | INHALATION_SPRAY | Freq: Four times a day (QID) | RESPIRATORY_TRACT | Status: AC | PRN

## 2014-08-26 NOTE — Progress Notes (Signed)
Subjective:    Patient ID: Michelle Mcgrath, female    DOB: 07/02/1950, 64 y.o.   MRN: [id number]  HPI  Michelle Mcgrath is a 64 year old female who presents today with a chief complaint of dental pain. Her pain has been present for one week and is located to the top and bottom of left side of her oral cavity. She also reports sore throat which has been present for the same amount of time. She reports her pain is worse with over exertion. She's tried taking tylenol with some relief. She's been eating crushed ice for the past several months to "keep me awake". She hasn't been to the dentist since 1996.    She also reports feeling fatigued and tired for the past year. She has a history of sleep apnea  and has been having difficulty with the mask fitting properly. The mask has been adjusted several times and she plans on contacting the agency for a replacement. She does have some shortness of breath, but mainly daytime tiredness and no energy. Denies dizziness, syncope. She also enjoys eating ice and has been doing so consistently for the past several months.  Review of Systems  Constitutional: Positive for fatigue. Negative for fever and chills.  HENT: Positive for sore throat. Negative for rhinorrhea.        Dental and gum pain.  Respiratory: Negative for cough.   Musculoskeletal: Positive for myalgias.  Neurological: Negative for dizziness and syncope.       Past Medical History  Diagnosis Date  . Fibromyalgia     no mention of this from prior pcp  . Depression   . GERD (gastroesophageal reflux disease)   . History of stomach ulcers   . Allergic rhinitis     environmental  . Hypertension   . HLD (hyperlipidemia)   . History of migraine     none since better BP control  . History of rheumatic fever   . RLS (restless legs syndrome)     on requip  . OSA (obstructive sleep apnea)     sleep study 2013, did not tolerate CPAP. sleep study 2016 CPAP 17 cm H2O with Fisher Paykel simplus medium  sized full face mask ([facility])  . Osteoarthritis     s/p knee injections  . PVD (posterior vitreous detachment)   . Chronic lower back pain     MRI with annular tear L4/5 with disc protrusion s/p 1 ESI did not tolerate ([facility])  . History of hydronephrosis     left with ARF, ?due to pelvic organ prolapse,resolved  . Obesity 03/07/2013  . DDD (degenerative disc disease), lumbar   . Diverticulosis 2008    mild by CT 2008    History   Social History  . Marital Status: Married    Spouse Name: N/A  . Number of Children: N/A  . Years of Education: N/A   Occupational History  . Disability    Social History Main Topics  . Smoking status: Never Smoker   . Smokeless tobacco: Never Used  . Alcohol Use: No  . Drug Use: No  . Sexual Activity: Not on file   Other Topics Concern  . Not on file   Social History Narrative   Lives with husband, 1 dog   Grown children (2 sons, youngest died with bladder cancer)   Occ: disability for FM, lower back pain, depression   Edu: 8th grade   Activity: no regular exercise   Diet:  Past Surgical History  Procedure Laterality Date  . Total abdominal hysterectomy  2012    complete, bladder drop  . Tonsillectomy and adenoidectomy  1992  . Colonoscopy  03/2006    few diverticula [facility])  . Esophagogastroduodenoscopy  03/2006    HH, erythematous gastropathy [facility])  . Cardiovascular stress test  2008     [facility])  . Sleep study  04/2011    AHI 64.6, severe OSA, improved with CPAP 17cm H2O  . Shoulder arthroscopy w/ rotator cuff repair Left 05/2014    almost complete bursal tear [facility])    Family History  Problem Relation Age of Onset  . Hypertension Son   . Arthritis Son   . CAD Brother 55    stents, CABG  . Stroke Neg Hx   . Diabetes Neg Hx   . Cancer Son     bladder (smoker)    Allergies  Allergen Reactions  . Codeine Other (See Comments)    Headache, nausea  . Gabapentin Other (See Comments)    oversedation  .  Lyrica [Pregabalin] Other (See Comments)    Swelling and general fatigue    Current Outpatient Prescriptions on File Prior to Visit  Medication Sig Dispense Refill  . acetaminophen (TYLENOL) 500 MG tablet Take 500 mg by mouth every 6 (six) hours as needed for pain.    Marland Kitchen aspirin 81 MG tablet Take 81 mg by mouth daily.    Marland Kitchen buPROPion (WELLBUTRIN SR) 150 MG 12 hr tablet Take 150 mg by mouth 2 (two) times daily.    . enalapril (VASOTEC) 20 MG tablet TAKE 1 TABLET (20 MG TOTAL) BY MOUTH DAILY. 30 tablet 6  . enalapril (VASOTEC) 20 MG tablet TAKE 1 TABLET (20 MG TOTAL) BY MOUTH DAILY. 30 tablet 3  . fluticasone (FLONASE) 50 MCG/ACT nasal spray Place 2 sprays into both nostrils daily. 16 g 3  . hydrochlorothiazide (HYDRODIURIL) 25 MG tablet TAKE 1 TABLET (25 MG TOTAL) BY MOUTH DAILY. 90 tablet 1  . loratadine (CLARITIN) 10 MG tablet Take 10 mg by mouth daily.    Marland Kitchen lovastatin (MEVACOR) 40 MG tablet TAKE 1 TABLET (40 MG TOTAL) BY MOUTH AT BEDTIME. 90 tablet 1  . methocarbamol (ROBAXIN) 500 MG tablet Take 500 mg by mouth 2 (two) times daily.  3  . omeprazole (PRILOSEC) 40 MG capsule TAKE 1 CAPSULE BY MOUTH EVERY DAY 90 capsule 1  . ranitidine (ZANTAC) 75 MG tablet Take 75 mg by mouth at bedtime.    . sertraline (ZOLOFT) 25 MG tablet TAKE 1 TABLET (25 MG TOTAL) BY MOUTH DAILY. 30 tablet 6  . naproxen (NAPROSYN) 500 MG tablet Take one po bid x 1 week then prn pain, take with food (Patient not taking: Reported on 03/13/2014) 60 tablet 0  . rOPINIRole (REQUIP) 1 MG tablet TAKE 1 TABLET (1 MG TOTAL) BY MOUTH AT BEDTIME. (Patient not taking: Reported on 03/13/2014) 30 tablet 3   No current facility-administered medications on file prior to visit.    BP 122/84 mmHg  Pulse 84  Temp(Src) 98.1 F (36.7 C) (Oral)  Ht 5' 2.5" (1.588 m)  Wt 235 lb 12.8 oz (106.958 kg)  BMI 42.41 kg/m2  SpO2 96%    Objective:   Physical Exam  Constitutional: She is oriented to person, place, and time. She appears  well-nourished. She does not appear ill.  HENT:  Right Ear: Tympanic membrane and ear canal normal.  Left Ear: Tympanic membrane and ear canal normal.  Nose: Right sinus exhibits no maxillary sinus tenderness and no frontal sinus tenderness. Left sinus exhibits no maxillary sinus tenderness and no frontal sinus tenderness.  Mouth/Throat: Oropharynx is clear and moist.  Several teeth missing to oral cavity. Several teeth that appear cracked and rotten. No bleeding, inflammation or acute infection noted.  Neck: Neck supple.  Cardiovascular: Normal rate and regular rhythm.   Pulmonary/Chest: Effort normal and breath sounds normal.  Lymphadenopathy:    She has no cervical adenopathy.  Neurological: She is alert and oriented to person, place, and time.  Good strength bilaterally  Skin: Skin is warm and dry.          Assessment & Plan:  Dental pain:  Pain to left upper and lower section of oral cavity. No acute infection or inflammation. Missing and cracked teeth that appear to be chronic. Highly recommended she follow up with a dentist as she has not been in 20 years. Ibuprofen for pain and inflammation. Follow up PRN

## 2014-08-26 NOTE — Assessment & Plan Note (Signed)
Present for 1 year per patient. Could be related to OSA and poorly fitting CPAP. She is to have this evaluated. CBC, TSH, and CMP today to rule out metabolic causes. She has been craving ice for the past several months.  Will treat based off labs if necessary. She is to follow up with PCP in 1 month for re-evaluation.

## 2014-08-26 NOTE — Progress Notes (Signed)
Pre visit review using our clinic review tool, if applicable. No additional management support is needed unless otherwise documented below in the visit note. 

## 2014-08-26 NOTE — Patient Instructions (Addendum)
Your throat and teeth do not appear to be infected; however I do highly recommend you visit the dentist very soon.  Complete lab work prior to leaving today. I will notify you of your results.  Please contact the agency to replace/fit your CPAP mask.  Follow up with PCP in 1 month for re-evaluation of fatigue.  It was very nice to meet you!

## 2014-08-26 NOTE — Assessment & Plan Note (Signed)
Suspect this may be contributing to her fatigue and tiredness. She endorses a poorly fitting CPAP mask. She is to call the agency to have an assessment. If the mask continues to not fit properly, will refer to pulmonology for further evaluation. She has been inconsistent with machine for nearly 1 year.

## 2014-08-27 ENCOUNTER — Other Ambulatory Visit: Payer: Self-pay | Admitting: Primary Care

## 2014-08-27 DIAGNOSIS — R5383 Other fatigue: Secondary | ICD-10-CM

## 2014-08-27 LAB — CBC
HEMATOCRIT: 40.8 % (ref 36.0–46.0)
HEMOGLOBIN: 13.8 g/dL (ref 12.0–15.0)
MCHC: 33.8 g/dL (ref 30.0–36.0)
MCV: 82.7 fl (ref 78.0–100.0)
PLATELETS: 136 10*3/uL — AB (ref 150.0–400.0)
RBC: 4.93 Mil/uL (ref 3.87–5.11)
RDW: 14.8 % (ref 11.5–15.5)
WBC: 10 10*3/uL (ref 4.0–10.5)

## 2014-08-27 LAB — COMPREHENSIVE METABOLIC PANEL
ALBUMIN: 4.1 g/dL (ref 3.5–5.2)
ALT: 14 U/L (ref 0–35)
AST: 16 U/L (ref 0–37)
Alkaline Phosphatase: 68 U/L (ref 39–117)
BUN: 18 mg/dL (ref 6–23)
CALCIUM: 9.5 mg/dL (ref 8.4–10.5)
CHLORIDE: 100 meq/L (ref 96–112)
CO2: 33 mEq/L — ABNORMAL HIGH (ref 19–32)
Creatinine, Ser: 0.9 mg/dL (ref 0.40–1.20)
GFR: 66.52 mL/min (ref >60.00)
GLUCOSE: 96 mg/dL (ref 70–99)
POTASSIUM: 4 meq/L (ref 3.5–5.1)
Sodium: 140 mEq/L (ref 135–145)
TOTAL PROTEIN: 7.1 g/dL (ref 6.0–8.3)
Total Bilirubin: 0.3 mg/dL (ref 0.2–1.2)

## 2014-08-27 LAB — TSH: TSH: 2.42 u[IU]/mL (ref 0.35–4.50)

## 2014-08-31 ENCOUNTER — Telehealth: Payer: Self-pay | Admitting: Family Medicine

## 2014-08-31 NOTE — Telephone Encounter (Signed)
Noted. Did she go to the ER at [facility] med?

## 2014-08-31 NOTE — Telephone Encounter (Signed)
Patient Name: Michelle Mcgrath DOB: 1950/11/17 Initial Comment Caller states she has left arm and side pain, gets short of breath, blood test shows low platelets Nurse Assessment Nurse: Charna Elizabeth, RN, Cathy Date/Time (Eastern Time): 08/31/2014 12:58:56 PM Confirm and document reason for call. If symptomatic, describe symptoms. ---Caller states she developed shortness of breath last week that worse today. Has the patient traveled out of the country within the last 30 days? ---No Does the patient require triage? ---Yes Related visit to physician within the last 2 weeks? ---No Does the PT have any chronic conditions? (i.e. diabetes, asthma, etc.) ---Yes List chronic conditions. ---Acid Reflux, High Blood Pressure, Depression, Bronchitis about a month ago Guidelines Guideline Title Affirmed Question Affirmed Notes Breathing Difficulty SEVERE difficulty breathing (e.g., struggling for each breath, speaks in single words) struggling for each breath Final Disposition User Call EMS 911 Now Charna Elizabeth, RN, Lynden Ang Disagree/Comply: Disagree Disagree/Comply Reason: Disagree with instructions Caller states she is out of town and will have her husband drive her to [facility] ER now.

## 2014-09-01 NOTE — Telephone Encounter (Signed)
Tried calling patient this morning and this afternoon. No one answer. Left voicemail.

## 2014-09-02 ENCOUNTER — Encounter: Payer: Self-pay | Admitting: Family Medicine

## 2014-09-02 ENCOUNTER — Ambulatory Visit (INDEPENDENT_AMBULATORY_CARE_PROVIDER_SITE_OTHER): Payer: Medicare HMO | Admitting: Family Medicine

## 2014-09-02 VITALS — BP 126/84 | HR 82 | Temp 98.4°F | Ht 62.5 in | Wt 239.5 lb

## 2014-09-02 DIAGNOSIS — I10 Essential (primary) hypertension: Secondary | ICD-10-CM | POA: Diagnosis not present

## 2014-09-02 DIAGNOSIS — R5383 Other fatigue: Secondary | ICD-10-CM | POA: Diagnosis not present

## 2014-09-02 DIAGNOSIS — R0602 Shortness of breath: Secondary | ICD-10-CM

## 2014-09-02 DIAGNOSIS — R0609 Other forms of dyspnea: Secondary | ICD-10-CM | POA: Diagnosis not present

## 2014-09-02 LAB — BRAIN NATRIURETIC PEPTIDE: PRO B NATRI PEPTIDE: 68 pg/mL (ref 0.0–100.0)

## 2014-09-02 NOTE — Assessment & Plan Note (Signed)
Looks to be well controlled BP Readings from Last 3 Encounters:  09/02/14 126/84  08/26/14 122/84  03/13/14 141/83    Urged to dec sodium and processed food in diet and work on wt loss

## 2014-09-02 NOTE — Progress Notes (Signed)
Pre visit review using our clinic review tool, if applicable. No additional management support is needed unless otherwise documented below in the visit note. 

## 2014-09-02 NOTE — Assessment & Plan Note (Signed)
Reassuring exam and EKG  Obese with cardiac risk factors and admits to bilateral shoulder and neck/throat discomfort when she walks (new for past 2 wk) Worse pedal edema when hot  BNP today  Ref to cardiol for further eval as well  inst to go to ED if symptoms return or persist   Also inst to get her cpap fixed (she is in the processed)

## 2014-09-02 NOTE — Progress Notes (Signed)
Subjective:    Patient ID: Michelle Mcgrath, female    DOB: 08-12-1950, 64 y.o.   MRN: [id number]  HPI Saw Michelle Mcgrath a week ago   Talked about dental issues -has not addressed CPAP needed re eval -has not addressed    Had labs  Results for orders placed or performed in visit on 08/26/14  TSH  Result Value Ref Range   TSH 2.42 0.35 - 4.50 uIU/mL  CBC  Result Value Ref Range   WBC 10.0 4.0 - 10.5 K/uL   RBC 4.93 3.87 - 5.11 Mil/uL   Platelets 136.0 (L) 150.0 - 400.0 K/uL   Hemoglobin 13.8 12.0 - 15.0 g/dL   HCT 40.9 81.1 - 91.4 %   MCV 82.7 78.0 - 100.0 fl   MCHC 33.8 30.0 - 36.0 g/dL   RDW 78.2 95.6 - 21.3 %  Comprehensive metabolic panel  Result Value Ref Range   Sodium 140 135 - 145 mEq/L   Potassium 4.0 3.5 - 5.1 mEq/L   Chloride 100 96 - 112 mEq/L   CO2 33 (H) 19 - 32 mEq/L   Glucose, Bld 96 70 - 99 mg/dL   BUN 18 6 - 23 mg/dL   Creatinine, Ser 0.86 0.40 - 1.20 mg/dL   Total Bilirubin 0.3 0.2 - 1.2 mg/dL   Alkaline Phosphatase 68 39 - 117 U/L   AST 16 0 - 37 U/L   ALT 14 0 - 35 U/L   Total Protein 7.1 6.0 - 8.3 g/dL   Albumin 4.1 3.5 - 5.2 g/dL   Calcium 9.5 8.4 - 57.8 mg/dL   GFR 46.96 >29.52 mL/min     Has had shoulder pain when walking - and also in throat area/neck  Sob on exertion  Some swelling in legs  No hx of heart problems   Non smoker  Never smoked  Dental issues  No family hx of heart problems Takes chol med  On bp med  BP Readings from Last 3 Encounters:  09/02/14 126/84  08/26/14 122/84  03/13/14 141/83   also obesity   Gained 4 lb this last week   She last had a stress test long ago -it was fine    Of note- had shoulder surgery L -ever since then a heaviness in her arms    Today EKG shows NSR with rate of 77  Patient Active Problem List   Diagnosis Date Noted  . Dyspnea on exertion 09/02/2014  . Fatigue 08/26/2014  . Left-sided chest wall pain 03/13/2014  . Left shoulder pain 03/13/2014  . Shortness of breath 03/13/2014  .  Welcome to Medicare preventive visit 12/01/2013  . Advanced care planning/counseling discussion 12/01/2013  . Pedal edema 10/20/2013  . OSA (obstructive sleep apnea) 10/02/2013  . Arm pain, left 10/02/2013  . Right foot pain 08/14/2013  . Right knee pain 08/14/2013  . Left sided abdominal pain 06/09/2013  . Cough 03/07/2013  . Morbid obesity 03/07/2013  . Allergic rhinitis   . Arthritis   . Fibromyalgia   . Depression   . GERD (gastroesophageal reflux disease)   . Hypertension   . HLD (hyperlipidemia)   . RLS (restless legs syndrome)    Past Medical History  Diagnosis Date  . Fibromyalgia     no mention of this from prior pcp  . Depression   . GERD (gastroesophageal reflux disease)   . History of stomach ulcers   . Allergic rhinitis     environmental  . Hypertension   .  HLD (hyperlipidemia)   . History of migraine     none since better BP control  . History of rheumatic fever   . RLS (restless legs syndrome)     on requip  . OSA (obstructive sleep apnea)     sleep study 2013, did not tolerate CPAP. sleep study 2016 CPAP 17 cm H2O with Fisher Paykel simplus medium sized full face mask ([facility])  . Osteoarthritis     s/p knee injections  . PVD (posterior vitreous detachment)   . Chronic lower back pain     MRI with annular tear L4/5 with disc protrusion s/p 1 ESI did not tolerate ([facility])  . History of hydronephrosis     left with ARF, ?due to pelvic organ prolapse,resolved  . Obesity 03/07/2013  . DDD (degenerative disc disease), lumbar   . Diverticulosis 2008    mild by CT 2008   Past Surgical History  Procedure Laterality Date  . Total abdominal hysterectomy  2012    complete, bladder drop  . Tonsillectomy and adenoidectomy  1992  . Colonoscopy  03/2006    few diverticula [facility])  . Esophagogastroduodenoscopy  03/2006    HH, erythematous gastropathy [facility])  . Cardiovascular stress test  2008     WNL [facility])  . Sleep study  04/2011    AHI 64.6, severe OSA,  improved with CPAP 17cm H2O  . Shoulder arthroscopy w/ rotator cuff repair Left 05/2014    almost complete bursal tear [facility])   History  Substance Use Topics  . Smoking status: Never Smoker   . Smokeless tobacco: Never Used  . Alcohol Use: No   Family History  Problem Relation Age of Onset  . Hypertension Son   . Arthritis Son   . CAD Brother 55    stents, CABG  . Stroke Neg Hx   . Diabetes Neg Hx   . Cancer Son     bladder (smoker)   Allergies  Allergen Reactions  . Codeine Other (See Comments)    Headache, nausea  . Gabapentin Other (See Comments)    oversedation  . Lyrica [Pregabalin] Other (See Comments)    Swelling and general fatigue   Current Outpatient Prescriptions on File Prior to Visit  Medication Sig Dispense Refill  . acetaminophen (TYLENOL) 500 MG tablet Take 500 mg by mouth every 6 (six) hours as needed for pain.    Marland Kitchen albuterol (PROVENTIL HFA;VENTOLIN HFA) 108 (90 BASE) MCG/ACT inhaler Inhale 2 puffs into the lungs every 6 (six) hours as needed for wheezing or shortness of breath. 1 Inhaler 0  . aspirin 81 MG tablet Take 81 mg by mouth daily.    Marland Kitchen buPROPion (WELLBUTRIN SR) 150 MG 12 hr tablet Take 150 mg by mouth 2 (two) times daily.    . enalapril (VASOTEC) 20 MG tablet TAKE 1 TABLET (20 MG TOTAL) BY MOUTH DAILY. 30 tablet 6  . enalapril (VASOTEC) 20 MG tablet TAKE 1 TABLET (20 MG TOTAL) BY MOUTH DAILY. 30 tablet 3  . fluticasone (FLONASE) 50 MCG/ACT nasal spray Place 2 sprays into both nostrils daily. 16 g 3  . hydrochlorothiazide (HYDRODIURIL) 25 MG tablet TAKE 1 TABLET (25 MG TOTAL) BY MOUTH DAILY. 90 tablet 1  . loratadine (CLARITIN) 10 MG tablet Take 10 mg by mouth daily.    Marland Kitchen lovastatin (MEVACOR) 40 MG tablet TAKE 1 TABLET (40 MG TOTAL) BY MOUTH AT BEDTIME. 90 tablet 1  . methocarbamol (ROBAXIN) 500 MG tablet Take 500 mg by mouth  2 (two) times daily.  3  . naproxen (NAPROSYN) 500 MG tablet Take one po bid x 1 week then prn pain, take with food 60  tablet 0  . omeprazole (PRILOSEC) 40 MG capsule TAKE 1 CAPSULE BY MOUTH EVERY DAY 90 capsule 1  . ranitidine (ZANTAC) 75 MG tablet Take 75 mg by mouth at bedtime.    Marland Kitchen rOPINIRole (REQUIP) 1 MG tablet TAKE 1 TABLET (1 MG TOTAL) BY MOUTH AT BEDTIME. 30 tablet 3  . sertraline (ZOLOFT) 25 MG tablet TAKE 1 TABLET (25 MG TOTAL) BY MOUTH DAILY. 30 tablet 6   No current facility-administered medications on file prior to visit.      Review of Systems Review of Systems  Constitutional: Negative for fever, appetite change, and unexpected weight change.  ENT neg for sinus pain or congestion  Eyes: Negative for pain and visual disturbance.  Respiratory: Negative for cough and pos for sob on exertion , neg for wheeze  Cardiovascular: Negative for cp or palpitations pos for exertional shoulder and neck pain, neg for PND or orthopnea    Gastrointestinal: Negative for nausea, diarrhea and constipation.  Genitourinary: Negative for urgency and frequency.  Skin: Negative for pallor or rash   Neurological: Negative for weakness, light-headedness, numbness and headaches.  Hematological: Negative for adenopathy. Does not bruise/bleed easily.  Psychiatric/Behavioral: Negative for dysphoric mood. The patient is not nervous/anxious.         Objective:   Physical Exam  Constitutional: She appears well-developed and well-nourished. No distress.  Morbidly obese and well appearing   HENT:  Head: Normocephalic and atraumatic.  Mouth/Throat: Oropharynx is clear and moist. No oropharyngeal exudate.  Very poor dentition  Throat clear   Eyes: Conjunctivae and EOM are normal. Pupils are equal, round, and reactive to light.  Neck: Normal range of motion. Neck supple. No JVD present. Carotid bruit is not present. No thyromegaly present.  Cardiovascular: Normal rate, regular rhythm, normal heart sounds and intact distal pulses.  Exam reveals no gallop.   Pulmonary/Chest: Effort normal and breath sounds normal. No  respiratory distress. She has no wheezes. She has no rales. She exhibits no tenderness.  No crackles  Abdominal: Soft. Bowel sounds are normal. She exhibits no distension, no abdominal bruit and no mass. There is no tenderness.  Musculoskeletal: She exhibits edema. She exhibits no tenderness.  No warmth or palpable cords   Trace pitting edema in ankles   Lymphadenopathy:    She has no cervical adenopathy.  Neurological: She is alert. She has normal reflexes.  Skin: Skin is warm and dry. No rash noted.  Psychiatric: She has a normal mood and affect.          Assessment & Plan:   Problem List Items Addressed This Visit    Dyspnea on exertion    Reassuring exam and EKG  Obese with cardiac risk factors and admits to bilateral shoulder and neck/throat discomfort when she walks (new for past 2 wk) Worse pedal edema when hot  BNP today  Ref to cardiol for further eval as well  inst to go to ED if symptoms return or persist   Also inst to get her cpap fixed (she is in the processed)      Relevant Orders   Brain natriuretic peptide (Completed)   Ambulatory referral to Cardiology   Fatigue - Primary    Rev labs and last visit  Checking BNP today in light of sob on exertion  Morbid obesity and OSA may  contribute (she is working on getting cpap mask fixed)       Hypertension    Looks to be well controlled BP Readings from Last 3 Encounters:  09/02/14 126/84  08/26/14 122/84  03/13/14 141/83    Urged to dec sodium and processed food in diet and work on wt loss       Shortness of breath   Relevant Orders   EKG 12-Lead (Completed)

## 2014-09-02 NOTE — Assessment & Plan Note (Signed)
Rev labs and last visit  Checking BNP today in light of sob on exertion  Morbid obesity and OSA may contribute (she is working on getting cpap mask fixed)

## 2014-09-02 NOTE — Patient Instructions (Signed)
Today - lab test for BNP (this is a test for heart failure)  EKG is ok - but I am concerned about your symptoms while walking  Do work on weight loss  Do take care of the CPAP mask issue as planned  Please get to a dentist for dental issues when you are able Stop at check out for referral to cardiology   If symptoms suddenly worsen please go to the ER

## 2014-09-03 ENCOUNTER — Telehealth: Payer: Self-pay | Admitting: Family Medicine

## 2014-09-03 NOTE — Telephone Encounter (Signed)
Pt returned your call.  

## 2014-09-03 NOTE — Telephone Encounter (Signed)
Addressed through result notes  

## 2014-09-06 NOTE — Progress Notes (Signed)
Cardiology Office Note   Date:  09/07/2014   ID:  Michelle, Mcgrath 1950/12/24, MRN [id number]  PCP:  Eustaquio Boyden, MD  Cardiologist:   Madilyn Hook, MD   Chief Complaint  Patient presents with  . Establish Care    short of breath with minimal excersion,swelling of ankles and hands.no other concers      History of Present Illness: Michelle Mcgrath is a 64 y.o. female with HTN, HL, and OSA who presents for evaluation of shortness of breath.   She notes SOB with exertion or when going outside.  Three weeks ago she noted an aching sensation in the jaws and throat and L arm pain that radiated down to her fingers.  The pain was achy and 8/10 in severity.  She also notes aching in her shoulder.  The episodes last around one minute.  The symptoms occur mostly with exertion.  She also noted that her heart was racing but denied lightheadedness, diaphoresis or nausea.  The episodes occur twice daily, usually when she is trying to be active.  She has limited her physical activity to avoid these episodes.    Michelle. Mcgrath endoreses LE edema and hand swelling x 6 months. She denies orthopnea, PND or weight change.  Of note, she had L shoulder surgery in April.  Michelle. Mcgrath has been evaluated by her PCP, who noted that she has LE edema and ordered a BNP.  BNP was wnl at 66.  She wears her CPAP machine regularly.   Past Medical History  Diagnosis Date  . Fibromyalgia     no mention of this from prior pcp  . Depression   . GERD (gastroesophageal reflux disease)   . History of stomach ulcers   . Allergic rhinitis     environmental  . Hypertension   . HLD (hyperlipidemia)   . History of migraine     none since better BP control  . History of rheumatic fever   . RLS (restless legs syndrome)     on requip  . OSA (obstructive sleep apnea)     sleep study 2013, did not tolerate CPAP. sleep study 2016 CPAP 17 cm H2O with Fisher Paykel simplus medium sized full face mask ([facility])  .  Osteoarthritis     s/p knee injections  . PVD (posterior vitreous detachment)   . Chronic lower back pain     MRI with annular tear L4/5 with disc protrusion s/p 1 ESI did not tolerate ([facility])  . History of hydronephrosis     left with ARF, ?due to pelvic organ prolapse,resolved  . Obesity 03/07/2013  . DDD (degenerative disc disease), lumbar   . Diverticulosis 2008    mild by CT 2008    Past Surgical History  Procedure Laterality Date  . Total abdominal hysterectomy  2012    complete, bladder drop  . Tonsillectomy and adenoidectomy  1992  . Colonoscopy  03/2006    few diverticula [facility])  . Esophagogastroduodenoscopy  03/2006    HH, erythematous gastropathy [facility])  . Cardiovascular stress test  2008     WNL [place])  . Sleep study  04/2011    AHI 64.6, severe OSA, improved with CPAP 17cm H2O  . Shoulder arthroscopy w/ rotator cuff repair Left 05/2014    almost complete bursal tear Michelle Mcgrath)     Current Outpatient Prescriptions  Medication Sig Dispense Refill  . acetaminophen (TYLENOL) 500 MG tablet Take 500 mg by mouth every 6 (  six) hours as needed for pain.    Marland Kitchen albuterol (PROVENTIL HFA;VENTOLIN HFA) 108 (90 BASE) MCG/ACT inhaler Inhale 2 puffs into the lungs every 6 (six) hours as needed for wheezing or shortness of breath. 1 Inhaler 0  . aspirin 81 MG tablet Take 81 mg by mouth daily.    Marland Kitchen buPROPion (WELLBUTRIN SR) 150 MG 12 hr tablet Take 150 mg by mouth 2 (two) times daily.    . enalapril (VASOTEC) 20 MG tablet TAKE 1 TABLET (20 MG TOTAL) BY MOUTH DAILY. 30 tablet 3  . fluticasone (FLONASE) 50 MCG/ACT nasal spray Place 2 sprays into both nostrils daily. 16 g 3  . hydrochlorothiazide (HYDRODIURIL) 25 MG tablet TAKE 1 TABLET (25 MG TOTAL) BY MOUTH DAILY. 90 tablet 1  . loratadine (CLARITIN) 10 MG tablet Take 10 mg by mouth daily.    Marland Kitchen lovastatin (MEVACOR) 40 MG tablet TAKE 1 TABLET (40 MG TOTAL) BY MOUTH AT BEDTIME. 90 tablet 1  . methocarbamol (ROBAXIN) 500 MG tablet  Take 500 mg by mouth 2 (two) times daily.  3  . naproxen (NAPROSYN) 500 MG tablet Take one po bid x 1 week then prn pain, take with food 60 tablet 0  . omeprazole (PRILOSEC) 40 MG capsule TAKE 1 CAPSULE BY MOUTH EVERY DAY 90 capsule 1  . ranitidine (ZANTAC) 75 MG tablet Take 75 mg by mouth at bedtime.    Marland Kitchen rOPINIRole (REQUIP) 1 MG tablet TAKE 1 TABLET (1 MG TOTAL) BY MOUTH AT BEDTIME. 30 tablet 3  . sertraline (ZOLOFT) 25 MG tablet TAKE 1 TABLET (25 MG TOTAL) BY MOUTH DAILY. 30 tablet 6  . meloxicam (MOBIC) 7.5 MG tablet Take 7.5 mg by mouth daily.  3   No current facility-administered medications for this visit.    Allergies:   Codeine; Gabapentin; and Lyrica    Social History:  The patient  reports that she has never smoked. She has never used smokeless tobacco. She reports that she does not drink alcohol or use illicit drugs.   Family History:  The patient's family history includes Arthritis in her son; CAD (age of onset: 95) in her brother; Cancer in her son; Hypertension in her brother and son; Kidney failure in her mother; Other in her brother. There is no history of Stroke or Diabetes.    ROS:  Please see the history of present illness.   Otherwise, review of systems are positive for none.   All other systems are reviewed and negative.    PHYSICAL EXAM: VS:  BP 102/76 mmHg  Pulse 79  Ht 5\' 3"  (1.6 m)  Wt 107.865 kg (237 lb 12.8 oz)  BMI 42.13 kg/m2 , BMI Body mass index is 42.13 kg/(m^2). GENERAL:  Well appearing HEENT:  Pupils equal round and reactive, fundi not visualized, oral mucosa unremarkable NECK:  No jugular venous distention, waveform within normal limits, carotid upstroke brisk and symmetric, no bruits, no thyromegaly LYMPHATICS:  No cervical adenopathy LUNGS:  Clear to auscultation bilaterally CHEST:  Unremarkable HEART:  PMI not displaced or sustained,S1 and S2 within normal limits, no S3, no S4, no clicks, no rubs, no murmurs ABD:  Flat, positive bowel sounds  normal in frequency in pitch, no bruits, no rebound, no guarding, no midline pulsatile mass, no hepatomegaly, no splenomegaly EXT:  2 plus pulses throughout, no edema, no cyanosis no clubbing SKIN:  No rashes no nodules NEURO:  Cranial nerves II through XII grossly intact, motor grossly intact throughout PSYCH:  Cognitively intact, oriented  to person place and time    EKG:  EKG is ordered today. The ekg ordered today demonstrates Sinus rhythm at 79bpm.  ?lead V2 lead placement.   Recent Labs: 08/26/2014: ALT 14; BUN 18; Creatinine, Ser 0.90; Hemoglobin 13.8; Platelets 136.0*; Potassium 4.0; Sodium 140; TSH 2.42 09/02/2014: Pro B Natriuretic peptide (BNP) 68.0    Lipid Panel    Component Value Date/Time   CHOL 168 10/03/2013 0843   TRIG 119.0 10/03/2013 0843   HDL 37.20* 10/03/2013 0843   CHOLHDL 5 10/03/2013 0843   VLDL 23.8 10/03/2013 0843   LDLCALC 107* 10/03/2013 0843      Wt Readings from Last 3 Encounters:  09/07/14 107.865 kg (237 lb 12.8 oz)  09/02/14 108.636 kg (239 lb 8 oz)  08/26/14 106.958 kg (235 lb 12.8 oz)      Other studies Reviewed: Additional studies/ records that were reviewed today include: labs. Review of the above records demonstrates:  Please see elsewhere in the note.     ASSESSMENT AND PLAN:  # Shortness of breath: Given that Michelle. Denker' symptoms are exertional and associated with L arm and jaw discomfort, her symptoms are concerning for angina.  She has risk factors including age, obesity, family history, HTN and HL.  Therefore, will pursue stress testing.  Heart failure is an unlikely cause of her dyspnea given her normal BNP.  However, obesity can cause BNP to be falsely lowered. - Treadmill stress - Continue ASA and statin - Trial of nitroglycerin 0.4mg  q57min prn  # HTN and HL: Managed by PCP  Current medicines are reviewed at length with the patient today.  The patient does not have concerns regarding medicines.  The following changes have  been made:  Start nitrogylcerin prn  Labs/ tests ordered today include: ETT  No orders of the defined types were placed in this encounter.     Disposition:   FU with Duke Salvia in 3 months    Signed, Madilyn Hook, MD  09/07/2014 3:31 PM    [facility]

## 2014-09-07 ENCOUNTER — Encounter: Payer: Self-pay | Admitting: Cardiovascular Disease

## 2014-09-07 ENCOUNTER — Ambulatory Visit (INDEPENDENT_AMBULATORY_CARE_PROVIDER_SITE_OTHER): Payer: Medicare HMO | Admitting: Cardiovascular Disease

## 2014-09-07 VITALS — BP 102/76 | HR 79 | Ht 63.0 in | Wt 237.8 lb

## 2014-09-07 DIAGNOSIS — R079 Chest pain, unspecified: Secondary | ICD-10-CM | POA: Diagnosis not present

## 2014-09-07 MED ORDER — NITROGLYCERIN 0.4 MG SL SUBL: 0.4000 mg | SUBLINGUAL_TABLET | SUBLINGUAL | Status: AC | PRN

## 2014-09-07 NOTE — Patient Instructions (Signed)
Your physician has requested that you have en exercise stress myoview. For further information please visit [email or web address] Please follow instruction sheet, as given.  Prescription for NTG tablet sublingual sent to the pharmacy  Your physician wants you to follow-up in 3 months Dr Duke Salvia.  You will receive a reminder letter in the mail two months in advance. If you don't receive a letter, please call our office to schedule the follow-up appointment.

## 2014-09-08 ENCOUNTER — Telehealth (HOSPITAL_COMMUNITY): Payer: Self-pay

## 2014-09-08 NOTE — Telephone Encounter (Signed)
Encounter complete. 

## 2014-09-10 ENCOUNTER — Other Ambulatory Visit: Payer: Medicare HMO

## 2014-09-10 ENCOUNTER — Ambulatory Visit (HOSPITAL_COMMUNITY)
Admission: RE | Admit: 2014-09-10 | Discharge: 2014-09-10 | Disposition: A | Discharge status: Home / Self Care | Payer: Medicare HMO | Source: Ambulatory Visit | Attending: Cardiovascular Disease | Admitting: Cardiovascular Disease

## 2014-09-10 DIAGNOSIS — R5383 Other fatigue: Secondary | ICD-10-CM | POA: Insufficient documentation

## 2014-09-10 DIAGNOSIS — J45909 Unspecified asthma, uncomplicated: Secondary | ICD-10-CM | POA: Insufficient documentation

## 2014-09-10 DIAGNOSIS — R079 Chest pain, unspecified: Secondary | ICD-10-CM

## 2014-09-10 DIAGNOSIS — Z6841 Body Mass Index (BMI) 40.0 and over, adult: Secondary | ICD-10-CM | POA: Diagnosis not present

## 2014-09-10 DIAGNOSIS — I10 Essential (primary) hypertension: Secondary | ICD-10-CM | POA: Insufficient documentation

## 2014-09-10 DIAGNOSIS — G4733 Obstructive sleep apnea (adult) (pediatric): Secondary | ICD-10-CM | POA: Insufficient documentation

## 2014-09-10 DIAGNOSIS — R9439 Abnormal result of other cardiovascular function study: Secondary | ICD-10-CM | POA: Insufficient documentation

## 2014-09-10 DIAGNOSIS — E669 Obesity, unspecified: Secondary | ICD-10-CM | POA: Diagnosis not present

## 2014-09-10 DIAGNOSIS — Z8249 Family history of ischemic heart disease and other diseases of the circulatory system: Secondary | ICD-10-CM | POA: Insufficient documentation

## 2014-09-10 MED ORDER — TECHNETIUM TC 99M SESTAMIBI GENERIC - CARDIOLITE
32.0000 | Freq: Once | INTRAVENOUS | Status: AC | PRN
  Administered 2014-09-10: 32 via INTRAVENOUS

## 2014-09-11 ENCOUNTER — Ambulatory Visit (HOSPITAL_COMMUNITY)
Admission: RE | Admit: 2014-09-11 | Discharge: 2014-09-11 | Disposition: A | Discharge status: Home / Self Care | Payer: Medicare HMO | Source: Ambulatory Visit | Attending: Cardiovascular Disease | Admitting: Cardiovascular Disease

## 2014-09-11 ENCOUNTER — Telehealth: Payer: Self-pay | Admitting: *Deleted

## 2014-09-11 ENCOUNTER — Other Ambulatory Visit: Payer: Self-pay | Admitting: Family Medicine

## 2014-09-11 LAB — MYOCARDIAL PERFUSION IMAGING
CHL CUP MPHR: 154 {beats}/min
CHL CUP RESTING HR STRESS: 87 {beats}/min
CHL RATE OF PERCEIVED EXERTION: 16
CSEPEDS: 30 s
Estimated workload: 6.4 METS
Exercise duration (min): 4 min
LV dias vol: 71 mL
LVSYSVOL: 24 mL
Peak HR: 137 {beats}/min
Percent HR: 88 %
SDS: 7
SRS: 0
SSS: 7
TID: 0.85

## 2014-09-11 MED ORDER — TECHNETIUM TC 99M SESTAMIBI GENERIC - CARDIOLITE
31.5000 | Freq: Once | INTRAVENOUS | Status: AC | PRN
  Administered 2014-09-11: 32 via INTRAVENOUS

## 2014-09-11 NOTE — Telephone Encounter (Signed)
Spoke to patient. Result given . Verbalized understanding  

## 2014-09-11 NOTE — Telephone Encounter (Signed)
-----   Message from Chilton Si, MD sent at 09/11/2014  4:56 PM EDT ----- Stress test and BNP normal.  Shortness of breath likely not coming from her heart.  No need for nitroglycerin.

## 2014-09-15 ENCOUNTER — Encounter: Payer: Self-pay | Admitting: Internal Medicine

## 2014-09-15 ENCOUNTER — Ambulatory Visit (INDEPENDENT_AMBULATORY_CARE_PROVIDER_SITE_OTHER): Payer: Medicare HMO | Admitting: Internal Medicine

## 2014-09-15 VITALS — BP 124/72 | HR 78 | Temp 98.4°F | Wt 238.0 lb

## 2014-09-15 DIAGNOSIS — K625 Hemorrhage of anus and rectum: Secondary | ICD-10-CM | POA: Diagnosis not present

## 2014-09-15 DIAGNOSIS — K5792 Diverticulitis of intestine, part unspecified, without perforation or abscess without bleeding: Secondary | ICD-10-CM | POA: Diagnosis not present

## 2014-09-15 LAB — COMPREHENSIVE METABOLIC PANEL
ALK PHOS: 59 U/L (ref 39–117)
ALT: 14 U/L (ref 0–35)
AST: 16 U/L (ref 0–37)
Albumin: 3.9 g/dL (ref 3.5–5.2)
BUN: 20 mg/dL (ref 6–23)
CHLORIDE: 102 meq/L (ref 96–112)
CO2: 34 mEq/L — ABNORMAL HIGH (ref 19–32)
Calcium: 9.1 mg/dL (ref 8.4–10.5)
Creatinine, Ser: 0.88 mg/dL (ref 0.40–1.20)
GFR: 68.26 mL/min (ref >60.00)
GLUCOSE: 97 mg/dL (ref 70–99)
Potassium: 4.1 mEq/L (ref 3.5–5.1)
Sodium: 141 mEq/L (ref 135–145)
Total Bilirubin: 0.3 mg/dL (ref 0.2–1.2)
Total Protein: 7 g/dL (ref 6.0–8.3)

## 2014-09-15 LAB — CBC
HCT: 39.2 % (ref 36.0–46.0)
Hemoglobin: 13 g/dL (ref 12.0–15.0)
MCHC: 33.2 g/dL (ref 30.0–36.0)
MCV: 83.2 fl (ref 78.0–100.0)
PLATELETS: 123 10*3/uL — AB (ref 150.0–400.0)
RBC: 4.72 Mil/uL (ref 3.87–5.11)
RDW: 14.6 % (ref 11.5–15.5)
WBC: 8.7 10*3/uL (ref 4.0–10.5)

## 2014-09-15 MED ORDER — CIPROFLOXACIN HCL 500 MG PO TABS: 500.0000 mg | ORAL_TABLET | Freq: Two times a day (BID) | ORAL | Status: DC

## 2014-09-15 MED ORDER — METRONIDAZOLE 500 MG PO TABS: 500.0000 mg | ORAL_TABLET | Freq: Three times a day (TID) | ORAL | Status: DC

## 2014-09-15 NOTE — Progress Notes (Signed)
Pre visit review using our clinic review tool, if applicable. No additional management support is needed unless otherwise documented below in the visit note. 

## 2014-09-15 NOTE — Patient Instructions (Signed)

## 2014-09-15 NOTE — Progress Notes (Signed)
Subjective:    Patient ID: Michelle Mcgrath, female    DOB: 11-May-1950, 64 y.o.   MRN: [id number]  HPI  Pt presents to the clinic today with c/o vomiting and diarrhea. She reports this started 3 days ago after eating BBQ and ice cream. She has also noticed some abdominal cramping, bloating and bright red blood from her rectum. She has not had a BM in 2 days but she has been passing gas. She does continue to have BRBPR. She denies loss of appetite but she has been trying to avoid dairy. She does not have hemorrhoids that she is aware of. She had a colonoscopy in 2008 which did show diverticulosis of the sigmoid colon. She did have a flare of diverticulitis in 2009 and was hospitalized for 3 days. She does have a history of GERD and stomach ulcers. She takes Zantac and Prilosec as prescribed.  Review of Systems      Past Medical History  Diagnosis Date  . Fibromyalgia     no mention of this from prior pcp  . Depression   . GERD (gastroesophageal reflux disease)   . History of stomach ulcers   . Allergic rhinitis     environmental  . Hypertension   . HLD (hyperlipidemia)   . History of migraine     none since better BP control  . History of rheumatic fever   . RLS (restless legs syndrome)     on requip  . OSA (obstructive sleep apnea)     sleep study 2013, did not tolerate CPAP. sleep study 2016 CPAP 17 cm H2O with Fisher Paykel simplus medium sized full face mask ([facility])  . Osteoarthritis     s/p knee injections  . PVD (posterior vitreous detachment)   . Chronic lower back pain     MRI with annular tear L4/5 with disc protrusion s/p 1 ESI did not tolerate ([facility])  . History of hydronephrosis     left with ARF, ?due to pelvic organ prolapse,resolved  . Obesity 03/07/2013  . DDD (degenerative disc disease), lumbar   . Diverticulosis 2008    mild by CT 2008    Current Outpatient Prescriptions  Medication Sig Dispense Refill  . acetaminophen (TYLENOL) 500 MG tablet Take 500  mg by mouth every 6 (six) hours as needed for pain.    Marland Kitchen albuterol (PROVENTIL HFA;VENTOLIN HFA) 108 (90 BASE) MCG/ACT inhaler Inhale 2 puffs into the lungs every 6 (six) hours as needed for wheezing or shortness of breath. 1 Inhaler 0  . aspirin 81 MG tablet Take 81 mg by mouth daily.    Marland Kitchen buPROPion (WELLBUTRIN SR) 150 MG 12 hr tablet Take 150 mg by mouth 2 (two) times daily.    . enalapril (VASOTEC) 20 MG tablet TAKE 1 TABLET (20 MG TOTAL) BY MOUTH DAILY. 30 tablet 3  . fluticasone (FLONASE) 50 MCG/ACT nasal spray Place 2 sprays into both nostrils daily. 16 g 3  . hydrochlorothiazide (HYDRODIURIL) 25 MG tablet TAKE 1 TABLET (25 MG TOTAL) BY MOUTH DAILY. 90 tablet 1  . loratadine (CLARITIN) 10 MG tablet Take 10 mg by mouth daily.    Marland Kitchen lovastatin (MEVACOR) 40 MG tablet TAKE 1 TABLET (40 MG TOTAL) BY MOUTH AT BEDTIME. 90 tablet 1  . meloxicam (MOBIC) 7.5 MG tablet Take 7.5 mg by mouth daily.  3  . methocarbamol (ROBAXIN) 500 MG tablet Take 500 mg by mouth 2 (two) times daily.  3  . naproxen (NAPROSYN) 500 MG  tablet Take one po bid x 1 week then prn pain, take with food 60 tablet 0  . nitroGLYCERIN (NITROSTAT) 0.4 MG SL tablet Place 1 tablet (0.4 mg total) under the tongue every 5 (five) minutes as needed for chest pain. 25 tablet 6  . omeprazole (PRILOSEC) 40 MG capsule TAKE 1 CAPSULE BY MOUTH EVERY DAY 90 capsule 1  . ranitidine (ZANTAC) 75 MG tablet Take 75 mg by mouth at bedtime.    Marland Kitchen rOPINIRole (REQUIP) 1 MG tablet TAKE 1 TABLET (1 MG TOTAL) BY MOUTH AT BEDTIME. 30 tablet 3  . sertraline (ZOLOFT) 25 MG tablet TAKE 1 TABLET (25 MG TOTAL) BY MOUTH DAILY. 30 tablet 3   No current facility-administered medications for this visit.    Allergies  Allergen Reactions  . Codeine Other (See Comments)    Headache, nausea  . Gabapentin Other (See Comments)    oversedation  . Lyrica [Pregabalin] Other (See Comments)    Swelling and general fatigue    Family History  Problem Relation Age of Onset    . Hypertension Son   . Arthritis Son   . CAD Brother 55    stents, CABG  . Stroke Neg Hx   . Diabetes Neg Hx   . Cancer Son     bladder (smoker)  . Kidney failure Mother   . Hypertension Brother   . Other Brother     back issues    History   Social History  . Marital Status: Married    Spouse Name: N/A  . Number of Children: N/A  . Years of Education: N/A   Occupational History  . Disability    Social History Main Topics  . Smoking status: Never Smoker   . Smokeless tobacco: Never Used  . Alcohol Use: No  . Drug Use: No  . Sexual Activity: Not on file   Other Topics Concern  . Not on file   Social History Narrative   Lives with husband, 1 dog   Grown children (2 sons, youngest died with bladder cancer)   Occ: disability for FM, lower back pain, depression   Edu: 8th grade   Activity: no regular exercise   Diet:      Constitutional: Denies fever, malaise, fatigue, headache or abrupt weight changes.  Respiratory: Denies difficulty breathing, shortness of breath, cough or sputum production.   Cardiovascular: Denies chest pain, chest tightness, palpitations or swelling in the hands or feet.  Gastrointestinal: Pt reports abdominal pain, bloating and BRBPR.  GU: Denies urgency, frequency, pain with urination, burning sensation, blood in urine, odor or discharge. Neurological: Denies dizziness, difficulty with memory, difficulty with speech or problems with balance and coordination.   No other specific complaints in a complete review of systems (except as listed in HPI above).  Objective:   Physical Exam  BP 124/72 mmHg  Pulse 78  Temp(Src) 98.4 F (36.9 C) (Oral)  Wt 238 lb (107.956 kg)  SpO2 97% Wt Readings from Last 3 Encounters:  09/15/14 238 lb (107.956 kg)  09/10/14 237 lb (107.502 kg)  09/07/14 237 lb 12.8 oz (107.865 kg)    General: Appears her stated age, obese in NAD. Skin: Warm, dry and intact. No rashes, lesions or ulcerations  noted. Cardiovascular: Normal rate and rhythm. S1,S2 noted.  No murmur, rubs or gallops noted.  Pulmonary/Chest: Normal effort and positive vesicular breath sounds. No respiratory distress. No wheezes, rales or ronchi noted.  Abdomen: Soft and generally tender. Normal bowel sounds, no bruits noted.  No distention or masses noted.  Rectal: Normal rectal tone. No external hemorrhoids noted. No internal mass or fissure noted. Stool heme positive. Neurological: Alert and oriented.    BMET    Component Value Date/Time   NA 140 08/26/2014 1627   NA 141 03/19/2011 0420   K 4.0 08/26/2014 1627   K 3.4* 05/26/2014 0908   CL 100 08/26/2014 1627   CL 104 03/19/2011 0420   CO2 33* 08/26/2014 1627   CO2 27 03/19/2011 0420   GLUCOSE 96 08/26/2014 1627   GLUCOSE 108* 03/19/2011 0420   BUN 18 08/26/2014 1627   BUN 13 03/19/2011 0420   CREATININE 0.90 08/26/2014 1627   CREATININE 0.87 03/19/2011 0420   CALCIUM 9.5 08/26/2014 1627   CALCIUM 8.3* 03/19/2011 0420    Lipid Panel     Component Value Date/Time   CHOL 168 10/03/2013 0843   TRIG 119.0 10/03/2013 0843   HDL 37.20* 10/03/2013 0843   CHOLHDL 5 10/03/2013 0843   VLDL 23.8 10/03/2013 0843   LDLCALC 107* 10/03/2013 0843    CBC    Component Value Date/Time   WBC 10.0 08/26/2014 1627   WBC 8.1 05/26/2014 0908   RBC 4.93 08/26/2014 1627   RBC 5.06 05/26/2014 0908   HGB 13.8 08/26/2014 1627   HGB 13.8 05/26/2014 0908   HCT 40.8 08/26/2014 1627   HCT 41.6 05/26/2014 0908   PLT 136.0* 08/26/2014 1627   PLT 150 05/26/2014 0908   MCV 82.7 08/26/2014 1627   MCV 82 05/26/2014 0908   MCH 27.4 05/26/2014 0908   MCHC 33.8 08/26/2014 1627   MCHC 33.3 05/26/2014 0908   RDW 14.8 08/26/2014 1627   RDW 14.1 05/26/2014 0908   LYMPHSABS 1.9 05/26/2014 0908   LYMPHSABS 1.6 10/03/2013 0843   MONOABS 0.6 05/26/2014 0908   MONOABS 0.6 10/03/2013 0843   EOSABS 0.1 05/26/2014 0908   EOSABS 0.1 10/03/2013 0843   BASOSABS 0.0 05/26/2014 0908    BASOSABS 0.0 10/03/2013 0843    Hgb A1C No results found for: HGBA1C      Assessment & Plan:   Diverticulitis, hemorrhagic:  Will treat with Cipro/Flagyl x 10 days CBC and CMET today  Call me in 48 hours if you continue to have BRBPR, will place urgent referral to GI if needed  Will follow up with you after labs, to ER if worse

## 2014-09-28 ENCOUNTER — Ambulatory Visit: Payer: Medicare HMO | Admitting: Family Medicine

## 2014-09-30 ENCOUNTER — Telehealth: Payer: Self-pay | Admitting: Internal Medicine

## 2014-09-30 NOTE — Telephone Encounter (Signed)
Patient received letter from [facility] to call.  Please call patient.

## 2014-10-02 NOTE — Telephone Encounter (Signed)
Please refer to result note the original msg

## 2014-10-02 NOTE — Telephone Encounter (Signed)
Patient called stating still has not received a return call.  Please follow up.

## 2014-10-30 ENCOUNTER — Telehealth: Payer: Self-pay | Admitting: Family Medicine

## 2014-10-30 NOTE — Telephone Encounter (Signed)
It appears she only needs a flu vaccine but I will lett Dr.G nurse make the final say since I am not familiar with you guys schedule of vaccines.

## 2014-10-30 NOTE — Telephone Encounter (Signed)
Pt wants to know if she is due for anything more than a flu vaccine. Let me know and i can schedule, thanks

## 2014-11-03 ENCOUNTER — Encounter: Payer: Self-pay | Admitting: Family Medicine

## 2014-11-03 ENCOUNTER — Ambulatory Visit (INDEPENDENT_AMBULATORY_CARE_PROVIDER_SITE_OTHER): Payer: Medicare HMO | Admitting: Family Medicine

## 2014-11-03 VITALS — BP 132/84 | HR 84 | Temp 98.2°F | Wt 238.8 lb

## 2014-11-03 DIAGNOSIS — Z23 Encounter for immunization: Secondary | ICD-10-CM

## 2014-11-03 DIAGNOSIS — M150 Primary generalized (osteo)arthritis: Secondary | ICD-10-CM

## 2014-11-03 DIAGNOSIS — M797 Fibromyalgia: Secondary | ICD-10-CM | POA: Diagnosis not present

## 2014-11-03 DIAGNOSIS — M159 Polyosteoarthritis, unspecified: Secondary | ICD-10-CM

## 2014-11-03 NOTE — Assessment & Plan Note (Addendum)
Anticipate osteoarthritis related pain that worsens during fall months. Discussed importance of regular activity - pt will increase walking. She will trial vitamin D 2000 and glucosamine for arthralgia relief. She will start meloxicam 7.5mg  daily and tylenol prn.  Recommended avoiding narcotics - pt agrees. Consider trial off statin in the future.

## 2014-11-03 NOTE — Progress Notes (Signed)
BP 132/84 mmHg  Pulse 84  Temp(Src) 98.2 F (36.8 C) (Oral)  Wt 238 lb 12 oz (108.296 kg)   CC: discuss arthralgias  Subjective:    Patient ID: Michelle Mcgrath, female    DOB: 1950-03-18, 64 y.o.   MRN: [id number]  HPI: Michelle Mcgrath is a 64 y.o. female presenting on 11/03/2014 for Arthritis   "Time of the year where I stay sore and stiff". Known fibromyalgia per patient, as well as osteoarthritis and chronic lower back pain. Wants to discuss new meds for arthralgias. She takes tylenol 1000-2000mg  daily but not regularly. She also has been prescribed meloxicam and methocarbamol but doesn't regularly take. Denies redness/warmth or swelling of joints. Occasional hand swelling.   Cymbalta was not effective either.  Does not like narcotics - self tapered off prior hydrocodone.   s/p knee injections and shoulder arthroscopy and repair for L RTC bursal tear.  DDD and annular tear with disc protrusion without relief after 1 ESI.   No regular exercise. Recently moved to new location.   Relevant past medical, surgical, family and social history reviewed and updated as indicated. Interim medical history since our last visit reviewed. Allergies and medications reviewed and updated. Current Outpatient Prescriptions on File Prior to Visit  Medication Sig  . acetaminophen (TYLENOL) 500 MG tablet Take 500 mg by mouth every 6 (six) hours as needed for pain.  Marland Kitchen albuterol (PROVENTIL HFA;VENTOLIN HFA) 108 (90 BASE) MCG/ACT inhaler Inhale 2 puffs into the lungs every 6 (six) hours as needed for wheezing or shortness of breath.  Marland Kitchen aspirin 81 MG tablet Take 81 mg by mouth daily.  Marland Kitchen buPROPion (WELLBUTRIN SR) 150 MG 12 hr tablet Take 150 mg by mouth 2 (two) times daily.  . enalapril (VASOTEC) 20 MG tablet TAKE 1 TABLET (20 MG TOTAL) BY MOUTH DAILY.  . fluticasone (FLONASE) 50 MCG/ACT nasal spray Place 2 sprays into both nostrils daily.  . hydrochlorothiazide (HYDRODIURIL) 25 MG tablet TAKE 1 TABLET (25 MG  TOTAL) BY MOUTH DAILY.  Marland Kitchen loratadine (CLARITIN) 10 MG tablet Take 10 mg by mouth daily.  Marland Kitchen lovastatin (MEVACOR) 40 MG tablet TAKE 1 TABLET (40 MG TOTAL) BY MOUTH AT BEDTIME.  . meloxicam (MOBIC) 7.5 MG tablet Take 7.5 mg by mouth daily.  . methocarbamol (ROBAXIN) 500 MG tablet Take 500 mg by mouth 2 (two) times daily.  . naproxen (NAPROSYN) 500 MG tablet Take one po bid x 1 week then prn pain, take with food  . nitroGLYCERIN (NITROSTAT) 0.4 MG SL tablet Place 1 tablet (0.4 mg total) under the tongue every 5 (five) minutes as needed for chest pain.  Marland Kitchen omeprazole (PRILOSEC) 40 MG capsule TAKE 1 CAPSULE BY MOUTH EVERY DAY  . ranitidine (ZANTAC) 75 MG tablet Take 75 mg by mouth at bedtime.  . sertraline (ZOLOFT) 25 MG tablet TAKE 1 TABLET (25 MG TOTAL) BY MOUTH DAILY.  Marland Kitchen rOPINIRole (REQUIP) 1 MG tablet TAKE 1 TABLET (1 MG TOTAL) BY MOUTH AT BEDTIME. (Patient not taking: Reported on 11/03/2014)   No current facility-administered medications on file prior to visit.    Review of Systems Per HPI unless specifically indicated above     Objective:    BP 132/84 mmHg  Pulse 84  Temp(Src) 98.2 F (36.8 C) (Oral)  Wt 238 lb 12 oz (108.296 kg)  Wt Readings from Last 3 Encounters:  11/03/14 238 lb 12 oz (108.296 kg)  09/15/14 238 lb (107.956 kg)  09/10/14 237 lb (107.502 kg)  Physical Exam  Constitutional: She appears well-developed and well-nourished. No distress.  Musculoskeletal: She exhibits no edema.  FROM at shoulders, knees No active synovitis  Nursing note and vitals reviewed.     Assessment & Plan:   Problem List Items Addressed This Visit    Osteoarthritis - Primary    Anticipate osteoarthritis related pain that worsens during fall months. Discussed importance of regular activity - pt will increase walking. She will trial vitamin D 2000 and glucosamine for arthralgia relief. She will start meloxicam 7.5mg  daily and tylenol prn.  Recommended avoiding narcotics - pt  agrees. Consider trial off statin in the future.      Fibromyalgia    Intolerant to lyrica/gabapentin. cymbalta ineffective. Encouraged increased walking.       Other Visit Diagnoses    Need for influenza vaccination        Relevant Orders    Flu Vaccine QUAD 36+ mos PF IM (Fluarix & Fluzone Quad PF) (Completed)        Follow up plan: Return if symptoms worsen or fail to improve.

## 2014-11-03 NOTE — Progress Notes (Signed)
Pre visit review using our clinic review tool, if applicable. No additional management support is needed unless otherwise documented below in the visit note. 

## 2014-11-03 NOTE — Assessment & Plan Note (Signed)
Intolerant to lyrica/gabapentin. cymbalta ineffective. Encouraged increased walking.

## 2014-11-03 NOTE — Patient Instructions (Addendum)
Start taking meloxicam 7.5mg  daily every day (watch for stomach upset and if that happens, back down).  May continue tylenol 1000-2000mg  daily. Increase walking into your routine - this hsould help lower back pain and fibromyalgia.  Start vitamin D 2000 units daily or glucosamine over the counter supplement for arthritis.  Flu shot today.

## 2014-11-03 NOTE — Telephone Encounter (Signed)
She is coming in today, so Dr. Reece Agar will make that determination.

## 2014-11-09 ENCOUNTER — Other Ambulatory Visit: Payer: Self-pay | Admitting: Family Medicine

## 2014-11-16 ENCOUNTER — Encounter: Payer: Self-pay | Admitting: Family Medicine

## 2014-11-16 ENCOUNTER — Ambulatory Visit (INDEPENDENT_AMBULATORY_CARE_PROVIDER_SITE_OTHER): Payer: Medicare HMO | Admitting: Family Medicine

## 2014-11-16 VITALS — BP 116/82 | HR 96 | Temp 98.9°F | Wt 236.8 lb

## 2014-11-16 DIAGNOSIS — R112 Nausea with vomiting, unspecified: Secondary | ICD-10-CM

## 2014-11-16 DIAGNOSIS — R8290 Unspecified abnormal findings in urine: Secondary | ICD-10-CM | POA: Diagnosis not present

## 2014-11-16 LAB — POCT URINALYSIS DIPSTICK
GLUCOSE UA: NEGATIVE
KETONES UA: NEGATIVE
NITRITE UA: NEGATIVE
PH UA: 6
RBC UA: NEGATIVE
Spec Grav, UA: >=1.03
Urobilinogen, UA: 0.2

## 2014-11-16 MED ORDER — ONDANSETRON HCL 4 MG PO TABS: 4.0000 mg | ORAL_TABLET | Freq: Three times a day (TID) | ORAL | Status: AC | PRN

## 2014-11-16 NOTE — Addendum Note (Signed)
Addended by: Eustaquio Boyden on: 11/16/2014 05:38 PM   Modules accepted: Kipp Brood

## 2014-11-16 NOTE — Addendum Note (Signed)
Addended by: Eustaquio Boyden on: 11/16/2014 04:40 PM   Modules accepted: Kipp Brood

## 2014-11-16 NOTE — Progress Notes (Addendum)
BP 116/82 mmHg  Pulse 96  Temp(Src) 98.9 F (37.2 C) (Oral)  Wt 236 lb 12 oz (107.389 kg)   CC: vomiting/chills  Subjective:    Patient ID: Michelle Mcgrath, female    DOB: December 21, 1950, 64 y.o.   MRN: [id number]  HPI: [name] is a 64 y.o. female presenting on 11/16/2014 for Emesis and Urinary Tract Infection   Last night started vomiting brown liquid, staying nauseated. + chills. Urine has foul odor. Headache this morning.   No diarrhea, abd pain, dysuria, urgency or frequency. No hematuria. No coughing, congestion, ST, ear pain. Baseline nocturia. Last stool this morning - normal.  [place].  No sick contacts that she knows of - but she was exposed to many ppl.  Endorses h/o kidney infection remotely.  Recently started meloxicam for arthritis pains.   Relevant past medical, surgical, family and social history reviewed and updated as indicated. Interim medical history since our last visit reviewed. Allergies and medications reviewed and updated. Current Outpatient Prescriptions on File Prior to Visit  Medication Sig  . acetaminophen (TYLENOL) 500 MG tablet Take 500 mg by mouth every 6 (six) hours as needed for pain.  Marland Kitchen albuterol (PROVENTIL HFA;VENTOLIN HFA) 108 (90 BASE) MCG/ACT inhaler Inhale 2 puffs into the lungs every 6 (six) hours as needed for wheezing or shortness of breath.  Marland Kitchen aspirin 81 MG tablet Take 81 mg by mouth daily.  Marland Kitchen buPROPion (WELLBUTRIN SR) 150 MG 12 hr tablet Take 150 mg by mouth 2 (two) times daily.  . enalapril (VASOTEC) 20 MG tablet TAKE 1 TABLET BY MOUTH DAILY  . fluticasone (FLONASE) 50 MCG/ACT nasal spray Place 2 sprays into both nostrils daily.  . hydrochlorothiazide (HYDRODIURIL) 25 MG tablet TAKE 1 TABLET (25 MG TOTAL) BY MOUTH DAILY.  Marland Kitchen loratadine (CLARITIN) 10 MG tablet Take 10 mg by mouth daily.  Marland Kitchen lovastatin (MEVACOR) 40 MG tablet TAKE 1 TABLET (40 MG TOTAL) BY MOUTH AT BEDTIME.  . meloxicam (MOBIC) 7.5 MG tablet Take 7.5 mg by  mouth daily.  . methocarbamol (ROBAXIN) 500 MG tablet Take 500 mg by mouth 2 (two) times daily.  . nitroGLYCERIN (NITROSTAT) 0.4 MG SL tablet Place 1 tablet (0.4 mg total) under the tongue every 5 (five) minutes as needed for chest pain.  Marland Kitchen omeprazole (PRILOSEC) 40 MG capsule TAKE 1 CAPSULE BY MOUTH EVERY DAY  . ranitidine (ZANTAC) 75 MG tablet Take 75 mg by mouth at bedtime.  Marland Kitchen rOPINIRole (REQUIP) 1 MG tablet TAKE 1 TABLET (1 MG TOTAL) BY MOUTH AT BEDTIME.  Marland Kitchen sertraline (ZOLOFT) 25 MG tablet TAKE 1 TABLET (25 MG TOTAL) BY MOUTH DAILY.   No current facility-administered medications on file prior to visit.    Review of Systems Per HPI unless specifically indicated above     Objective:    BP 116/82 mmHg  Pulse 96  Temp(Src) 98.9 F (37.2 C) (Oral)  Wt 236 lb 12 oz (107.389 kg)  Wt Readings from Last 3 Encounters:  11/16/14 236 lb 12 oz (107.389 kg)  11/03/14 238 lb 12 oz (108.296 kg)  09/15/14 238 lb (107.956 kg)    Physical Exam  Constitutional: She appears well-developed and well-nourished. No distress.  HENT:  Mouth/Throat: Oropharynx is clear and moist. No oropharyngeal exudate.  Eyes: Conjunctivae and EOM are normal. Pupils are equal, round, and reactive to light. No scleral icterus.  Cardiovascular: Normal rate, regular rhythm, normal heart sounds and intact distal pulses.   No murmur heard.  Pulmonary/Chest: Effort normal and breath sounds normal. No respiratory distress. She has no wheezes. She has no rales.  Abdominal: Soft. Bowel sounds are normal. She exhibits no distension and no mass. There is no hepatosplenomegaly. There is tenderness in the left upper quadrant and left lower quadrant. There is CVA tenderness (L sided soreness). There is no rigidity, no rebound, no guarding and negative Murphy's sign.  obese  Musculoskeletal: She exhibits no edema.  Skin: Skin is warm and dry. No rash noted.  Nursing note and vitals reviewed.  Results for orders placed or performed  in visit on 11/16/14  POCT Urinalysis Dipstick  Result Value Ref Range   Color, UA Yellow    Clarity, UA Hazy    Glucose, UA Negative    Bilirubin, UA 1+    Ketones, UA Negative    Spec Grav, UA >=1.030    Blood, UA Negative    pH, UA 6.0    Protein, UA Trace    Urobilinogen, UA 0.2    Nitrite, UA Negative    Leukocytes, UA small (1+) (A) Negative      Assessment & Plan:   Problem List Items Addressed This Visit    Nausea with vomiting - Primary    Pyelo vs colitis vs enteritis - check labs today to rule infection out (CBC, CMP, lipase).  Urine consistent with contamination with large epi. Recollected and sent for culture. Push fluids. zofran for nausea. Update if new or worsening sxs.      Relevant Orders   Comprehensive metabolic panel   CBC with Differential/Platelet   Urine culture   Lipase    Other Visit Diagnoses    Bad odor of urine        Relevant Orders    POCT Urinalysis Dipstick (Completed)    Urine culture        Follow up plan: Return if symptoms worsen or fail to improve.

## 2014-11-16 NOTE — Assessment & Plan Note (Addendum)
Pyelo vs colitis vs enteritis - check labs today to rule infection out (CBC, CMP, lipase).  Urine consistent with contamination with large epi. Recollected and sent for culture. Push fluids. zofran for nausea. Update if new or worsening sxs.

## 2014-11-16 NOTE — Patient Instructions (Addendum)
Let's check labwork today (blood count, kidneys, liver, and pancreas).  Urine sent for culture.  We will call you with results and plan. Push water and plenty of fluids. Nausea medicine sent to pharmacy.

## 2014-11-16 NOTE — Progress Notes (Signed)
Pre visit review using our clinic review tool, if applicable. No additional management support is needed unless otherwise documented below in the visit note. 

## 2014-11-17 ENCOUNTER — Other Ambulatory Visit: Payer: Self-pay | Admitting: Family Medicine

## 2014-11-17 LAB — CBC WITH DIFFERENTIAL/PLATELET
BASOS ABS: 0 10*3/uL (ref 0.0–0.1)
Basophils Relative: 0.3 % (ref 0.0–3.0)
Eosinophils Absolute: 0 10*3/uL (ref 0.0–0.7)
Eosinophils Relative: 0.1 % (ref 0.0–5.0)
HCT: 43 % (ref 36.0–46.0)
Hemoglobin: 14.4 g/dL (ref 12.0–15.0)
LYMPHS ABS: 0.8 10*3/uL (ref 0.7–4.0)
Lymphocytes Relative: 5.5 % — ABNORMAL LOW (ref 12.0–46.0)
MCHC: 33.5 g/dL (ref 30.0–36.0)
MCV: 83.4 fl (ref 78.0–100.0)
MONO ABS: 0.4 10*3/uL (ref 0.1–1.0)
Monocytes Relative: 2.8 % — ABNORMAL LOW (ref 3.0–12.0)
NEUTROS PCT: 91.3 % — AB (ref 43.0–77.0)
Neutro Abs: 12.7 10*3/uL — ABNORMAL HIGH (ref 1.4–7.7)
Platelets: 99 10*3/uL — ABNORMAL LOW (ref 150.0–400.0)
RBC: 5.15 Mil/uL — AB (ref 3.87–5.11)
RDW: 14.9 % (ref 11.5–15.5)
WBC: 13.9 10*3/uL — AB (ref 4.0–10.5)

## 2014-11-17 LAB — COMPREHENSIVE METABOLIC PANEL
ALBUMIN: 4 g/dL (ref 3.5–5.2)
ALK PHOS: 60 U/L (ref 39–117)
ALT: 18 U/L (ref 0–35)
AST: 22 U/L (ref 0–37)
BILIRUBIN TOTAL: 0.4 mg/dL (ref 0.2–1.2)
BUN: 17 mg/dL (ref 6–23)
CO2: 26 mEq/L (ref 19–32)
Calcium: 8.9 mg/dL (ref 8.4–10.5)
Chloride: 103 mEq/L (ref 96–112)
Creatinine, Ser: 1 mg/dL (ref 0.40–1.20)
GFR: 58.86 mL/min — AB (ref >60.00)
GLUCOSE: 102 mg/dL — AB (ref 70–99)
Potassium: 3.7 mEq/L (ref 3.5–5.1)
Sodium: 145 mEq/L (ref 135–145)
TOTAL PROTEIN: 7.4 g/dL (ref 6.0–8.3)

## 2014-11-17 LAB — LIPASE: LIPASE: 18 U/L (ref 11.0–59.0)

## 2014-11-17 LAB — URINE CULTURE

## 2014-11-18 ENCOUNTER — Other Ambulatory Visit: Payer: Self-pay | Admitting: Family Medicine

## 2014-11-18 ENCOUNTER — Telehealth: Payer: Self-pay | Admitting: Family Medicine

## 2014-11-18 ENCOUNTER — Ambulatory Visit
Admission: RE | Admit: 2014-11-18 | Discharge: 2014-11-18 | Disposition: A | Discharge status: Home / Self Care | Payer: Medicare HMO | Source: Ambulatory Visit | Attending: Family Medicine | Admitting: Family Medicine

## 2014-11-18 DIAGNOSIS — Z90710 Acquired absence of both cervix and uterus: Secondary | ICD-10-CM | POA: Diagnosis not present

## 2014-11-18 DIAGNOSIS — R1032 Left lower quadrant pain: Secondary | ICD-10-CM

## 2014-11-18 DIAGNOSIS — R112 Nausea with vomiting, unspecified: Secondary | ICD-10-CM | POA: Insufficient documentation

## 2014-11-18 DIAGNOSIS — K5730 Diverticulosis of large intestine without perforation or abscess without bleeding: Secondary | ICD-10-CM | POA: Diagnosis not present

## 2014-11-18 MED ORDER — AMOXICILLIN-POT CLAVULANATE 875-125 MG PO TABS: 1.0000 | ORAL_TABLET | Freq: Two times a day (BID) | ORAL | Status: DC

## 2014-11-18 MED ORDER — IOHEXOL 300 MG/ML  SOLN
100.0000 mL | Freq: Once | INTRAMUSCULAR | Status: AC | PRN
  Administered 2014-11-18: 100 mL via INTRAVENOUS

## 2014-11-18 NOTE — Telephone Encounter (Addendum)
Pt has appt scheduled with Dr Reece AgarG on 11/19/14 at 9 AM. Pt requested cb with urine results; lab test and urine culture done on 11/16/14 but not released yet.Please advise.

## 2014-11-18 NOTE — Telephone Encounter (Signed)
[facility] - Client TELEPHONE ADVICE RECORD [facility] Patient Name: Michelle CockayneDALE [name] DOB: 06/01/1950 Initial Comment caller states she has pain in left side of her abdomen wrapping around to her back and headache Nurse Assessment Nurse: Lane HackerHarley, RN, Elvin SoWindy Date/Time (Eastern Time): 11/18/2014 10:37:51 AM Confirm and document reason for call. If symptomatic, describe symptoms. ---Caller states she has pain in left mid abdomen wrapping around to her lower back and headache. Pain started last night and cont. thru the night. Pain is gone presently. Denies abdominal or back pain, and denies HA now also. What is causing the pain? Last BM - normal in appearance. - She gave a urine sample on Monday and saw the MD for vomiting Sunday night til Monday AM and she had some soreness on left abdomen. Hasn't heard any results. Has the patient traveled out of the country within the last 30 days? ---No Does the patient have any new or worsening symptoms? ---Yes Will a triage be completed? ---Yes Related visit to physician within the last 2 weeks? ---Yes Does the PT have any chronic conditions? (i.e. diabetes, asthma, etc.) ---Yes List chronic conditions. ---Diverticulitis Guidelines Guideline Title Affirmed Question Affirmed Notes Abdominal Pain - Female [1] MILD (e.g., does not interfere with normal activities) AND [2] pain comes and goes (cramps) AND [3] present > 48 hours --> last felt this AM before getting OOB, rates 5/10 at that time, none now Final Disposition User See Physician within 24 Hours [place], [place], [place] Comments Voicemail would not allow a msg to be left. Pt would like resullts of urine test. Appt made with Dr. Sharen HonesGutierrez for tomorrow AM at 9 am. Referrals REFERRED TO PCP OFFICE Disagree/Comply: Comply

## 2014-11-18 NOTE — Telephone Encounter (Signed)
See results note. 

## 2014-11-18 NOTE — Telephone Encounter (Signed)
Spoke with patient.

## 2014-11-19 ENCOUNTER — Ambulatory Visit: Payer: Self-pay | Admitting: Family Medicine

## 2014-12-03 ENCOUNTER — Other Ambulatory Visit: Payer: Self-pay | Admitting: Family Medicine

## 2014-12-10 ENCOUNTER — Other Ambulatory Visit: Payer: Self-pay | Admitting: Family Medicine

## 2014-12-14 ENCOUNTER — Ambulatory Visit: Payer: Medicare HMO | Admitting: Cardiovascular Disease

## 2014-12-15 ENCOUNTER — Ambulatory Visit (INDEPENDENT_AMBULATORY_CARE_PROVIDER_SITE_OTHER): Payer: Medicare HMO | Admitting: Family Medicine

## 2014-12-15 ENCOUNTER — Encounter: Payer: Self-pay | Admitting: Family Medicine

## 2014-12-15 VITALS — BP 116/70 | HR 85 | Temp 98.1°F | Ht 63.0 in | Wt 237.0 lb

## 2014-12-15 DIAGNOSIS — L239 Allergic contact dermatitis, unspecified cause: Secondary | ICD-10-CM

## 2014-12-15 DIAGNOSIS — L200 Besnier's prurigo: Secondary | ICD-10-CM | POA: Diagnosis not present

## 2014-12-15 MED ORDER — PREDNISONE 10 MG PO TABS: ORAL_TABLET | ORAL | Status: DC

## 2014-12-15 NOTE — Progress Notes (Signed)
Pre visit review using our clinic review tool, if applicable. No additional management support is needed unless otherwise documented below in the visit note. 

## 2014-12-15 NOTE — Assessment & Plan Note (Signed)
Unclear trigger. May be due to shampoo change.  treat with low dose pred taper and antihistamine.

## 2014-12-15 NOTE — Progress Notes (Signed)
   Subjective:    Patient ID: Michelle NashDale H Santone, female    DOB: 01/11/1951, 64 y.o.   MRN: [id number]  HPI 64 year old female pt of Dr. Reece AgarG presents with new onset rash.  She reports new onset itchy lesions/bumps in last few days on scalp. In last 24 hours she has noted pimple , now red lesion on back on neck on left. Lesions are sore now from scratching. No fever, no flu like symptoms.  No other family members with issues.  No animals at home.  No new medicines, no antibiotics.  No new detergents, no new lotions, No new foods. No hair dye, no new conditioner. Occ she changes shampoo, not recently No new plant exposure. Marland Kitchen.  Has not taken any med for it, has put calamine lotion on neck.  Social History /Family History/Past Medical History reviewed and updated if needed. Allergic rhinitis: not using Claritin, using flonase. Review of Systems No mouth swelling, no SOB, no dysphagia.    Objective:   Physical Exam  Constitutional: Vital signs are normal. She appears well-developed and well-nourished. She is cooperative.  Non-toxic appearance. She does not appear ill. No distress.  HENT:  Head: Normocephalic.  Right Ear: Hearing, tympanic membrane, external ear and ear canal normal. Tympanic membrane is not erythematous, not retracted and not bulging.  Left Ear: Hearing, tympanic membrane, external ear and ear canal normal. Tympanic membrane is not erythematous, not retracted and not bulging.  Nose: No mucosal edema or rhinorrhea. Right sinus exhibits no maxillary sinus tenderness and no frontal sinus tenderness. Left sinus exhibits no maxillary sinus tenderness and no frontal sinus tenderness.  Mouth/Throat: Uvula is midline, oropharynx is clear and moist and mucous membranes are normal.  Eyes: Conjunctivae, EOM and lids are normal. Pupils are equal, round, and reactive to light. Lids are everted and swept, no foreign bodies found.  Neck: Trachea normal and normal range of motion. Neck supple.  Carotid bruit is not present. No thyroid mass and no thyromegaly present.  Cardiovascular: Normal rate, regular rhythm, S1 normal, S2 normal, normal heart sounds, intact distal pulses and normal pulses.  Exam reveals no gallop and no friction rub.   No murmur heard. Pulmonary/Chest: Effort normal and breath sounds normal. No tachypnea. No respiratory distress. She has no decreased breath sounds. She has no wheezes. She has no rhonchi. She has no rales.  Abdominal: Soft. Normal appearance and bowel sounds are normal. There is no tenderness.  Neurological: She is alert.  Skin: Skin is warm, dry and intact. Rash noted.  Erythematous whelp on left posterior neck, warm, red, 1 inch by 2 inches oblong  no central lesion, no clear bite marks.  Excoriations, no lesions on scalp.  Psychiatric: Her speech is normal and behavior is normal. Judgment and thought content normal. Her mood appears not anxious. Cognition and memory are normal. She does not exhibit a depressed mood.          Assessment & Plan:

## 2014-12-15 NOTE — Patient Instructions (Signed)
Look into triggers. Start claritin during the day,  Benadryl at bedtime. Start low dose steroid taper. Call if not improving as expected.

## 2015-01-14 ENCOUNTER — Other Ambulatory Visit: Payer: Self-pay | Admitting: *Deleted

## 2015-01-14 MED ORDER — ENALAPRIL MALEATE 20 MG PO TABS: 20.0000 mg | ORAL_TABLET | Freq: Every day | ORAL | Status: DC

## 2015-01-19 ENCOUNTER — Other Ambulatory Visit: Payer: Self-pay | Admitting: Family Medicine

## 2015-01-27 ENCOUNTER — Ambulatory Visit (INDEPENDENT_AMBULATORY_CARE_PROVIDER_SITE_OTHER): Payer: Medicare HMO | Admitting: Family Medicine

## 2015-01-27 ENCOUNTER — Encounter: Payer: Self-pay | Admitting: Family Medicine

## 2015-01-27 VITALS — BP 120/74 | HR 81 | Temp 98.1°F | Wt 239.0 lb

## 2015-01-27 DIAGNOSIS — K6289 Other specified diseases of anus and rectum: Secondary | ICD-10-CM | POA: Diagnosis not present

## 2015-01-27 DIAGNOSIS — R238 Other skin changes: Secondary | ICD-10-CM | POA: Diagnosis not present

## 2015-01-27 MED ORDER — POLYETHYLENE GLYCOL 3350 17 GM/SCOOP PO POWD
17.0000 g | Freq: Every day | ORAL | Status: AC | PRN
Start: 2015-01-27 — End: ?

## 2015-01-27 MED ORDER — HYDROCORTISONE 2.5 % RE CREA: 1.0000 "application " | TOPICAL_CREAM | Freq: Every day | RECTAL | Status: AC

## 2015-01-27 NOTE — Assessment & Plan Note (Signed)
Anticipate from chronic perianal irritation. rec anusol HC steroid course x 1 wk (once daily) and desitin barrier cream to bottom after every bowel movement. Update if not improving with treatment.

## 2015-01-27 NOTE — Assessment & Plan Note (Signed)
External lesion to left nose. Reassured. Rec warm compresses and should improve with time. Not consistent with MRSA infection, no internal lesions. Consider rosacea if persistent.

## 2015-01-27 NOTE — Progress Notes (Signed)
BP 120/74 mmHg  Pulse 81  Temp(Src) 98.1 F (36.7 C) (Oral)  Wt 239 lb (108.41 kg)  SpO2 95%   CC: facial pain, hemorrhoids  Subjective:    Patient ID: Michelle Mcgrath, female    DOB: 07/05/1950, 64 y.o.   MRN: [id number]  HPI: Michelle NashDale H Chartrand is a 64 y.o. female presenting on 01/27/2015 for Hemorrhoids and Facial Pain   Over last 2 weeks noticing burning/itching every time she has a bowel movement. Noticing hemorrhoids with mild bleeding as well. Taking OTC [facility] stool softener as well as preparation H and suppositories for hemorrhoids. Does not use medicated wipes. No blood in commode, scant intermittent blood with wiping  Normally stools 2-3 times a day, small amounts. Soft stools. No diarrhea, nausea/vomiting, abd pain. Passing gas ok.  Facial pain with sinus drainage that started today. Some L external nasal pain as well for the past week. Mild cough with head congestion for last week. No fevers,chills, headache, ear or tooth pain. Has been using alcohol and washing face with noxema.  Colonoscopy UTD per patient.  Relevant past medical, surgical, family and social history reviewed and updated as indicated. Interim medical history since our last visit reviewed. Allergies and medications reviewed and updated. Current Outpatient Prescriptions on File Prior to Visit  Medication Sig  . acetaminophen (TYLENOL) 500 MG tablet Take 500 mg by mouth every 6 (six) hours as needed for pain.  Marland Kitchen. albuterol (PROVENTIL HFA;VENTOLIN HFA) 108 (90 BASE) MCG/ACT inhaler Inhale 2 puffs into the lungs every 6 (six) hours as needed for wheezing or shortness of breath.  Marland Kitchen. aspirin 81 MG tablet Take 81 mg by mouth daily.  Marland Kitchen. buPROPion (WELLBUTRIN SR) 150 MG 12 hr tablet Take 150 mg by mouth 2 (two) times daily.  . enalapril (VASOTEC) 20 MG tablet Take 1 tablet (20 mg total) by mouth daily.  . fluticasone (FLONASE) 50 MCG/ACT nasal spray Place 2 sprays into both nostrils daily.  . hydrochlorothiazide (HYDRODIURIL)  25 MG tablet TAKE 1 TABLET (25 MG TOTAL) BY MOUTH DAILY.  Marland Kitchen. loratadine (CLARITIN) 10 MG tablet Take 10 mg by mouth daily.  Marland Kitchen. lovastatin (MEVACOR) 40 MG tablet TAKE 1 TABLET (40 MG TOTAL) BY MOUTH AT BEDTIME.  . meloxicam (MOBIC) 7.5 MG tablet Take 7.5 mg by mouth daily.  . methocarbamol (ROBAXIN) 500 MG tablet Take 500 mg by mouth 2 (two) times daily.  . nitroGLYCERIN (NITROSTAT) 0.4 MG SL tablet Place 1 tablet (0.4 mg total) under the tongue every 5 (five) minutes as needed for chest pain.  Marland Kitchen. omeprazole (PRILOSEC) 40 MG capsule TAKE 1 CAPSULE BY MOUTH EVERY DAY  . ondansetron (ZOFRAN) 4 MG tablet Take 1 tablet (4 mg total) by mouth every 8 (eight) hours as needed for nausea or vomiting.  . ranitidine (ZANTAC) 75 MG tablet Take 75 mg by mouth at bedtime.  . sertraline (ZOLOFT) 25 MG tablet TAKE 1 TABLET (25 MG TOTAL) BY MOUTH DAILY.   No current facility-administered medications on file prior to visit.    Review of Systems Per HPI unless specifically indicated in ROS section     Objective:    BP 120/74 mmHg  Pulse 81  Temp(Src) 98.1 F (36.7 C) (Oral)  Wt 239 lb (108.41 kg)  SpO2 95%  Wt Readings from Last 3 Encounters:  01/27/15 239 lb (108.41 kg)  12/15/14 237 lb (107.502 kg)  11/16/14 236 lb 12 oz (107.389 kg)    Physical Exam  Constitutional: She appears well-developed  and well-nourished. No distress.  HENT:  Head: Normocephalic and atraumatic.  Nose: No mucosal edema or rhinorrhea. Right sinus exhibits no maxillary sinus tenderness and no frontal sinus tenderness. Left sinus exhibits no maxillary sinus tenderness and no frontal sinus tenderness.    Mouth/Throat: Uvula is midline, oropharynx is clear and moist and mucous membranes are normal. No oropharyngeal exudate, posterior oropharyngeal edema, posterior oropharyngeal erythema or tonsillar abscesses.  Small mildly inflamed papule L anterior external nose No internal lesions  Eyes: Conjunctivae and EOM are normal.  Pupils are equal, round, and reactive to light. No scleral icterus.  Neck: Normal range of motion. Neck supple.  Cardiovascular: Normal rate, regular rhythm, normal heart sounds and intact distal pulses.   No murmur heard. Pulmonary/Chest: Effort normal and breath sounds normal. No respiratory distress. She has no wheezes. She has no rales.  Genitourinary: Rectal exam shows external hemorrhoid (sm non inflamed). Rectal exam shows no internal hemorrhoid, no fissure, no mass, no tenderness and anal tone normal.  Several perianal erosion with raw skin  Lymphadenopathy:    She has no cervical adenopathy.  Skin: Skin is warm and dry. No rash noted.  Nursing note and vitals reviewed.  Chaperoned exam.    Assessment & Plan:   Problem List Items Addressed This Visit    Papule of skin    External lesion to left nose. Reassured. Rec warm compresses and should improve with time. Not consistent with MRSA infection, no internal lesions. Consider rosacea if persistent.      Erosion of perianal region - Primary    Anticipate from chronic perianal irritation. rec anusol HC steroid course x 1 wk (once daily) and desitin barrier cream to bottom after every bowel movement. Update if not improving with treatment.          Follow up plan: Return if symptoms worsen or fail to improve.

## 2015-01-27 NOTE — Patient Instructions (Signed)
For spot on nose - use warm compresses. Should improve over time. For erosions around bottom - likely raw skin from irritation after bowel movements Start using desitin barrier protective cream after bowel movements. May use anusol HC cream once daily for 1 week then stop. Stop laxative. Start miralax 1 capful daily for constipation, hold for diarrhea. Let us know if no improvement with above treatment.

## 2015-06-27 ENCOUNTER — Other Ambulatory Visit: Payer: Self-pay | Admitting: Family Medicine

## 2015-07-07 ENCOUNTER — Telehealth: Payer: Self-pay

## 2015-07-07 NOTE — Telephone Encounter (Signed)
Patient is on the list for [employer] 2017 and may be a good candidate for an AWV in 2017.  

## 2015-07-13 NOTE — Telephone Encounter (Signed)
Left voicemail with contact info regarding scheduling AWV and CPE.

## 2015-09-23 ENCOUNTER — Other Ambulatory Visit: Payer: Self-pay | Admitting: Family Medicine

## 2015-10-21 ENCOUNTER — Other Ambulatory Visit: Payer: Self-pay | Admitting: Family Medicine

## 2015-12-05 ENCOUNTER — Other Ambulatory Visit: Payer: Self-pay | Admitting: Family Medicine

## 2016-01-01 ENCOUNTER — Other Ambulatory Visit: Payer: Self-pay | Admitting: Family Medicine

## 2016-01-03 ENCOUNTER — Other Ambulatory Visit: Payer: Self-pay | Admitting: Family Medicine

## 2016-01-03 NOTE — Telephone Encounter (Signed)
[facility] on Sertraline 25mg  Last filled - 01/19/15, #30x11 Last AEX - 01/27/15, Acute only [pt has only been seen for acutes] Next AEX - No future appointments scheduled Please Advise on refills/SLS 11/27

## 2016-01-03 NOTE — Telephone Encounter (Signed)
[facility]ribe request from [facility] for [facility]ill on Enalapril 20 mg Last filled - 12/06/15, #30x0 with Note that Patient Must Have CPE prior to future refills Last AEX - 01/27/15, Actues Only, OVs have been canceled in Appt History Next AEX - No future appt scheduled Please Advise on refills/SLS 11/27

## 2016-02-02 ENCOUNTER — Other Ambulatory Visit: Payer: Self-pay | Admitting: Family Medicine

## 2016-05-23 ENCOUNTER — Other Ambulatory Visit: Payer: Self-pay | Admitting: Family Medicine

## 2016-10-23 ENCOUNTER — Ambulatory Visit
Admission: RE | Admit: 2016-10-23 | Discharge: 2016-10-23 | Disposition: A | Discharge status: Home / Self Care | Payer: MEDICARE | Attending: Internal Medicine | Admitting: Internal Medicine

## 2016-10-23 ENCOUNTER — Ambulatory Visit
Admission: RE | Admit: 2016-10-23 | Discharge: 2016-10-23 | Disposition: A | Discharge status: Home / Self Care | Payer: MEDICARE

## 2016-10-23 DIAGNOSIS — D469 Myelodysplastic syndrome, unspecified: Principal | ICD-10-CM

## 2016-10-23 DIAGNOSIS — R0609 Other forms of dyspnea: Secondary | ICD-10-CM

## 2016-10-23 DIAGNOSIS — D471 Chronic myeloproliferative disease: Principal | ICD-10-CM

## 2016-10-26 ENCOUNTER — Ambulatory Visit: Admit: 2016-10-26 | Discharge: 2016-10-26 | Payer: MEDICARE | Attending: Internal Medicine

## 2016-10-26 DIAGNOSIS — D469 Myelodysplastic syndrome, unspecified: Principal | ICD-10-CM

## 2016-11-30 IMAGING — CR DG CHEST 2V
2 series · 2 of 2 positions shown · non-contrast
Comparison: None.

CLINICAL DATA: Left chest wall pain and shortness of breath. Left
shoulder pain.

EXAM:
CHEST  2 VIEW

[view not recorded (1 of 2)]
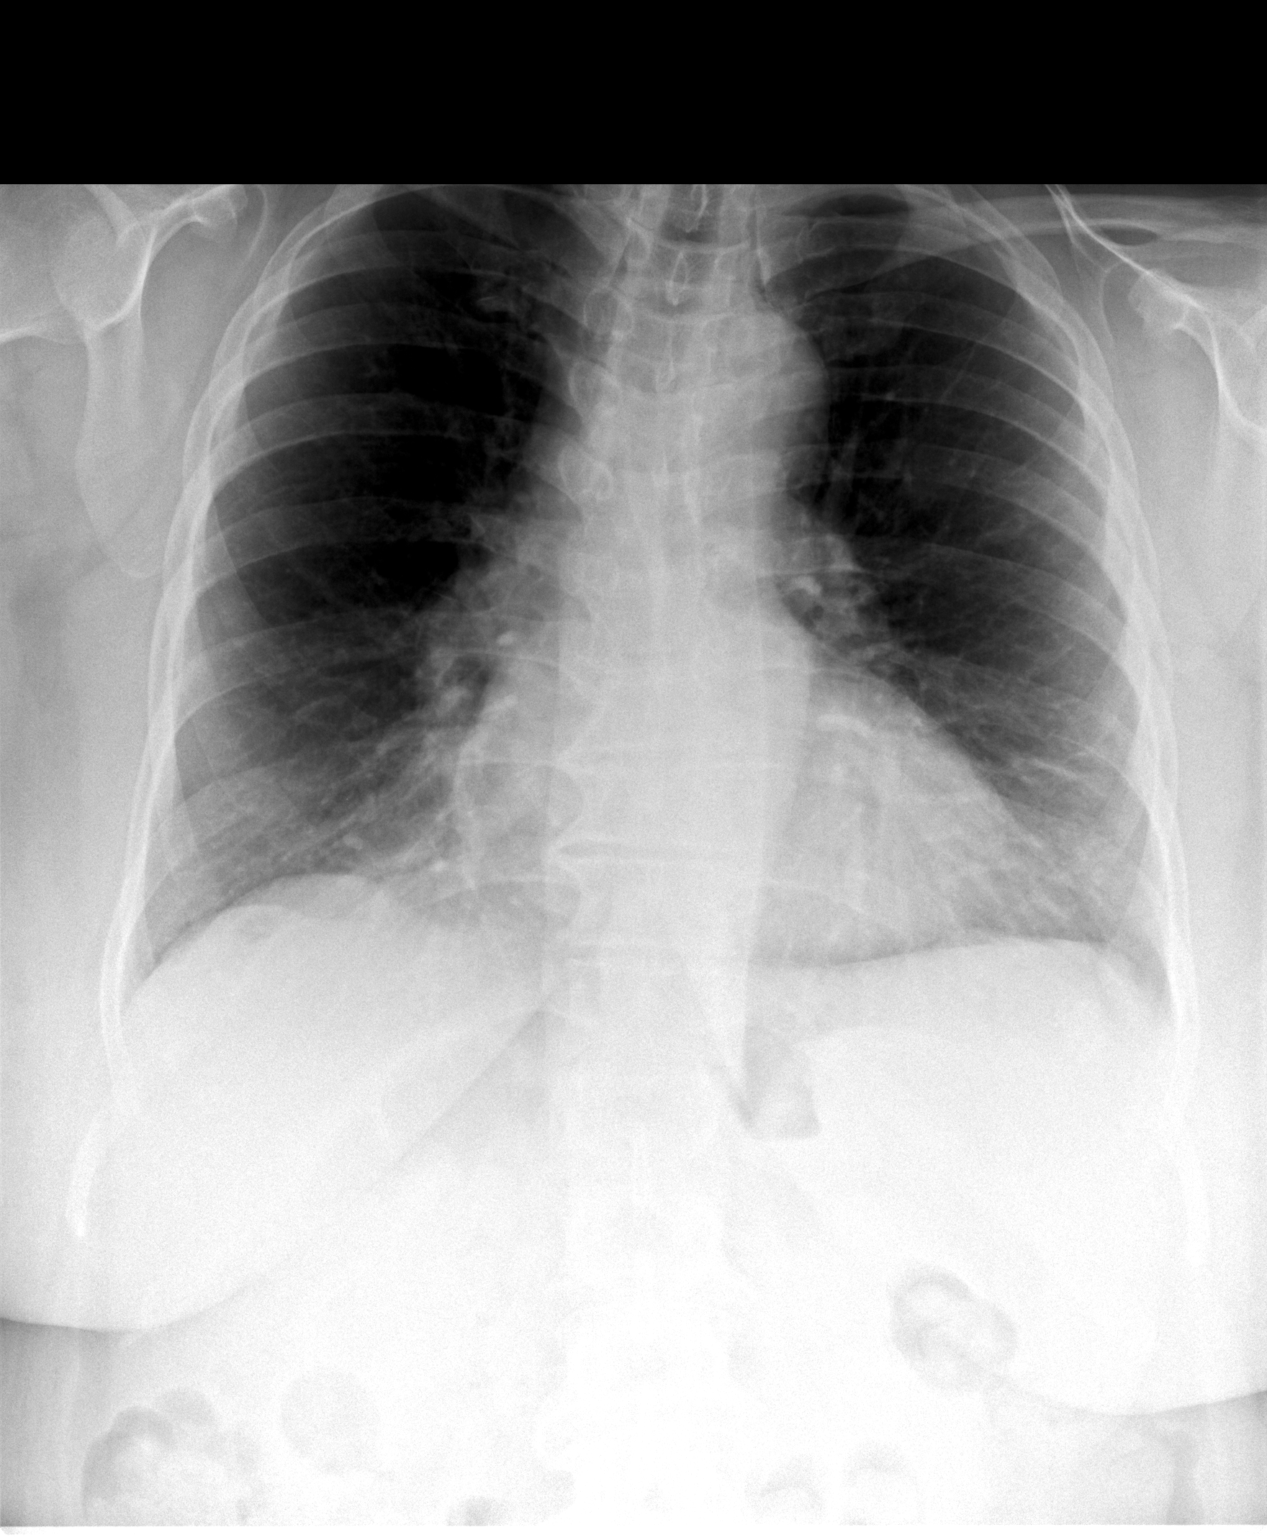

[view not recorded (2 of 2)]
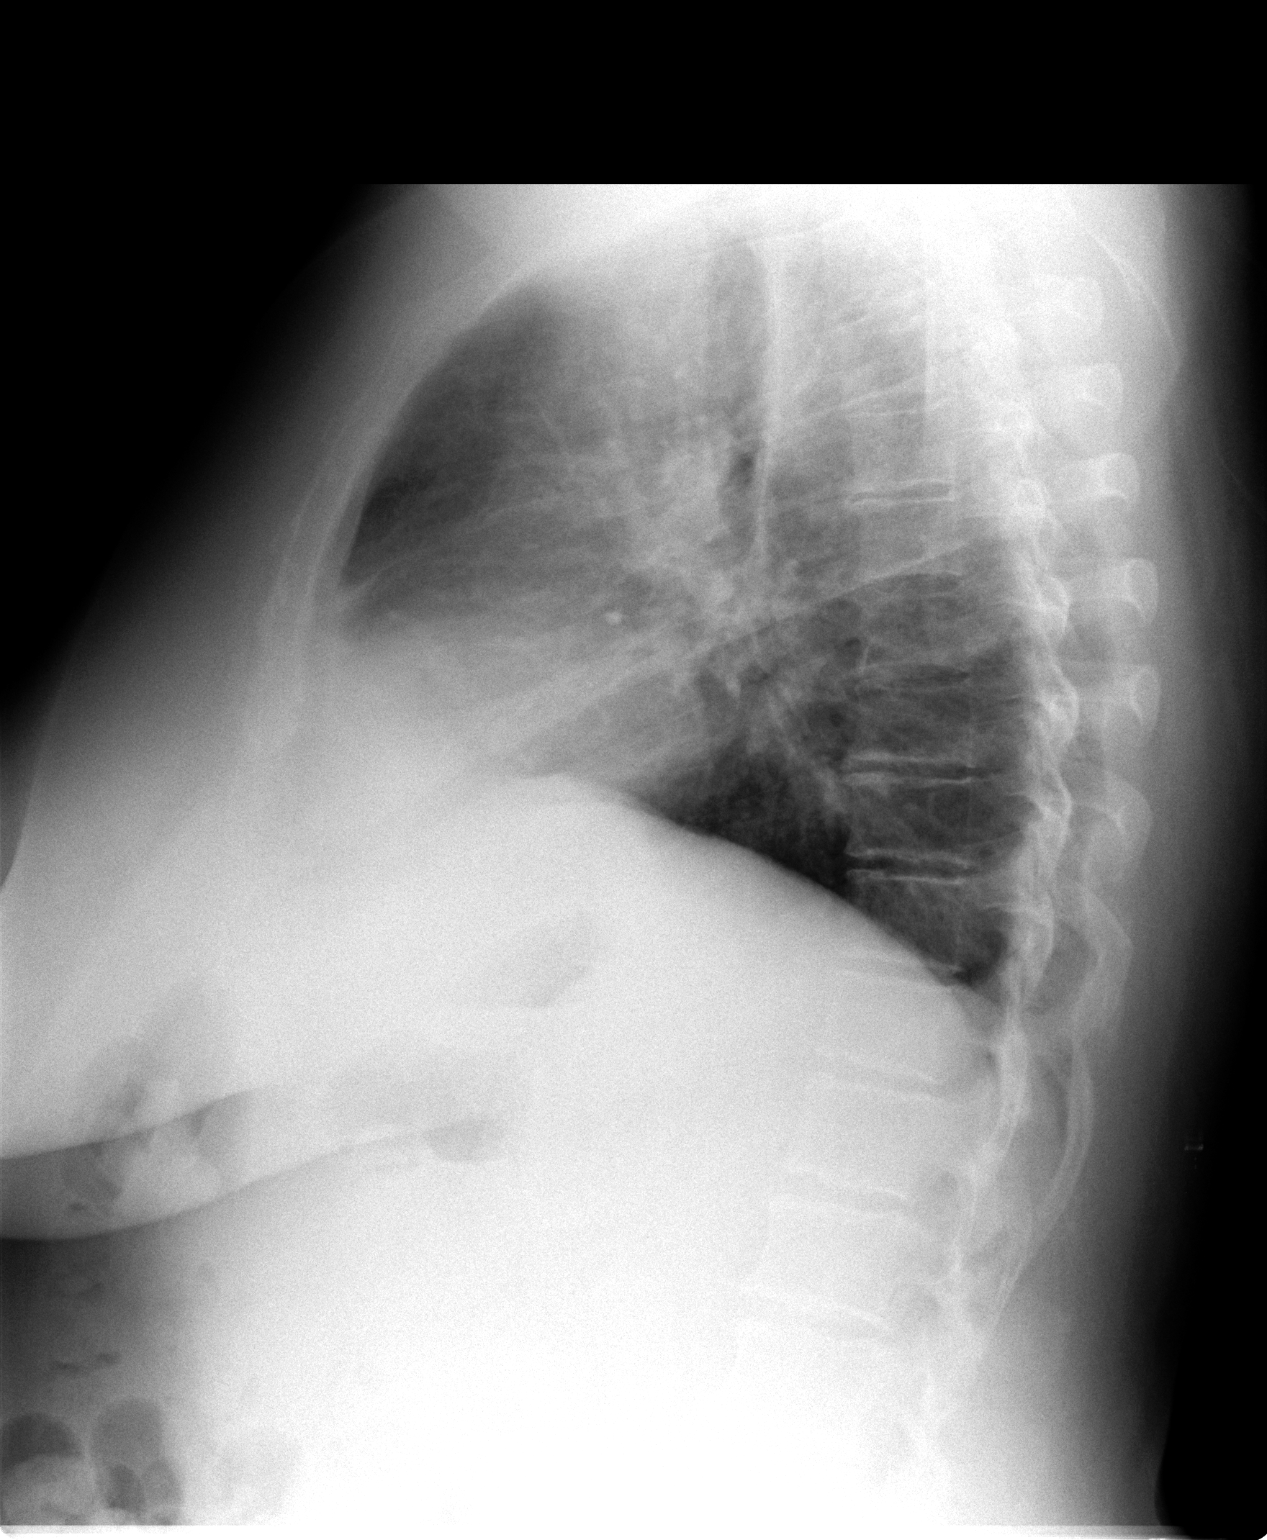

[2 of 2 positions shown; findings below may reference images not displayed]

FINDINGS: Lungs are adequately inflated without consolidation or effusion.
Cardiomediastinal silhouette is within normal. There are mild
degenerative changes of the spine.
IMPRESSION: No active cardiopulmonary disease.

## 2016-12-04 ENCOUNTER — Ambulatory Visit
Admission: RE | Admit: 2016-12-04 | Discharge: 2016-12-04 | Disposition: A | Discharge status: Home / Self Care | Payer: MEDICARE | Attending: Adult Health | Admitting: Adult Health

## 2016-12-04 ENCOUNTER — Ambulatory Visit
Admission: RE | Admit: 2016-12-04 | Discharge: 2016-12-04 | Disposition: A | Discharge status: Home / Self Care | Payer: MEDICARE

## 2016-12-04 DIAGNOSIS — D471 Chronic myeloproliferative disease: Principal | ICD-10-CM

## 2016-12-04 DIAGNOSIS — R0609 Other forms of dyspnea: Secondary | ICD-10-CM

## 2016-12-04 DIAGNOSIS — D696 Thrombocytopenia, unspecified: Secondary | ICD-10-CM

## 2016-12-04 DIAGNOSIS — Z79899 Other long term (current) drug therapy: Secondary | ICD-10-CM

## 2016-12-04 MED ORDER — RUXOLITINIB 5 MG TABLET
ORAL_TABLET | Freq: Two times a day (BID) | ORAL | 0 refills | 0 days | Status: CP
Start: 2016-12-04 — End: 2017-01-03

## 2016-12-04 NOTE — Unmapped (Signed)
[facility]opl[facility] Follow up Visit    Patient: Emily Walter  MRN: [id number]  DOB: Mar 06, 1950  Date of Visit: 12/04/2016    Referred By: Lamont Snowball, PAC  [address]  [facility]  [place], [place] [address]    Reason for Visit   Emily Walter is a 66 y.o. here in follow-up for newly diagnosed primary myelofibrosis.     Assessment   #1 Primary myelofibrosis, intermediate-2 by DIPSS-Plus, High-risk by MIPSS70-Plus  #2 Thrombocytopenia - thought secondary to #1  #3 Dyspnea on exertion - of unclear etiology at this time    Bone marrow biopsy done at reviewed at [facility] on 10/26/2016 showed, Hypercellular bone marrow (80-90%) with disorganized trilineage hematopoiesis and increased fibrosis (less than 5% blasts by manual aspirate differential and immunohistochemical assessment)  We reviewed together that there is not a conclusive MDS, and it currently fits a myelofibrosis picture.  We also reviewed that the mutation that would be considered high risk in the setting of either MDS or MF.    She has no overt s/s of either MDS or MF, though continues to have some DOE.  Labs are stable.    Please see Dr. Anise Salvo' note for full plan and rationale.  Will plan to start Ruxolitinib after counseling and additional testing (PPD, hep B and C).    Plan   1. Plan ruxolitinib 5mg  PO BID  2. CBC prior to start and every 2-4 weeks  3. PPD, Hep B and C antibody  4. PFTs  5. TTE  6. Return in 2 months to start rux    Markus Jarvis, AGPCNP-C, MSN, OCN  Nurse Practitioner  Hematologic Malignancies  [facility]  [phone] (phone)  [phone] (fax)  [email or web address]      History of Present Illness     Prior labs show:  2014 at [facility]: normal CBC, WBC 6.5, Hgb 15.2, Plts 181.    CBC from 08/03/2016: WBC 10.3 x 10^9/L, Hgb 12.3 g/dL, MCV 85, plt 52 x 84^1/L.    Due to the cytopenias, Emily Walter was sent to Dr. Maryan Char and underwent a bone marrow biopsy. I currently do not have bone marrow slides to review.  Outside [facility] report from 08/03/2016 states,  Hypercellular bone marrow (85%) with hyperplastic hematopoiesis.  Atypical megakaryocyte hyperplasia with focal clustering.  Abnomral localization of immature myeloid precursors.  No significant increase in blasts.  Mild to moderate reticulin fibrosis.    Interval History:  Doing fair today.  Had bronchitis last month, was given a zpak and this resolved..  Reports she was sitting on the toilet once last month and ended up passing out and hitting her head on the tub in front of her.  Went to the ER following this.  Head CT was negative for a bleed.  Continues to have pain from her fibromyalgia.  Takes Tylenol arthritis for this.  Continues on baby aspirin.    Continues to have fatigue and having to stop walking.  No weight loss.  No early satiety, has strong appetite.  Otherwise, denies new bone pain, fevers, chills, night sweats, lumps/bumps, tongue swelling, shortness of breath, syncope, lightheadedness, constipation or diarrhea, nausea or vomiting, very easy bruising or bleeding, or urinary changes.     Review of Systems      10 systems reviewed and negative except as noted in the History of Present Illness.    Medications     Current  Outpatient Prescriptions   Medication Sig Dispense Refill   ??? aspirin (ASPIRIN LOW-STRENGTH) 81 MG chewable tablet Chew 81 mg. Frequency:QD   Dosage:81   MG  Instructions:  Note:Dose: 81 MG     ??? buPROPion (WELLBUTRIN SR) 150 MG 12 hr tablet Take 1 tablet (150 mg total) by mouth Two (2) times a day. Frequency:BID   Dosage:150   MG  Instructions:  Note:Dose: 150MG  (Patient not taking: Reported on 10/23/2016) 180 tablet 4   ??? calcium carbonate-vitamin D2 500 mg(1,250mg ) -200 unit tablet Take 1 tablet by mouth Two (2) times a day.     ??? clonazePAM (KLONOPIN) 0.5 MG tablet Take 0.5 mg by mouth two (2) times a day as needed.     ??? docusate sodium (COLACE) 100 MG capsule Take 100 mg by mouth Two (2) times a day.     ??? enalapril (VASOTEC) 20 MG tablet Take 1 tablet (20 mg total) by mouth daily. Frequency:QD   Dosage:20   MG  Instructions:  Note:Dose: 20MG  (Patient taking differently: Take 20 mg by mouth daily. 5 mg daily) 90 tablet 4   ??? fluticasone (FLONASE) 50 mcg/actuation nasal spray 2 sprays by Each Nare route daily. Frequency:PHARMDIR   Dosage:0.0     Instructions:  Note:1 spray in each nostril, twice a day. Dose: 1 SPRAY     ??? furosemide (LASIX) 40 MG tablet Take 40 mg by mouth Two (2) times a day.     ??? gabapentin (NEURONTIN) 300 MG capsule Take 1po AM and noon and 2po QHS x 3 days, then increase to 2 po AM, 1 po noon, 2 po QHS x 3 days, then 2 poTID (Patient not taking: Reported on 10/23/2016) 180 capsule 0   ??? hydrochlorothiazide (HYDRODIURIL) 25 MG tablet Take 1 tablet (25 mg total) by mouth daily. (Patient not taking: Reported on 10/23/2016) 90 tablet 4   ??? loratadine (CLARITIN) 10 mg tablet Take 10 mg by mouth.     ??? lovastatin (MEVACOR) 40 MG tablet Take 1 tablet (40 mg total) by mouth daily. Frequency:QD   Dosage:40   MG  Instructions:  Note:Dose: 40MG  90 tablet 4   ??? MEDICAL SUPPLY ITEM 1 each by Mouth route daily.  Note:Bilevel PAP 15/11 cm H2O, with a backup rate of 10, heated humidity for`E1o3L` nasal dryness, Mask:  ResMed Ultra Mirage Full Face Mask size medium or small.  mask of choice. Dose: 15/11 CM H2O,     ??? miscellaneous medical supply Misc 2 each. Frequency:PHARMDIR   Dosage:0.0     Instructions:  Note:bilateral compression hose, right knee high and left thigh high, 20-57mm pressure, open or closed toe Dose: 1     ??? omeprazole (PRILOSEC) 40 MG capsule TAKE ONE CAPSULE BY MOUTH EVERY DAY 90 capsule 4   ??? potassium chloride (KLOR-CON) 10 MEQ CR tablet Take 10 mEq by mouth daily.     ??? sertraline (ZOLOFT) 25 MG tablet Take 25 mg by mouth daily.     ??? traMADol (ULTRAM) 50 mg tablet TAKE 1 TABLET (50 MG TOTAL) BY MOUTH EVERY EIGHT (8) HOURS AS NEEDED FOR PAIN. FOR UP TO 10 DAYS (Patient not taking: Reported on 10/23/2016) 30 tablet 0     No current facility-administered medications for this visit.          Allergies     Allergies   Allergen Reactions   ??? Aspirin Other (See Comments)     GETS SICK AT STOMACH & SOMETIMES HAS TO GO TO  ER   ??? Codeine Other (See Comments)     Headache, nausea   ??? Gabapentin Other (See Comments)     oversedation       Past Medical and Surgical History     Past Medical History:   Diagnosis Date   ??? Anxiety    ??? Arthritis    ??? Cataract    ??? Depression    ??? High cholesterol    ??? Hypertension    ??? Migraine headache    ??? Stomach ulcer      Past Surgical History:   Procedure Laterality Date   ??? SALPINGOOPHORECTOMY Right     12/07/2010   ??? VAGINAL HYSTERECTOMY      12/07/2010       Social History     Social History     Social History   ??? Marital status: Married     Spouse name: N/A   ??? Number of children: N/A   ??? Years of education: N/A     Occupational History   ??? Not on file.     Social History Main Topics   ??? Smoking status: Never Smoker   ??? Smokeless tobacco: Never Used   ??? Alcohol use No   ??? Drug use: No   ??? Sexual activity: Not on file     Other Topics Concern   ??? Not on file     Social History Narrative   ??? 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  Exposed to 2nd hand smoke; she is a lifelong non-smoker.  Went on disability in Aug 16, 2004 d/t back and has worsened this year.      Enjoys traveling.  Married - husband is widowed.      Family History   Two sons - the youngest died in 17-Aug-2011 at the age of 17 from bladder cancer.     Physical Examination     There were no vitals filed for this visit.  GENERAL:  Well-appearing and in no apparent distress.  HEENT:  Anicteric sclera.  No oropharyngeal ulceration nor erythema.  NECK:  No thyromegaly nor masses  LYMPH:  No cervical, supraclavicular, axillary, epitrochlear adenopathy  CV:  RRR.  S1, S2.  No m/r/g.  LUNGS: Good air movement.  Diffuse expiratory wheezing.    ABDOMEN:  Soft, nt/nd. No hepatosplenomegaly.  EXTREMITIES:  No clubbing, cyanosis, nor edema.  NEURO:  AO X 4. CN II-XII grossly intact and symmetric.  Gait is examined and normal.  SKIN: No petechiae nor purpura.  No rashes.     Laboratory Testing and Imaging     Results for orders placed or performed in visit on 12/04/16   Comprehensive Metabolic Panel   Result Value Ref Range    Sodium 141 135 - 145 mmol/L    Potassium 4.1 3.5 - 5.0 mmol/L    Chloride 99 98 - 107 mmol/L    CO2 33.0 (H) 22.0 - 30.0 mmol/L    BUN 20 7 - 21 mg/dL    Creatinine 1.61 0.96 - 1.00 mg/dL    BUN/Creatinine Ratio 22     EGFR MDRD Non Af Amer >=60 >=60 mL/min/1.68m2    EGFR MDRD Af Amer >=60 >=60 mL/min/1.32m2    Anion Gap 9 9 - 15 mmol/L    Glucose 99 65 - 179 mg/dL    Calcium 8.7 8.5 - 04.5 mg/dL    Albumin 4.3 3.5 - 5.0 g/dL    Total  Protein 7.8 6.5 - 8.3 g/dL    Total Bilirubin 0.6 0.0 - 1.2 mg/dL    AST 23 14 - 38 U/L    ALT 28 15 - 48 U/L    Alkaline Phosphatase 79 38 - 126 U/L   CBC w/ Differential   Result Value Ref Range    WBC 10.6 4.5 - 11.0 10*9/L    RBC 4.57 4.00 - 5.20 10*12/L    HGB 12.9 12.0 - 16.0 g/dL    HCT 16.1 09.6 - 04.5 %    MCV 85.6 80.0 - 100.0 fL    MCH 28.2 26.0 - 34.0 pg    MCHC 32.9 31.0 - 37.0 g/dL    RDW 40.9 (H) 81.1 - 15.0 %    MPV 10.2 (H) 7.0 - 10.0 fL    Platelet 68 (L) 150 - 440 10*9/L    Variable HGB Concentration Slight (A) Not Present    Absolute Neutrophils 8.2 (H) 2.0 - 7.5 10*9/L    Absolute Lymphocytes 1.8 1.5 - 5.0 10*9/L    Absolute Monocytes 0.5 0.2 - 0.8 10*9/L    Absolute Eosinophils 0.0 0.0 - 0.4 10*9/L    Absolute Basophils 0.0 0.0 - 0.1 10*9/L    Large Unstained Cells 1 0 - 4 %    Microcytosis Slight (A) Not Present    Anisocytosis Slight (A) Not Present    Hypochromasia Moderate (A) Not Present

## 2016-12-04 NOTE — Unmapped (Signed)
I saw and evaluated the patient, participating in the key portions of the service.  It was necessary for me to see the patient in addition to Ms. 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      The patient's myelofibrosis has a variable prognosis dependent upon the risk stratification schema used:  IPSS: 1 point = intermediate-1 risk      By DIPSS-Plus:  Intermediate-2 risk, corresponding to a median overall survival of 2.9 years.             By MIPPS70-Plus:  High-risk, given the SRSF2 and the high-risk cytogenetics. Median overall survival 3.9 years in the [facility] cohort (much longer in the [place] - unclear why the discrepancy).            We discussed the following general treatment strategy in myelofibrosis:      Regarding the possibility of an allogeneic stem cell transplant, the patient is presently a marginal candidate due to her shortness of breath - needs a TTE and PFTs.  This is presently largely unexplained and would be unusual to represent extramedullary disease at this time given that she does not have much splenomegaly.  She has 3 brothers - doesn't talk to youngest brother.  Middle brother has significant health problems. Other brother is ~60 yo.  Has one son, but wouldn't want to ask him.  So, would be looking at an unrelated donor.  Will refer to BMT so that patient may better understand the process, noting, however, that her organ function will need further optimized before she is an ideal candidate.      As well, I briefly discussed ruxolitinib today.  She still needs PPD, hepatitis B and C antibody testing as well as counseling.  A script was sent for 5mg  po BID to [facility] to determine what the patient's cost will be.  She IS NOT TO START the medication until the additional testing is complete - she verbalized understanding.  Monitoring parameters for ruxolitinib: CBC (baseline, every 2 to 4 weeks until dose stabilized, then as clinically indicated), lipid parameters (8 to 12 weeks after ruxolitinib initiation and as appropriate thereafter), renal function, hepatic function. Monitor hepatitis B viral load (HBV-DNA titer) in patients with chronic hepatitis B infection. Perform periodic skin examinations monitor for signs/symptoms of infection; Tuberculin skin test (prior to initiation).     Given the overall stability, will plan to see the patient back in 2 months to begin ruxolitinib.  In the meantime, will order a TTE, PFTs and PPD.    Rozetta Nunnery, MD  Hematology/Oncology

## 2016-12-07 NOTE — Unmapped (Signed)
-----   Message from Yolanda Manges. sent at 12/07/2016 12:39 PM EDT -----  Regarding: prescription issue  Contact: [phone]  Patient was calling about medication issue, patient stated that the pharmacy told her that her doctor would need to run some more tests before they will dispense it. Pharmacy is the [facility] shared services pharmacy.    Medication : JAKAFI 5 mg    Thanks in advance,  Yolanda Manges.  [facility]  [phone]

## 2016-12-07 NOTE — Unmapped (Addendum)
**  Update**  Pt's MD office clarified they do not want this drug to go to patient until they have taught/counseled them in their office.  Called pt and informed them we will not be sending med until either they or the MD office contacts [place] to do so.        [facility]   Patient Onboarding/Medication Counseling    [name] is a 66 y.o. female with myelofibrosis who I am counseling today on initiation of therapy.    Medication: Jakafi 5mg     Verified patient's date of birth / HIPAA.      Education Provided: ??    Dose/Administration discussed: 1 tablet by mouth 2 times daily. This medication should be taken  without regard to food.     Storage requirements: this medicine should be stored at room temperature.     Side effects discussed: Discussed common side effects, including, but not limited to, loose stools, constipation, headache/dizziness, upset stomach. If patient experiences allergic reaction (hives/itching/wheezing/sob/swelling of face or mouth), signs of infection (high fever, uti symptoms, etc.), signs of bleeding (red blood/dark urine/stools, etc.), painful rash, etc., they need to call the doctor.  Patient will receive a Lexi-Comp drug information handout with shipment.    Handling precautions reviewed:  Patient was counseled on the hazards surrounding oral chemotherapy and will minimize contact/exposure to the medicine.    Drug Interactions: other medications reviewed and up to date in Epic.  No drug interactions identified, but counseled patients to avoid grapefruit and grapefruit juice.    Comorbidities/Allergies: reviewed and up to date in Epic.    Verified therapy is appropriate and should continue      Delivery Information    Anticipated copay of $8.35 reviewed with patient. Verified delivery address in [facility] and reviewed medication storage requirement.    Scheduled delivery date: Note on Rx hardcopy to only send after teaching... Contacting MD/clinic to determine if they want the patient to have drug to bring to clinic for teaching or not to send/fill the drug at all.  Informed pt of this and told them we would call them if we would not be sending the med after speaking with MD.  Told patient if MD said ok to send they would get it on 12/13/16.  Waiting to hear back from MD at this point.    Explained that we ship using UPS and this shipment will not require a signature.      Explained the services we provide at [facility] and that each month we would call to set up refills.  Stressed importance of returning phone calls so that we could ensure they receive their medications in time each month.  Informed patient that we should be setting up refills 7-10 days prior to when they will run out of medication.  Informed patient that welcome packet will be sent.      Patient verbalized understanding of the above information as well as how to contact the pharmacy at [phone] option 4 with any questions/concerns.        Patient Specific Needs      ? Patient has no physical or cognitive barriers.    ? Patient prefers to have medications discussed with  Patient     ? Patient is able to read and understand education materials at a high school level or above.        Karene Fry Stefannie Defeo  [facility] Specialty Pharmacist

## 2016-12-07 NOTE — Unmapped (Signed)
Returned call. Reviewed with patient plan to: Jakafi pills (ruxolitinib) - DO NOT START. Need a little more testing and counseling before starting - just seeing how much it will cost for you.    Reviewed upcoming appointments.

## 2016-12-22 ENCOUNTER — Ambulatory Visit
Admission: RE | Admit: 2016-12-22 | Discharge: 2016-12-22 | Disposition: A | Discharge status: Home / Self Care | Payer: MEDICARE

## 2016-12-22 DIAGNOSIS — D471 Chronic myeloproliferative disease: Principal | ICD-10-CM

## 2016-12-22 DIAGNOSIS — D7581 Myelofibrosis: Secondary | ICD-10-CM

## 2016-12-22 DIAGNOSIS — R0609 Other forms of dyspnea: Principal | ICD-10-CM

## 2016-12-22 NOTE — Unmapped (Signed)
BMT Initial Patient Consultation    Patient Name: Emily Walter  Encounter Date: December 22, 2016 2:09 PM    Referring Provider: Dr. Porfirio Oar    Primary Care Provider: Silvestre Gunner, [facility]    Reason for Consultation    Consideration of  allogeneic hematopoietic cell transplantation for a diagnosis of DIPSS-Plus Int-2 myelofibrosis.    History of Present Illness:    Emily Walter is a 66 y.o. female who I am asked to see by Dr. Porfirio Oar in consultation for consideration of  allogeniec hematopoietic cell transplantation for a diagnosis of myelofibrosis.  The following history was obtained from a combination of patient interview and medical records review.    In brief, [name] has a history of depression, fibromyalgia, arthritis, bursitis, and obesity, which has led to relative deconditioning and disability since she left her employment in 2006. Despite this, she had been in her usual and reasonable state of health until the last year, when she experienced progressive and insidious functional decline and thrombocytopenia. She was ultimately seen by Dr. Kathrynn Running and Dr. Anise Salvo and underwent additional evaluation, including bone marrow biopsy, and was ultimately assigned a diagnosis of primary myelofibrosis. 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 By the IPSS, she was found to have intermediate-1 risk, and by the DIPSS-plus, she had intermediate-2 risk disease. This latter classification is associated with a median overall survival of approximately 3 years. ??Because this level of risk is conventionally used to help determine disease-based appropriateness for consideration of transplantation, she was referred to see me in the BMT clinic.    From a disease standpoint, management with ruxolitinib is being considered but not has yet been started. She is not requiring regular transfusions. She does have progressive deterioriation in her exercise capacity, and can walk less than 100 yards before she becomes fatigued, with sore throat and dyspnea. This is a significant change from her usual baseline. She has been gaining rather than losing weight, though she does admit to continued peripheral lower extremity edema, and now nausea as well as early satiety. She does not have splenic pain or fullness.     Review of Systems:  A comprehensive review of 14 systems was negative except for pertinent positives noted in HPI.     No history exists.       Patient Active Problem List    Diagnosis Date Noted   ??? MPN (myeloproliferative neoplasm) (CMS-HCC) 12/04/2016   ??? S/P vaginal hysterectomy 04/28/2014   ??? History of right salpingo-oophorectomy 04/28/2014   ??? Restless leg syndrome 07/30/2012   ??? Depression 07/30/2012   ??? Other postprocedural status(V45.89) 08/16/2011   ??? Constipation 09/01/2010   ??? Mixed urge and stress incontinence 09/01/2010   ??? Sleep apnea 04/08/2008   ??? Hydronephrosis 05/08/2006   ??? Reflux esophagitis 01/09/2006   ??? Hypercholesteremia 10/18/2005   ??? Hypertension, benign 04/12/2005   ??? Backache 12/28/2000       Past Medical History:   Diagnosis Date   ??? Anxiety    ??? Arthritis    ??? Cataract    ??? Depression    ??? High cholesterol    ??? Hypertension    ??? Migraine headache    ??? Stomach ulcer        Family History   Problem Relation Age of Onset   ??? Stroke Mother        Social History   Substance Use Topics   ???  Smoking status: Never Smoker   ??? Smokeless tobacco: Never Used   ??? Alcohol use No       Current Outpatient Prescriptions   Medication Sig Dispense Refill   ??? aspirin (ASPIRIN LOW-STRENGTH) 81 MG chewable tablet Chew 81 mg. Frequency:QD   Dosage:81   MG  Instructions:  Note:Dose: 81 MG     ??? calcium carbonate-vitamin D2 500 mg(1,250mg ) -200 unit tablet Take 1 tablet by mouth Two (2) times a day.     ??? docusate sodium (COLACE) 100 MG capsule Take 100 mg by mouth Two (2) times a day.     ??? enalapril (VASOTEC) 20 MG tablet Take 1 tablet (20 mg total) by mouth daily. Frequency:QD   Dosage:20   MG  Instructions:  Note:Dose: 20MG  (Patient taking differently: Take 20 mg by mouth daily. 5 mg daily) 90 tablet 4   ??? fluticasone (FLONASE) 50 mcg/actuation nasal spray 2 sprays by Each Nare route daily. Frequency:PHARMDIR   Dosage:0.0     Instructions:  Note:1 spray in each nostril, twice a day. Dose: 1 SPRAY     ??? furosemide (LASIX) 40 MG tablet Take 40 mg by mouth Two (2) times a day.     ??? loratadine (CLARITIN) 10 mg tablet Take 10 mg by mouth.     ??? lovastatin (MEVACOR) 40 MG tablet Take 1 tablet (40 mg total) by mouth daily. Frequency:QD   Dosage:40   MG  Instructions:  Note:Dose: 40MG  90 tablet 4   ??? omeprazole (PRILOSEC) 40 MG capsule TAKE ONE CAPSULE BY MOUTH EVERY DAY 90 capsule 4   ??? potassium chloride (KLOR-CON) 10 MEQ CR tablet Take 10 mEq by mouth daily.     ??? sertraline (ZOLOFT) 25 MG tablet Take 25 mg by mouth daily.     ??? MEDICAL SUPPLY ITEM 1 each by Mouth route daily.  Note:Bilevel PAP 15/11 cm H2O, with a backup rate of 10, heated humidity for`E1o3L` nasal dryness, Mask:  ResMed Ultra Mirage Full Face Mask size medium or small.  mask of choice. Dose: 15/11 CM H2O,     ??? ruxolitinib (JAKAFI) 5 mg tablet Take 1 tablet (5 mg total) by mouth Two (2) times a day. DO NOT FILL. Test claim.  Needs teaching prior to starting. (Patient not taking: Reported on 12/22/2016) 60 tablet 0     No current facility-administered medications for this visit.        Allergies   Allergen Reactions   ??? Aspirin Other (See Comments)     GETS SICK AT STOMACH & SOMETIMES HAS TO GO TO ER   ??? Codeine Other (See Comments)     Headache, nausea   ??? Gabapentin Other (See Comments)     oversedation       Physical Exam:    Vital signs:  BP 146/68 (BP Site: L Arm, BP Position: Sitting)  - Pulse 90  - Temp 36.9 ??C (98.4 ??F) (Oral)  - Resp 18  - Ht 160 cm (5' 2.99)  - Wt (!) 115.3 kg (254 lb 3.2 oz)  - SpO2 96%  - BMI 45.04 kg/m??   General appearance: alert  Eyes: PERRL, EOMI Throat: lips, mucosa, and tongue normal; teeth and gums normal  Lungs: clear to auscultation bilaterally  Heart: regular rate and rhythm, S1, S2 normal, no murmur, click, rub or gallop  Abdomen: spleen palpable 3cm below LCM  Extremities: 2+ bilateral lower extremity edema   Pulses: 2+ and symmetric.  Skin: Skin color, texture, turgor normal.  No rashes or lesions  Lymph nodes: Cervical, supraclavicular, and axillary nodes normal.  Neurologic: Grossly normal    Karnofsky/Lansky Performance Status  70, Cares for self; unable to carry on normal activity or to do active work (ECOG equivalent 1)      Test Results:    Recent Results (from the past 24 hour(s))   Spirometry    Collection Time: 12/22/16  1:41 PM   Result Value Ref Range    FEV1 PRE 1.28 (L) 1.61096 - 2.80027 L    FEV1/FVC PRE 68.98 66.565 - 86.153 %    FVC PRE 1.86 (L) 2.27192 - 3.61182 L    PEF PRE 4.11 3.98096 - 7.29616 L/s    Vol extrap pre 0.03 L    FIVC PRE 0.01 (L) 2.27192 - 3.61182 L    FEV6 PRE 1.73 (L) 2.15832 - 3.47314 L    FEV1/FEV6 PRE 73.96 70.6786 - 88.2786 %    FEF50% PRE 1.23 L/s    FEF25-75% PRE 0.71 (L) 0.73679 - 3.13295 L/s    EAVWUJ81-19 PRE 0.71 L/s    FET100% Change 10.14 sec    DLCO PRE 14.44 14.782956213086 - 57.846962952841324 ml/(min*mmHg)    BHT POST 11.22 sec    DLCO/VA POST 5.1 4.01027253664 - 4.03474259563 ml/(min*mmHg*L)    VA PRE 2.83 (L) 8.75643 - 3.29518 L    IVC PRE 1.42 (L) 2.27192 - 3.61182 L    DL Adj PRE 84.16 ml/(min*mmHg)    DL/VA Adj PRE 5.1 ml/(min*mmHg*L)   Diffusion Studies    Collection Time: 12/22/16  1:41 PM   Result Value Ref Range    FEV1 PRE 1.28 (L) 6.06301 - 2.80027 L    FEV1/FVC PRE 68.98 66.565 - 86.153 %    FVC PRE 1.86 (L) 2.27192 - 3.61182 L    PEF PRE 4.11 3.98096 - 7.29616 L/s    Vol extrap pre 0.03 L    FIVC PRE 0.01 (L) 2.27192 - 3.61182 L    FEV6 PRE 1.73 (L) 2.15832 - 3.47314 L    FEV1/FEV6 PRE 73.96 70.6786 - 88.2786 %    FEF50% PRE 1.23 L/s    FEF25-75% PRE 0.71 (L) 0.73679 - 3.13295 L/s SWFUXN23-55 PRE 0.71 L/s    FET100% Change 10.14 sec    DLCO PRE 14.44 73.220254270623 - 76.283151761607371 ml/(min*mmHg)    BHT POST 11.22 sec    DLCO/VA POST 5.1 0.62694854627 - 0.35009381829 ml/(min*mmHg*L)    VA PRE 2.83 (L) 9.37169 - 6.78938 L    IVC PRE 1.42 (L) 2.27192 - 3.61182 L    DL Adj PRE 10.17 ml/(min*mmHg)    DL/VA Adj PRE 5.1 ml/(min*mmHg*L)       Assessment/Plan:    In summary, this is a 66 year old female with DIPSS plus int-2 primary myelofibrosis who comes to the BMT clinic to discuss the role of transplantation in her disease management. PMF is considered to be an incurable disease with conventional management. Survival can be prolonged, depending on the level of baseline risk, though we have typically considered patients with at least DIPSS int-2 disease to be appropriate candidates for consideration, based on expected tradeoffs of risk and potential for long term survival. In highly selected candidates, alloHCT is associated with up to a 40% cure rate in patients with PMF, though at the risk of short term toxicity and the possibility of GVHD, functional impairment, and potential TRM. For various reasons, the population utilization rate of transplant in this disease is likely around 10%. Thus, patient selection is  a key consideration.    When considering allogeneic stem cell transplantation, we typically assess issues related to psychosocial and caregiver concerns, comorbidities, and functional status.     Regarding psychosocial concerns, [name] does not have a significant history of tobacco, alcohol or other substance use.  She does  have a reliable caregiver identified, her husband.     Regarding comorbid illness, [name]' history of depression would be a risk factor for adverse transplant outcomes, though this would not in and of itself preclude transplant. From a functional status standpoint, her KPS is about 67 and she would not be an appropriate candidate in her current state even for an RIC transplant. I would look for symptomatic improvement from ruxolitinib as a pre-requisite to proceeding with transplantation.     Today, the process involved in allogeneic stem cell transplantation was discussed with [name]. This process includes the following elements: confirmation of adequate disease control, donor search and identification, pre-transplant organ function and serological testing, and preparation for a transplant admission date.     The transplant admission itself typically involves a 2-4  week hospitalization, the first week of which includes conditioning chemotherapy. For this patient, I would likely  recommend a reduced intensity regimen that includes busulfan and fludarabine. I would recommend GVHD prophylaxis with tacrolimus and methotrexate    I discussed expected short and long term toxicities of the conditioning regimen with  [name]. These may include, but may not be limited to, fatigue, nausea, vomiting, anorexia, mucositis, diarrhea, neutropenic fevers, and temporary transfusion dependence. Prior to discharge, we typically require that our patients achieve greater than two liters of oral intake, are without fevers, have demonstrated adequate self-care skills, and are not requiring frequent transfusions.     Following discharge, we require that patients come to clinic two to three times weekly for at least the first three months following transplant. Patients must have a committed caregiver during this period. Additional complications that may arise following discharge can include acute or chronic graft vs host disease, opportunistic infection, and organ toxicity. Taken together, transplant-related mortality estimates typically range from 10-30% in the first two years following allogeneic stem cell transplantation, depending on the underlying disease and patient characteristics. Some patients also experience long term disability and/or health related quality of life impairments that are related to GVHD or other complications. The risks of all of these potential outcomes were discussed with  [name] today, who now has an understanding of the risks involved.    Today, following her visit with me, [name] met further with a transplant nurse coordinator, Meredeth Ide, for additional patient education.. Following her visit with me and with Ms. Tye, [name] did not think that she wished to consider transplant further, as she was concerned about the burden and risks of the procedure. She understands that her current treatment will not lead to cure of her disease, but she find this acceptable when considering the tradeoffs that she perceives she would need to accept if she were to proceed with transplant.    Moving forward, I would be pleased to see Ms. Resnick back in the BMT clinic at any time that she or Dr. Anise Salvo would like for her to talk with me further about the procedure and to reconsider transplantation at that time. For now, [name] wishes to proceed with myelofibrosis disease management and not to consider transplant further.    Aris Everts, MD, MPH

## 2016-12-22 NOTE — Unmapped (Signed)
Patient  seen for initial allogeneic BMT consultation on 12/22/16 for Myelofibrosis.  Patient arrived with supportive spouse. Provided  BMT Patient Handbook,  National Marrow [employer]or Program website information and coordinator contact information for Christena Flake, who is out today Meredeth Ide met w/pt on her behalf.)    Provided pre-transplant teaching regarding purpose of a BMT, HLA typing, donor search, timing as conveyed by Dr.Wood, hospitalization requirements during transplant, post-transplant expectations including 24/7 caregiver and the need to remain in the [facility] area for at least the first 100 days as well as long-term follow-up requirements.  Patient verbalized an understanding of teaching but states she doesn't feel transplant is something she's willing to do. She feels overwhelmed and would rather try Jakafi. Offered our education book to review but she declined it stating it wouldn't change her position. She did accept Dr. Lucretia Roers and Colleen's business card and I encouraged her to please contact us should she change her mind. She agreed she would do so.    Time spent with patient: .

## 2017-02-22 NOTE — Unmapped (Signed)
Patient never initiated med - rx was sent for benefits analysis to find out if drug was an option... unenrolling from specialty pharmacy outreach

## 2017-03-09 ENCOUNTER — Ambulatory Visit: Admit: 2017-03-09 | Discharge: 2017-03-09 | Payer: MEDICARE | Attending: Oncology | Primary: Oncology

## 2017-03-09 ENCOUNTER — Ambulatory Visit: Admit: 2017-03-09 | Discharge: 2017-03-09 | Payer: MEDICARE | Attending: Adult Health | Primary: Adult Health

## 2017-03-09 DIAGNOSIS — D471 Chronic myeloproliferative disease: Secondary | ICD-10-CM

## 2017-03-09 DIAGNOSIS — D7581 Myelofibrosis: Principal | ICD-10-CM

## 2017-03-09 DIAGNOSIS — Z1159 Encounter for screening for other viral diseases: Secondary | ICD-10-CM

## 2017-03-27 ENCOUNTER — Ambulatory Visit: Admit: 2017-03-27 | Discharge: 2017-03-28 | Payer: MEDICARE

## 2017-03-27 DIAGNOSIS — R0609 Other forms of dyspnea: Principal | ICD-10-CM

## 2017-04-06 ENCOUNTER — Ambulatory Visit: Admit: 2017-04-06 | Discharge: 2017-04-06 | Payer: MEDICARE | Attending: Adult Health | Primary: Adult Health

## 2017-04-06 ENCOUNTER — Other Ambulatory Visit: Admit: 2017-04-06 | Discharge: 2017-04-06 | Payer: MEDICARE

## 2017-04-06 DIAGNOSIS — D471 Chronic myeloproliferative disease: Principal | ICD-10-CM

## 2017-04-06 DIAGNOSIS — D7581 Myelofibrosis: Principal | ICD-10-CM

## 2017-04-06 DIAGNOSIS — E7800 Pure hypercholesterolemia, unspecified: Secondary | ICD-10-CM

## 2017-04-06 NOTE — Unmapped (Signed)
[facility] Follow up Visit    Patient: Emily Walter  MRN: [id number]  DOB: [birth year]  Date of Visit: 04/06/2017    Referred By: Lamont Snowball, PAC  [address]  [address]  [facility]  [place], [place] [address]    Reason for Visit   Emily Walter is a 68 y.o. here in follow-up for newly diagnosed primary myelofibrosis.     Assessment   #1 Primary myelofibrosis, intermediate-2 by DIPSS-Plus, High-risk by MIPSS70-Plus  #2 Thrombocytopenia - thought secondary to #1  #3 Dyspnea on exertion - of unclear etiology at this time      Emily Walter is doing fair.  She continues to have DOE and feels generally poor.   Her labs are stable, with normal hbg/hct, but platelets 53k today.  She did not start the [facility] after I spoke to her about her TB test being negative.  [facility] had closed her case and had no reached out.    We again reviewed the use of ruxolitinib and how it would benefit her.  She verbalizes understanding.    Echo was negative for etiology of SOB.  Suspect it could be r/t her PMF.  It's possible that starting the [facility] may help this.  No other constitutional symptoms.  She does have her chronic fibromyalgia pain.     Plan   1. Start ruxolitinib 5mg  PO BID  2. Lab check locally in 4 weeks   3. Return in 2 months    Markus Jarvis, AGPCNP-C, MSN, OCN  Nurse Practitioner  Hematologic Malignancies  [facility]  [phone] (phone)  [phone] (fax)  [email or web address]      History of Present Illness     Prior labs show:  2014 at [facility]: normal CBC, WBC 6.5, Hgb 15.2, Plts 181.    CBC from 08/03/2016: WBC 10.3 x 10^9/L, Hgb 12.3 g/dL, MCV 85, plt 52 x 84^1/L.    Due to the cytopenias, [place] Walter was sent to Dr. Maryan Char and underwent a bone marrow biopsy. I currently do not have bone marrow slides to review.  Outside [facility] report from 08/03/2016 states,  Hypercellular bone marrow (85%) with hyperplastic hematopoiesis.  Atypical megakaryocyte hyperplasia with focal clustering.  Abnomral localization of immature myeloid precursors.  No significant increase in blasts.  Mild to moderate reticulin fibrosis.    Interval History:  Doing fair today.  She is having some pain in her legs and they're hurting every day, as well being on fire, likely from her fibromyalgia.  She has not started her ruxolitinib yet.  She did fall while she was in [place] and had a bruise.  SOB is stable.  Takes Tylenol arthritis for this.  Continues on baby aspirin.    No weight loss.  No early satiety, has strong appetite.  Otherwise, denies new bone pain, fevers, chills, night sweats, lumps/bumps, tongue swelling, shortness of breath, syncope, lightheadedness, constipation or diarrhea, nausea or vomiting, very easy bruising or bleeding, or urinary changes.     Review of Systems      10 systems reviewed and negative except as noted in the History of Present Illness.    Medications     Current Outpatient Prescriptions   Medication Sig Dispense Refill   ??? aspirin (ASPIRIN LOW-STRENGTH) 81 MG chewable tablet Chew 81 mg. Frequency:QD   Dosage:81   MG  Instructions:  Note:Dose: 81 MG     ??? calcium carbonate-vitamin D2 500  mg(1,250mg ) -200 unit tablet Take 1 tablet by mouth Two (2) times a day.     ??? docusate sodium (COLACE) 100 MG capsule Take 100 mg by mouth Two (2) times a day.     ??? enalapril (VASOTEC) 20 MG tablet Take 1 tablet (20 mg total) by mouth daily. Frequency:QD   Dosage:20   MG  Instructions:  Note:Dose: 20MG  (Patient taking differently: Take 20 mg by mouth daily. 5 mg daily) 90 tablet 4   ??? fluticasone (FLONASE) 50 mcg/actuation nasal spray 2 sprays by Each Nare route daily. Frequency:PHARMDIR   Dosage:0.0     Instructions:  Note:1 spray in each nostril, twice a day. Dose: 1 SPRAY     ??? furosemide (LASIX) 20 MG tablet TAKE 1 TABLET BY MOUTH EVERY DAY FOR BLOOD PRESSURE AND SWELLING  1   ??? furosemide (LASIX) 40 MG tablet Take 40 mg by mouth daily.      ??? loratadine (CLARITIN) 10 mg tablet Take 10 mg by mouth.     ??? lovastatin (MEVACOR) 40 MG tablet Take 1 tablet (40 mg total) by mouth daily. Frequency:QD   Dosage:40   MG  Instructions:  Note:Dose: 40MG  90 tablet 4   ??? MEDICAL SUPPLY ITEM 1 each by Mouth route daily.  Note:Bilevel PAP 15/11 cm H2O, with a backup rate of 10, heated humidity for`E1o3L` nasal dryness, Mask:  ResMed Ultra Mirage Full Face Mask size medium or small.  mask of choice. Dose: 15/11 CM H2O,     ??? naproxen (NAPROSYN) 375 MG tablet TAKE 1 TABLET BY MOUTH TWICE A DAY WITH FOOD  0   ??? omega-3/dha/epa/fish oil (FISH OIL-OMEGA-3 FATTY ACIDS) 300-1,000 mg capsule Take 2 g by mouth daily.     ??? omeprazole (PRILOSEC) 40 MG capsule TAKE ONE CAPSULE BY MOUTH EVERY DAY 90 capsule 4   ??? polyethylene glycol (GLYCOLAX) 17 gram/dose powder Take 17 g by mouth.     ??? potassium chloride (KLOR-CON) 10 MEQ CR tablet Take 10 mEq by mouth daily.     ??? sertraline (ZOLOFT) 25 MG tablet Take 25 mg by mouth daily.     ??? VENTOLIN HFA 90 mcg/actuation inhaler USE 2 PUFFS 4 TIMES A DAY AS NEEDED FOR SHORTNESS OF BREATH/WHEEZING  0   ??? enalapril (VASOTEC) 5 MG tablet        No current facility-administered medications for this visit.          Allergies     Allergies   Allergen Reactions   ??? Aspirin Other (See Comments)     GETS SICK AT STOMACH & SOMETIMES HAS TO GO TO ER   ??? Codeine Other (See Comments)     Headache, nausea   ??? Codeine Sulfate    ??? Gabapentin Other (See Comments)     oversedation       Past Medical and Surgical History     Past Medical History:   Diagnosis Date   ??? Anxiety    ??? Arthritis    ??? Cataract    ??? Depression    ??? High cholesterol    ??? Hypertension    ??? Migraine headache    ??? Stomach ulcer      Past Surgical History:   Procedure Laterality Date   ??? SALPINGOOPHORECTOMY Right     12/07/2010   ??? VAGINAL HYSTERECTOMY      12/07/2010       Social History     Social History     Social History   ???  Marital status: Married     Spouse name: N/A   ??? Number of children: N/A   ??? Years of education: N/A     Occupational History   ??? Not on file.     Social History Main Topics   ??? Smoking status: Never Smoker   ??? Smokeless tobacco: Never Used   ??? Alcohol use No   ??? Drug use: No   ??? Sexual activity: Not on file     Other Topics Concern   ??? Not on file     Social History Narrative   ??? 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  Exposed to 2nd hand smoke; she is a lifelong non-smoker.  Went on disability in 08/23/2004 d/t back and has worsened this year.      Enjoys traveling.  Married - husband is widowed.      Family History   Two sons - the youngest died in 2011/08/24 at the age of 38 from bladder cancer.     Physical Examination     Vitals:    04/06/17 0935 04/06/17 0936   BP:  161/70   Pulse:  71   Resp:  18   Temp:  36.5 ??C (97.7 ??F)   TempSrc:  Oral   SpO2:  99%   Weight: (!) 121.6 kg (268 lb 1.6 oz)      GENERAL:  Well-appearing and in no apparent distress.  HEENT:  Anicteric sclera.  No oropharyngeal ulceration nor erythema.  NECK:  No thyromegaly nor masses  LYMPH:  No cervical, supraclavicular, axillary, epitrochlear adenopathy  CV:  RRR.  S1, S2.  No m/r/g.  LUNGS: Good air movement.  Diffuse expiratory wheezing.    ABDOMEN:  Soft, nt/nd.  No hepatosplenomegaly.  EXTREMITIES:  No clubbing, cyanosis, nor edema.  NEURO:  AO X 4. CN II-XII grossly intact and symmetric.  Gait is examined and normal.  SKIN: No petechiae nor purpura.  No rashes.     Laboratory Testing and Imaging     Results for orders placed or performed in visit on 04/06/17   Comprehensive Metabolic Panel   Result Value Ref Range    Sodium 143 135 - 145 mmol/L    Potassium 4.6 3.5 - 5.0 mmol/L    Chloride 104 98 - 107 mmol/L    CO2 29.0 22.0 - 30.0 mmol/L    BUN 21 7 - 21 mg/dL    Creatinine 9.32 3.55 - 1.00 mg/dL    BUN/Creatinine Ratio 24     EGFR MDRD Non Af Amer >=60 >=60 mL/min/1.50m2    EGFR MDRD Af Amer >=60 >=60 mL/min/1.59m2    Anion Gap 10 9 - 15 mmol/L    Glucose 101 65 - 179 mg/dL    Calcium 8.9 8.5 - 73.2 mg/dL    Albumin 4.1 3.5 - 5.0 g/dL    Total Protein 6.9 6.5 - 8.3 g/dL    Total Bilirubin 0.5 0.0 - 1.2 mg/dL    AST 23 14 - 38 U/L    ALT 22 15 - 48 U/L    Alkaline Phosphatase 84 38 - 126 U/L   Lipid panel   Result Value Ref Range    Triglycerides 306 (H) 1 - 149 mg/dL    Cholesterol 202 542 - 199 mg/dL    HDL 32 (L) 40 - 59 mg/dL    LDL Calculated 60 60 - 99 mg/dL    VLDL Cholesterol Cal 61.2 (  H) 11 - 41 mg/dL    Chol/HDL Ratio 4.8 <1.6    Non-HDL Cholesterol 121 mg/dL    FASTING Unknown    CBC w/ Differential   Result Value Ref Range    WBC 10.9 4.5 - 11.0 10*9/L    RBC 4.22 4.00 - 5.20 10*12/L    HGB 11.8 (L) 12.0 - 16.0 g/dL    HCT 10.9 60.4 - 54.0 %    MCV 85.7 80.0 - 100.0 fL    MCH 28.0 26.0 - 34.0 pg    MCHC 32.7 31.0 - 37.0 g/dL    RDW 98.1 (H) 19.1 - 15.0 %    MPV 9.6 7.0 - 10.0 fL    Platelet 53 (L) 150 - 440 10*9/L    Variable HGB Concentration Slight (A) Not Present    Neutrophils % 79.0 %    Lymphocytes % 14.5 %    Monocytes % 4.1 %    Eosinophils % 0.7 %    Basophils % 0.5 %    Absolute Neutrophils 8.6 (H) 2.0 - 7.5 10*9/L    Absolute Lymphocytes 1.6 1.5 - 5.0 10*9/L    Absolute Monocytes 0.5 0.2 - 0.8 10*9/L    Absolute Eosinophils 0.1 0.0 - 0.4 10*9/L    Absolute Basophils 0.1 0.0 - 0.1 10*9/L    Large Unstained Cells 1 0 - 4 %    Microcytosis Slight (A) Not Present    Anisocytosis Slight (A) Not Present    Hypochromasia Moderate (A) Not Present

## 2017-04-10 MED ORDER — RUXOLITINIB 5 MG TABLET
ORAL_TABLET | Freq: Two times a day (BID) | ORAL | 1 refills | 0.00000 days | Status: CP
Start: 2017-04-10 — End: 2017-06-01

## 2017-04-10 NOTE — Unmapped (Signed)
AOC Triage Note     Patient: Emily Walter     Reason for call:  medication prescription     Time call returned: 1548     Phone Assessment: No answer, voice mail     Triage Recommendations: Left message that patients Jakafi prescription is in process by Synetta Fail NP with [facility] delivery.  Informed patient of this. Some should call to coordinate delivery.     Patient Response: unknown     Outstanding tasks: Follow up with patient on when he should start medication.     Patient Pharmacy has been verified and primary pharmacy has been marked as preferred

## 2017-04-10 NOTE — Unmapped (Signed)
AOC Triage Note     Patient: [place] Walter     Reason for call:  Return patient call    Time call returned: 1124     Phone Assessment: No answer, voice mail     Triage Recommendations: Left message for patient to return call so that I may be able to assist her.  Message sent to Synetta Fail NP to see if prescription was sent in for [facility] if this is the medication that the patient is referring to.     Patient Response: unknown      Outstanding tasks: Await response from Lurena Joiner if this medication prescription was sent in and status on this medication.     Patient Pharmacy has been verified and primary pharmacy has been marked as preferred

## 2017-04-10 NOTE — Unmapped (Signed)
[facility] Special[facility] Medication Referral: No PA required    Medication (Brand/Generic): JAKAFI    Initial FSI Test Claim completed with resulted information below:  No PA required  Patient ABLE to fill at [facility]  Insurance Company:  [employer]  Anticipated Copay: $8.50  Note from Triage: I know there has been some confusion about this medication for this patient in the past. This med still needs to be onboarded, and per the note in Epic on 03/01, it appears the providers are planning to start treatment.    As Co-pay is under $100 defined limit, per policy there will be no further investigation of need for financial assistance at this time unless patient requests. This referral has been communicated to the provider and handed off to the [facility] team for further processing and filling of prescribed medication.   ______________________________________________________________________  Please utilize this referral for viewing purposes as it will serve as the central location for all relevant documentation and updates.

## 2017-04-11 MED FILL — JAKAFI/5MG/TABS: JAKAFI/5MG/TABS | 30 days supply | Qty: 60 | Fill #0

## 2017-04-11 NOTE — Unmapped (Signed)
Spoke with patient spouse and gave him the number for [facility] [phone] in order to inquire regarding patients [facility] prescription. I told him the the pharmacy should be contacting them regarding a delivery date/time.

## 2017-04-11 NOTE — Unmapped (Signed)
[facility]   Patient Onboarding/Medication Counseling    [name] is a 67 y.o. female with primary myelofibrosis who I am counseling today on initiation of therapy.    Medication: Jakafi 5mg     Verified patient's date of birth / HIPAA.      Education Provided: ??    Dose/Administration discussed: Take 1 tablet by mouth 2 times daily. This medication should be taken  without regard to food.  Stressed the importance of taking medication as prescribed and to contact provider if that changes at any time.  Discussed missed dose instructions.    Storage requirements: this medicine should be stored at room temperature.     Side effects / precautions discussed: Discussed common side effects, including, but not limited to, diarrhea, constipation, headache, dizziness, etc.. If patient experiences signs/sympoms of an allergic reaction (hives/rash/itching, red/swollen/blistered/peeling skin, wheezing, tightness in chest/throat, difficultly breathing/swallowing/talking, unusual hoarseness, swelling of mouth/face/lips/tongue/throat, etc.), increased risk of infection, s/sx of bleeding, painful skin rash/blisters, upset stomach/vomitting, etc., they need to call the doctor.  Patient will receive a drug information handout with shipment.    Handling precautions / disposal reviewed:  Patient was counseled on the hazards surrounding oral chemotherapy and will minimize contact/exposure to the medicine.    Drug Interactions: other medications reviewed and up to date in Epic.  No drug interactions identified, but counseled patients to avoid grapefruit and grapefruit juice.    Comorbidities/Allergies: reviewed and up to date in Epic.    Verified therapy is appropriate and should continue      Delivery Information    Medication Assistance provided: Prior Authorization    Anticipated copay of $8.50 reviewed with patient. Verified delivery address in FSI and reviewed medication storage requirement.    Scheduled delivery date: 04/13/17    Explained that we ship using UPS or courier and this shipment will not require a signature.      Explained the services we provide at [facility] and that each month we would call to set up refills.  Stressed importance of returning phone calls so that we could ensure they receive their medications in time each month.  Informed patient that we should be setting up refills 7-10 days prior to when they will run out of medication.  Informed patient that welcome packet will be sent.      Patient verbalized understanding of the above information as well as how to contact the pharmacy at [phone] option 4 with any questions/concerns.  The pharmacy is open Monday through Friday 8:30am-4:30pm.  A pharmacist is available 24/7 via pager to answer any clinical questions they may have.        Patient Specific Needs      ? Patient has no physical, cognitive, or cultural barriers.    ? Patient prefers to have medications discussed with  Patient     ? Patient is able to read and understand education materials at a high school level or above.    ? Patient's primary language is  English           Lupita Shutter  [facility] Specialty Pharmacist

## 2017-04-11 NOTE — Unmapped (Signed)
Patient Emily Walter called back regarding a missed call. I notified patient that the call had been about her [facility] prescription.     Patient would like to be contacted today at [phone].    Thanks in advance,  Kelli Hope  [facility]  [phone]

## 2017-04-20 NOTE — Unmapped (Signed)
Patient contacted the Communication Center requesting to speak with Dr. Kathrynn Humble regarding the patient's insurance.     Patient may be reached at [phone].    Thanks in advance,  Jodi Mourning  [facility]  [phone]

## 2017-04-23 NOTE — Unmapped (Signed)
AOC Triage Note     Patient: [place] Walter     Reason for call:  Return call    Time call returned: 1558     Phone Assessment: Spoke with patient and her insurance is requiring the pathology reports for her diagnosis. She would like them mailed to her home.     Triage Recommendations: Informed patient I would mail these reports to her address provided     Patient Response: grateful     Outstanding tasks: Mailed her pathology reports to her home.     Patient Pharmacy has been verified and primary pharmacy has been marked as preferred

## 2017-05-02 NOTE — Unmapped (Signed)
[facility] Refill and Clinical Coordination Note  Medication(s): Jakafi 5mg     Emily Walter, DOB: 03-27-1950  Phone: [phone] (home) , Alternate phone contact: N/A  Shipping address: [id number]  [place] [address]  Phone or address changes today?: No  All above HIPAA information verified.  Insurance changes? No    Completed refill and clinical call assessment today to schedule patient's medication shipment from the [facility] [phone]).      MEDICATION RECONCILIATION    Confirmed the medication and dosage are correct and have not changed: Yes, regimen is correct and unchanged.    Were there any changes to your medication(s) in the past month:  No, there are no changes reported at this time.    ADHERENCE    Is this medicine transplant or covered by Medicare Part B? No.    Did you miss any doses in the past 4 weeks? No missed doses reported.  Adherence counseling provided? Not needed     SIDE EFFECT MANAGEMENT    Are you tolerating your medication?:  Emily Walter reports tolerating the medication.  Side effect management discussed: None      Therapy is appropriate and should be continued.    Evidence of clinical benefit: See Epic note from 04/06/17      FINANCIAL/SHIPPING    Delivery Scheduled: Yes, Expected medication delivery date: 05/09/17   Additional medications refilled: No additional medications/refills needed at this time.    The patient will receive an FSI print out for each medication shipped and additional FDA Medication Guides as required.  Patient education from [place] or Robet Leu may also be included in the shipment.    Emily Walter did not have any additional questions at this time.    Delivery address validated in FSI scheduling system: Yes, address listed above is correct.      We will follow up with patient monthly for standard refill processing and delivery.      Thank you,  Lupita Shutter   [facility] Specialty Pharmacist

## 2017-05-08 MED FILL — JAKAFI/5MG/TABS: JAKAFI/5MG/TABS | 30 days supply | Qty: 60 | Fill #1

## 2017-05-29 NOTE — Unmapped (Signed)
[facility] Refill Coordination Note    Specialty Medication(s) to be Shipped:   General Specialty: JAKAFI 5MG  TAB         Emily Walter, DOB: 10-24-1950  Phone: [phone] (home)   Shipping Address: [id number]  [address]  [place] [address]    All above HIPAA information was verified with patient.     Completed refill call assessment today to schedule patient's medication shipment from the [facility] [phone]).       Specialty medication(s) and dose(s) confirmed: Patient reports changes to the regimen as follows: DIZZINESS/SWELLING   Changes to medications: Amada Jupiter reports no changes reported at this time.  Changes to insurance: No  Questions for the pharmacist: No    The patient will receive an FSI print out for each medication shipped and additional FDA Medication Guides as required.  Patient education from [place] or Robet Leu may also be included in the shipment.    DISEASE-SPECIFIC INFORMATION        N/A    ADHERENCE              MEDICARE PART B DOCUMENTATION         SHIPPING     Shipping address confirmed in FSI.     Delivery Scheduled: Yes, Expected medication delivery date:  [phone] via UPS or courier.     Antonietta Barcelona   [facility] Specialty Technician

## 2017-06-01 ENCOUNTER — Other Ambulatory Visit: Admit: 2017-06-01 | Discharge: 2017-06-02 | Payer: MEDICARE

## 2017-06-01 ENCOUNTER — Ambulatory Visit: Admit: 2017-06-01 | Discharge: 2017-06-02 | Payer: MEDICARE | Attending: Adult Health | Primary: Adult Health

## 2017-06-01 DIAGNOSIS — R0602 Shortness of breath: Secondary | ICD-10-CM

## 2017-06-01 DIAGNOSIS — D471 Chronic myeloproliferative disease: Secondary | ICD-10-CM

## 2017-06-01 DIAGNOSIS — D7581 Myelofibrosis: Principal | ICD-10-CM

## 2017-06-01 LAB — CBC W/ AUTO DIFF
BASOPHILS ABSOLUTE COUNT: 0 10*9/L (ref 0.0–0.1)
BASOPHILS RELATIVE PERCENT: 0.3 %
EOSINOPHILS ABSOLUTE COUNT: 0 10*9/L (ref 0.0–0.4)
EOSINOPHILS RELATIVE PERCENT: 0.5 %
HEMATOCRIT: 30.6 % — ABNORMAL LOW (ref 36.0–46.0)
HEMOGLOBIN: 10.2 g/dL — ABNORMAL LOW (ref 12.0–16.0)
LARGE UNSTAINED CELLS: 2 % (ref 0–4)
LYMPHOCYTES ABSOLUTE COUNT: 1.6 10*9/L (ref 1.5–5.0)
LYMPHOCYTES RELATIVE PERCENT: 19.4 %
MEAN CORPUSCULAR HEMOGLOBIN CONC: 33.3 g/dL (ref 31.0–37.0)
MEAN CORPUSCULAR HEMOGLOBIN: 28.1 pg (ref 26.0–34.0)
MEAN CORPUSCULAR VOLUME: 84.3 fL (ref 80.0–100.0)
MEAN PLATELET VOLUME: 9.6 fL (ref 7.0–10.0)
MONOCYTES ABSOLUTE COUNT: 0.4 10*9/L (ref 0.2–0.8)
MONOCYTES RELATIVE PERCENT: 4.9 %
NEUTROPHILS ABSOLUTE COUNT: 6 10*9/L (ref 2.0–7.5)
NEUTROPHILS RELATIVE PERCENT: 73.5 %
PLATELET COUNT: 50 10*9/L — ABNORMAL LOW (ref 150–440)
RED BLOOD CELL COUNT: 3.63 10*12/L — ABNORMAL LOW (ref 4.00–5.20)
RED CELL DISTRIBUTION WIDTH: 18 % — ABNORMAL HIGH (ref 12.0–15.0)
WBC ADJUSTED: 8.1 10*9/L (ref 4.5–11.0)

## 2017-06-01 LAB — COMPREHENSIVE METABOLIC PANEL
ALBUMIN: 4.1 g/dL (ref 3.5–5.0)
ALKALINE PHOSPHATASE: 83 U/L (ref 38–126)
ALT (SGPT): 31 U/L (ref 15–48)
ANION GAP: 9 mmol/L (ref 9–15)
AST (SGOT): 24 U/L (ref 14–38)
BILIRUBIN TOTAL: 0.5 mg/dL (ref 0.0–1.2)
BLOOD UREA NITROGEN: 27 mg/dL — ABNORMAL HIGH (ref 7–21)
BUN / CREAT RATIO: 28
CHLORIDE: 105 mmol/L (ref 98–107)
CO2: 28 mmol/L (ref 22.0–30.0)
CREATININE: 0.98 mg/dL (ref 0.60–1.00)
EGFR MDRD AF AMER: >=60 mL/min/{1.73_m2} (ref >=60)
EGFR MDRD NON AF AMER: 56 mL/min/{1.73_m2} — ABNORMAL LOW (ref >=60)
GLUCOSE RANDOM: 143 mg/dL (ref 65–179)
POTASSIUM: 4.1 mmol/L (ref 3.5–5.0)
PROTEIN TOTAL: 7 g/dL (ref 6.5–8.3)
SODIUM: 142 mmol/L (ref 135–145)

## 2017-06-01 LAB — MICROCYTES

## 2017-06-01 LAB — GLUCOSE RANDOM: Glucose:MCnc:Pt:Ser/Plas:Qn:: 143

## 2017-06-01 MED ORDER — RUXOLITINIB 5 MG TABLET
ORAL_TABLET | Freq: Two times a day (BID) | ORAL | 1 refills | 0 days | Status: CP
Start: 2017-06-01 — End: 2018-01-07

## 2017-06-01 NOTE — Unmapped (Signed)
[facility] Follow up Visit    Patient: Emily Walter  MRN: [id number]  DOB: 07-Mar-1950  Date of Visit: 06/01/2017    Referred By: Emily Walter, AGNP  [address]  [address]  [place], [place] [address]    Reason for Visit   Emily Walter is a 67 y.o. here in follow-up for newly diagnosed primary myelofibrosis.     Assessment   #1 Primary myelofibrosis, intermediate-2 by DIPSS-Plus, High-risk by MIPSS70-Plus  #2 Thrombocytopenia - thought secondary to #1  #3 Dyspnea on exertion - of unclear etiology at this time      Ms. Emily Walter is doing fair. She continues to have DOE and feels generally poor.   Her hgb has gone down slightly, as she is in about 1.5 months into her ruxolitinib.  Platelets are stable at 50k.  Unfortunately, not many of her symptoms have resolved since starting this.  She has upcoming appointments with cardiology and vascular to assess her BLE swelling.  Her lungs are clear and lasix has not been of any help.  This is unlikely to be related to her PMF.  She has not seen pulmonology and I would recommend seeing one soon given her history of exposures in the workplace previously.  She had PFTs done here last year.  Previous echo did not show reason for SOB.    Will again have her check labs locally at 4 weeks.  She is unable to get up here every 4 weeks, so we will see her every 8 weeks.    Recommend that she see a pulmonologist locally ASAP.  Referral was placed for [facility] and faxed.  They may need to reach out to PCP for more medical records.     Plan   1. Continue ruxolitinib 5mg  PO BID  2. Lab check locally in 4 weeks   3. Return in 2 months    Emily Walter, AGPCNP-C, MSN, OCN  Nurse Practitioner  Hematologic Malignancies  [facility]  [phone] (phone)  [phone] (fax)  [email or web address]      History of Present Illness     Prior labs show:  2014 at [facility]: normal CBC, WBC 6.5, Hgb 15.2, Plts 181.    CBC from 08/03/2016: WBC 10.3 x 10^9/L, Hgb 12.3 g/dL, MCV 85, plt 52 x 13^2/G.    Due to the cytopenias, Ms. Emily Walter was sent to Dr. Maryan Walter and underwent a bone marrow biopsy. I currently do not have bone marrow slides to review.  Outside [facility] report from 08/03/2016 states,  Hypercellular bone marrow (85%) with hyperplastic hematopoiesis.  Atypical megakaryocyte hyperplasia with focal clustering.  Abnomral localization of immature myeloid precursors.  No significant increase in blasts.  Mild to moderate reticulin fibrosis.    Interval History:  Doing fair today.  New tightness/soreness/aching in her throat when she is walking.  This has been going on for several months, but has worsened.  She has seen her PCP (yesterday), who did not know what was going on.  This predates her Jakafi.  She's been on her Earvin Hansen since March.  She does not feel like any of her SOB or other symptoms have improved since starting it.  Her PCP started a new inhaler.  Has not seen pulmonology, but has had PFTs done here.  Continues with leg pain.      No weight loss.  No early satiety, has strong appetite.  Otherwise, denies new bone pain, fevers, chills, night sweats, lumps/bumps,  tongue swelling, syncope, lightheadedness, constipation or diarrhea, nausea or vomiting, very easy bruising or bleeding, or urinary changes.     Review of Systems      10 systems reviewed and negative except as noted in the History of Present Illness.    Medications     Current Outpatient Medications   Medication Sig Dispense Refill   ??? aspirin (ASPIRIN LOW-STRENGTH) 81 MG chewable tablet Chew 81 mg. Frequency:QD   Dosage:81   MG  Instructions:  Note:Dose: 81 MG     ??? calcium carbonate-vitamin D2 500 mg(1,250mg ) -200 unit tablet Take 1 tablet by mouth Two (2) times a day.     ??? docusate sodium (COLACE) 100 MG capsule Take 100 mg by mouth Two (2) times a day.     ??? enalapril (VASOTEC) 20 MG tablet Take 1 tablet (20 mg total) by mouth daily. Frequency:QD   Dosage:20   MG Instructions:  Note:Dose: 20MG  (Patient taking differently: Take 20 mg by mouth daily. 5 mg daily) 90 tablet 4   ??? fluticasone (FLONASE) 50 mcg/actuation nasal spray 2 sprays by Each Nare route daily. Frequency:PHARMDIR   Dosage:0.0     Instructions:  Note:1 spray in each nostril, twice a day. Dose: 1 SPRAY     ??? furosemide (LASIX) 40 MG tablet Take 40 mg by mouth daily.      ??? loratadine (CLARITIN) 10 mg tablet Take 10 mg by mouth.     ??? lovastatin (MEVACOR) 40 MG tablet Take 1 tablet (40 mg total) by mouth daily. Frequency:QD   Dosage:40   MG  Instructions:  Note:Dose: 40MG  90 tablet 4   ??? MEDICAL SUPPLY ITEM 1 each by Mouth route daily.  Note:Bilevel PAP 15/11 cm H2O, with a backup rate of 10, heated humidity for`E1o3L` nasal dryness, Mask:  ResMed Ultra Mirage Full Face Mask size medium or small.  mask of choice. Dose: 15/11 CM H2O,     ??? naproxen (NAPROSYN) 375 MG tablet TAKE 1 TABLET BY MOUTH TWICE A DAY WITH FOOD  0   ??? omega-3/dha/epa/fish oil (FISH OIL-OMEGA-3 FATTY ACIDS) 300-1,000 mg capsule Take 2 g by mouth daily.     ??? omeprazole (PRILOSEC) 40 MG capsule TAKE ONE CAPSULE BY MOUTH EVERY DAY 90 capsule 4   ??? potassium chloride (KLOR-CON) 10 MEQ CR tablet Take 10 mEq by mouth daily.     ??? ruxolitinib (JAKAFI) 5 mg tablet Take 1 tablet (5 mg total) by mouth Two (2) times a day. 60 tablet 1   ??? sertraline (ZOLOFT) 25 MG tablet Take 25 mg by mouth daily.     ??? VENTOLIN HFA 90 mcg/actuation inhaler USE 2 PUFFS 4 TIMES A DAY AS NEEDED FOR SHORTNESS OF BREATH/WHEEZING  0     No current facility-administered medications for this visit.          Allergies     Allergies   Allergen Reactions   ??? Aspirin Other (See Comments)     GETS SICK AT STOMACH & SOMETIMES HAS TO GO TO ER   ??? Codeine Other (See Comments)     Headache, nausea   ??? Codeine Sulfate    ??? Gabapentin Other (See Comments)     oversedation       Past Medical and Surgical History     Past Medical History:   Diagnosis Date   ??? Anxiety    ??? Arthritis    ??? Cataract    ??? Depression    ??? High cholesterol    ???  Hypertension    ??? Migraine headache    ??? Stomach ulcer      Past Surgical History:   Procedure Laterality Date   ??? SALPINGOOPHORECTOMY Right     12/07/2010   ??? VAGINAL HYSTERECTOMY      12/07/2010       Social History     Social History     Socioeconomic History   ??? Marital status: Married     Spouse name: Not on file   ??? Number of children: Not on file   ??? Years of education: Not on file   ??? Highest education level: Not on file   Occupational History   ??? Not on file   Social Needs   ??? Financial resource strain: Not on file   ??? Food insecurity:     Worry: Not on file     Inability: Not on file   ??? Transportation needs:     Medical: Not on file     Non-medical: Not on file   Tobacco Use   ??? Smoking status: Never Smoker   ??? Smokeless tobacco: Never Used   Substance and Sexual Activity   ??? Alcohol use: No   ??? Drug use: No   ??? Sexual activity: Not on file   Lifestyle   ??? Physical activity:     Days per week: Not on file     Minutes per session: Not on file   ??? Stress: Not on file   Relationships   ??? Social connections:     Talks on phone: Not on file     Gets together: Not on file     Attends religious service: Not on file     Active member of club or organization: Not on file     Attends meetings of clubs or organizations: Not on file     Relationship status: Not on file   Other Topics Concern   ??? Not on file   Social History Narrative   ??? 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  Exposed to 2nd hand smoke; she is a lifelong non-smoker.  Went on disability in 2004-08-24 d/t back and has worsened this year.      Enjoys traveling.  Married - husband is widowed.      Family History   Two sons - the youngest died in 08-25-2011 at the age of 30 from bladder cancer.     Physical Examination     Vitals:    06/01/17 0911   BP: 179/74   Pulse: 99   Resp: 20   Temp: 36.7 ??C (98 ??F)   TempSrc: Oral   SpO2: 96%   Weight: (!) 126.8 kg (279 lb 9.6 oz)   Height: 160 cm (5' 2.99)     GENERAL:  Well-appearing and in no apparent distress.  HEENT:  Anicteric sclera.  No oropharyngeal ulceration nor erythema.  NECK:  No thyromegaly nor masses  LYMPH:  No cervical, supraclavicular, axillary, epitrochlear adenopathy  CV:  RRR.  S1, S2.  No m/r/g.  LUNGS: Good air movement.  CTAB   ABDOMEN:  Soft, nt/nd.  No hepatosplenomegaly.  EXTREMITIES:  No clubbing, cyanosis. +1-2 BLE edema  NEURO:  AO X 4. CN II-XII grossly intact and symmetric.  Gait is examined and normal.  SKIN: No petechiae nor purpura.  No rashes.     Laboratory Testing and Imaging     Results for orders placed or performed  in visit on 06/01/17   Comprehensive Metabolic Panel   Result Value Ref Range    Sodium 142 135 - 145 mmol/L    Potassium 4.1 3.5 - 5.0 mmol/L    Chloride 105 98 - 107 mmol/L    CO2 28.0 22.0 - 30.0 mmol/L    BUN 27 (H) 7 - 21 mg/dL    Creatinine 2.95 6.21 - 1.00 mg/dL    BUN/Creatinine Ratio 28     EGFR MDRD Non Af Amer 56 (L) >=60 mL/min/1.77m2    EGFR MDRD Af Amer >=60 >=60 mL/min/1.66m2    Anion Gap 9 9 - 15 mmol/L    Glucose 143 65 - 179 mg/dL    Calcium 9.3 8.5 - 30.8 mg/dL    Albumin 4.1 3.5 - 5.0 g/dL    Total Protein 7.0 6.5 - 8.3 g/dL    Total Bilirubin 0.5 0.0 - 1.2 mg/dL    AST 24 14 - 38 U/L    ALT 31 15 - 48 U/L    Alkaline Phosphatase 83 38 - 126 U/L   CBC w/ Differential   Result Value Ref Range    WBC 8.1 4.5 - 11.0 10*9/L    RBC 3.63 (L) 4.00 - 5.20 10*12/L    HGB 10.2 (L) 12.0 - 16.0 g/dL    HCT 65.7 (L) 84.6 - 46.0 %    MCV 84.3 80.0 - 100.0 fL    MCH 28.1 26.0 - 34.0 pg    MCHC 33.3 31.0 - 37.0 g/dL    RDW 96.2 (H) 95.2 - 15.0 %    MPV 9.6 7.0 - 10.0 fL    Platelet 50 (L) 150 - 440 10*9/L    Variable HGB Concentration Moderate (A) Not Present    Neutrophils % 73.5 %    Lymphocytes % 19.4 %    Monocytes % 4.9 %    Eosinophils % 0.5 %    Basophils % 0.3 %    Absolute Neutrophils 6.0 2.0 - 7.5 10*9/L    Absolute Lymphocytes 1.6 1.5 - 5.0 10*9/L    Absolute Monocytes 0.4 0.2 - 0.8 10*9/L Absolute Eosinophils 0.0 0.0 - 0.4 10*9/L    Absolute Basophils 0.0 0.0 - 0.1 10*9/L    Large Unstained Cells 2 0 - 4 %    Microcytosis Slight (A) Not Present    Anisocytosis Moderate (A) Not Present    Hypochromasia Slight (A) Not Present

## 2017-06-01 NOTE — Unmapped (Signed)
Continue Jakafi as you have been.  Get your labs drawn in 4 weeks, then we'll see you in clinic in 8 weeks.    Please return in 8 weeks for a follow up visit and labs.    If you have any questions, please do not hesitate to contact us.    Please contact us for any of these symptoms:    1. New bone pain  2. Unintentional weight loss  3. Unexplained fatigue  4. Swelling of your legs  5. Yellowing of the skin or eyes    When reviewing your results, please remember that the results of many of the tests we order can vary somewhat and that variation often means nothing.  Sometimes when we get results back after your clinic visit, if it looks like there???s some variation of that type, we may decide to recheck things sooner than we discussed in clinic.  If you get a call that we want to recheck things sooner, do not panic. It does not mean that things are going wrong.    For appointments & questions Monday through Friday 8 AM??? 5 PM   please call [phone] or Toll free [phone].    On Nights, Weekends and Holidays  Call [phone] and ask for the adult hematologist/oncologist on call.    Markus Jarvis, AGPCNP-C, MSN, OCN  Nurse Practitioner  Hematologic Malignancies  [facility]    Nurse Navigator: Layla Maw, RN  Questions and appointments M-F 8am - 5pm: [phone] or [phone]    [facility]  [address]  [place], [place] [address]  [email or web address]    Results for orders placed or performed in visit on 06/01/17   Comprehensive Metabolic Panel   Result Value Ref Range    Sodium 142 135 - 145 mmol/L    Potassium 4.1 3.5 - 5.0 mmol/L    Chloride 105 98 - 107 mmol/L    CO2 28.0 22.0 - 30.0 mmol/L    BUN 27 (H) 7 - 21 mg/dL    Creatinine 5.18 8.41 - 1.00 mg/dL    BUN/Creatinine Ratio 28     EGFR MDRD Non Af Amer 56 (L) >=60 mL/min/1.57m2    EGFR MDRD Af Amer >=60 >=60 mL/min/1.83m2    Anion Gap 9 9 - 15 mmol/L    Glucose 143 65 - 179 mg/dL    Calcium 9.3 8.5 - 66.0 mg/dL    Albumin 4.1 3.5 - 5.0 g/dL    Total Protein 7.0 6.5 - 8.3 g/dL    Total Bilirubin 0.5 0.0 - 1.2 mg/dL    AST 24 14 - 38 U/L    ALT 31 15 - 48 U/L    Alkaline Phosphatase 83 38 - 126 U/L   CBC w/ Differential   Result Value Ref Range    WBC 8.1 4.5 - 11.0 10*9/L    RBC 3.63 (L) 4.00 - 5.20 10*12/L    HGB 10.2 (L) 12.0 - 16.0 g/dL    HCT 63.0 (L) 16.0 - 46.0 %    MCV 84.3 80.0 - 100.0 fL    MCH 28.1 26.0 - 34.0 pg    MCHC 33.3 31.0 - 37.0 g/dL    RDW 10.9 (H) 32.3 - 15.0 %    MPV 9.6 7.0 - 10.0 fL    Platelet 50 (L) 150 - 440 10*9/L    Variable HGB Concentration Moderate (A) Not Present    Neutrophils % 73.5 %    Lymphocytes % 19.4 %  Monocytes % 4.9 %    Eosinophils % 0.5 %    Basophils % 0.3 %    Absolute Neutrophils 6.0 2.0 - 7.5 10*9/L    Absolute Lymphocytes 1.6 1.5 - 5.0 10*9/L    Absolute Monocytes 0.4 0.2 - 0.8 10*9/L    Absolute Eosinophils 0.0 0.0 - 0.4 10*9/L    Absolute Basophils 0.0 0.0 - 0.1 10*9/L    Large Unstained Cells 2 0 - 4 %    Microcytosis Slight (A) Not Present    Anisocytosis Moderate (A) Not Present    Hypochromasia Slight (A) Not Present

## 2017-06-01 NOTE — Unmapped (Signed)
0813:  Labs drawn and sent for analysis.  Care provided by  Will Bonnet.

## 2017-06-04 MED FILL — JAKAFI/5MG/TABS: JAKAFI/5MG/TABS | 30 days supply | Qty: 60 | Fill #0

## 2017-06-18 NOTE — Unmapped (Signed)
AOC Triage Note     Patient: Emily Walter     Reason for call: Side effects    Time call returned: 1540     Phone Assessment: Spoke with patient who is complaining of shortness of breath, throat aches, and chest tightness with exertion and has been worsening over the last few weeks. Pt. Is able to speak in full sentences on the phone. She has seen her pulmonologist and is schedule to see the cardiologist. Pt. Is concerned that these are side effects of the [facility].      Triage Recommendations: I will reach out to care team for recommendations     Patient Response: Pt verbalizes understanding and is appreciative of phone call.       Outstanding tasks: Notify care team

## 2017-06-18 NOTE — Unmapped (Signed)
Hi,     Patient contacted the Communication Center regarding the following:    Requesting to speak with her nursing team about the side effects from her medication     Please feel free to contact patient at [phone].    Thanks in advance,    Jodi Mourning  [facility]   [phone]

## 2017-06-21 ENCOUNTER — Ambulatory Visit: Admit: 2017-06-21 | Discharge: 2017-06-22 | Disposition: A | Discharge status: Home / Self Care | Payer: MEDICARE

## 2017-06-21 ENCOUNTER — Encounter: Admit: 2017-06-21 | Discharge: 2017-06-22 | Disposition: A | Discharge status: Home / Self Care | Payer: MEDICARE

## 2017-06-21 DIAGNOSIS — D7581 Myelofibrosis: Principal | ICD-10-CM

## 2017-06-21 LAB — CBC W/ AUTO DIFF
BASOPHILS ABSOLUTE COUNT: 0 10*9/L (ref 0.0–0.1)
BASOPHILS RELATIVE PERCENT: 0.2 %
EOSINOPHILS ABSOLUTE COUNT: 0 10*9/L (ref 0.0–0.4)
EOSINOPHILS RELATIVE PERCENT: 0.5 %
HEMOGLOBIN: 9.6 g/dL — ABNORMAL LOW (ref 12.0–16.0)
LARGE UNSTAINED CELLS: 2 % (ref 0–4)
LYMPHOCYTES ABSOLUTE COUNT: 1.3 10*9/L — ABNORMAL LOW (ref 1.5–5.0)
LYMPHOCYTES RELATIVE PERCENT: 15.7 %
MEAN CORPUSCULAR HEMOGLOBIN CONC: 32.7 g/dL (ref 31.0–37.0)
MEAN CORPUSCULAR HEMOGLOBIN: 27.9 pg (ref 26.0–34.0)
MEAN CORPUSCULAR VOLUME: 85.3 fL (ref 80.0–100.0)
MEAN PLATELET VOLUME: 10.3 fL — ABNORMAL HIGH (ref 7.0–10.0)
MONOCYTES ABSOLUTE COUNT: 0.5 10*9/L (ref 0.2–0.8)
NEUTROPHILS ABSOLUTE COUNT: 6.5 10*9/L (ref 2.0–7.5)
NEUTROPHILS RELATIVE PERCENT: 76 %
NUCLEATED RED BLOOD CELLS: 1 /100{WBCs} (ref <=4)
PLATELET COUNT: 58 10*9/L — ABNORMAL LOW (ref 150–440)
RED BLOOD CELL COUNT: 3.43 10*12/L — ABNORMAL LOW (ref 4.00–5.20)
RED CELL DISTRIBUTION WIDTH: 19.1 % — ABNORMAL HIGH (ref 12.0–15.0)
WBC ADJUSTED: 8.5 10*9/L (ref 4.5–11.0)

## 2017-06-21 LAB — COMPREHENSIVE METABOLIC PANEL
ALBUMIN: 4.2 g/dL (ref 3.5–5.0)
ALKALINE PHOSPHATASE: 74 U/L (ref 38–126)
ALT (SGPT): 17 U/L (ref 15–48)
ANION GAP: 12 mmol/L (ref 9–15)
AST (SGOT): 21 U/L (ref 14–38)
BILIRUBIN TOTAL: 0.5 mg/dL (ref 0.0–1.2)
BLOOD UREA NITROGEN: 19 mg/dL (ref 7–21)
BUN / CREAT RATIO: 21
CALCIUM: 9.3 mg/dL (ref 8.5–10.2)
CHLORIDE: 101 mmol/L (ref 98–107)
CO2: 28 mmol/L (ref 22.0–30.0)
CREATININE: 0.89 mg/dL (ref 0.60–1.00)
EGFR MDRD AF AMER: >=60 mL/min/{1.73_m2} (ref >=60)
EGFR MDRD NON AF AMER: >=60 mL/min/{1.73_m2} (ref >=60)
PROTEIN TOTAL: 7 g/dL (ref 6.5–8.3)
SODIUM: 141 mmol/L (ref 135–145)

## 2017-06-21 LAB — LYMPHOCYTES ABSOLUTE COUNT: Lab: 1.3 — ABNORMAL LOW

## 2017-06-21 LAB — BLOOD GAS, VENOUS
BASE EXCESS VENOUS: 3.4 — ABNORMAL HIGH (ref -2.0–2.0)
HCO3 VENOUS: 29 mmol/L — ABNORMAL HIGH (ref 22–27)
PCO2 VENOUS: 55 mmHg (ref 40–60)
PH VENOUS: 7.34 (ref 7.32–7.43)

## 2017-06-21 LAB — BLOOD UREA NITROGEN: Urea nitrogen:MCnc:Pt:Ser/Plas:Qn:: 19

## 2017-06-21 LAB — POLYCHROMASIA

## 2017-06-21 LAB — PRO-BNP: Natriuretic peptide.B prohormone N-Terminal:MCnc:Pt:Ser/Plas:Qn:: 83.6

## 2017-06-21 LAB — PCO2 VENOUS: Lab: 55

## 2017-06-21 NOTE — Unmapped (Signed)
AOC Triage Note     Patient: Emily Walter     Reason for call: Jakafi questions/ increased SOB/dizziness    Time call returned: 9:54     Phone Assessment: Spoke with patient, c/o increased SOB with mobility (chest discomfort) & dizziness.     Triage Recommendations: Will contact provider & return call to patient     Patient Response: Patient verbalized understanding     Outstanding tasks: Notification

## 2017-06-21 NOTE — Unmapped (Signed)
Hi,     Patient contacted the Communication Center regarding the following:    Still having side effects from [facility] and has not gone her PCP for labs because she has been waiting to hear back from the care team.    Please feel free to contact patient at [phone].    Thanks in advance,    Drema Balzarine  [facility] Communication Center   [phone]

## 2017-06-21 NOTE — Unmapped (Signed)
AOC Triage Note     Patient: Emily Walter     Reason for call: SOB - Chest discomfort - Dizzy-directed to ED    Time call returned: 10:14     Phone Assessment: Spoke with patient, instructed patient that Dr. Anise Salvo recommends that she be evaluated today in the ED for her symptoms. Patient states that her husband can take her now.      Triage Recommendations: Advised to go to the nearest ED for evaluation and to proceed now for evaluation.     Patient Response: Patient verbalized understanding     Outstanding tasks: Notification

## 2017-06-21 NOTE — Unmapped (Signed)
Pt presents for shortness of breath, worsening over the past 3 weeks, referred in by oncology

## 2017-06-21 NOTE — Unmapped (Signed)
Hi,     Patient contacted the Communication Center regarding the following:    Requesting to speak with her nurse before she reports to her local ER    Please feel free to contact patient at [phone].    Thanks in advance,    Jodi Mourning  [facility] Communication Center   [phone]

## 2017-06-22 LAB — BASIC METABOLIC PANEL
ANION GAP: 9 mmol/L (ref 9–15)
BLOOD UREA NITROGEN: 19 mg/dL (ref 7–21)
BUN / CREAT RATIO: 20
CALCIUM: 8.5 mg/dL (ref 8.5–10.2)
CHLORIDE: 103 mmol/L (ref 98–107)
CO2: 29 mmol/L (ref 22.0–30.0)
CREATININE: 0.95 mg/dL (ref 0.60–1.00)
EGFR MDRD AF AMER: >=60 mL/min/{1.73_m2} (ref >=60)
EGFR MDRD NON AF AMER: 58 mL/min/{1.73_m2} — ABNORMAL LOW (ref >=60)
GLUCOSE RANDOM: 111 mg/dL (ref 65–179)
POTASSIUM: 4.4 mmol/L (ref 3.5–5.0)

## 2017-06-22 LAB — CBC W/ AUTO DIFF
BASOPHILS ABSOLUTE COUNT: 0 10*9/L (ref 0.0–0.1)
BASOPHILS RELATIVE PERCENT: 0.1 %
EOSINOPHILS RELATIVE PERCENT: 0.4 %
LARGE UNSTAINED CELLS: 2 % (ref 0–4)
LYMPHOCYTES ABSOLUTE COUNT: 1.2 10*9/L — ABNORMAL LOW (ref 1.5–5.0)
LYMPHOCYTES RELATIVE PERCENT: 13 %
MEAN CORPUSCULAR HEMOGLOBIN CONC: 33.4 g/dL (ref 31.0–37.0)
MEAN CORPUSCULAR HEMOGLOBIN: 28.2 pg (ref 26.0–34.0)
MEAN CORPUSCULAR VOLUME: 84.4 fL (ref 80.0–100.0)
MEAN PLATELET VOLUME: 9.4 fL (ref 7.0–10.0)
MONOCYTES RELATIVE PERCENT: 5 %
NEUTROPHILS ABSOLUTE COUNT: 7 10*9/L (ref 2.0–7.5)
NEUTROPHILS RELATIVE PERCENT: 79.6 %
PLATELET COUNT: 54 10*9/L — ABNORMAL LOW (ref 150–440)
RED BLOOD CELL COUNT: 3.31 10*12/L — ABNORMAL LOW (ref 4.00–5.20)
RED CELL DISTRIBUTION WIDTH: 19 % — ABNORMAL HIGH (ref 12.0–15.0)
WBC ADJUSTED: 8.9 10*9/L (ref 4.5–11.0)

## 2017-06-22 LAB — MAGNESIUM: Magnesium:MCnc:Pt:Ser/Plas:Qn:: 2

## 2017-06-22 LAB — PHOSPHORUS: Phosphate:MCnc:Pt:Ser/Plas:Qn:: 3.3

## 2017-06-22 LAB — CO2: Carbon dioxide:SCnc:Pt:Ser/Plas:Qn:: 29

## 2017-06-22 LAB — TROPONIN I: Troponin I.cardiac:MCnc:Pt:Ser/Plas:Qn:: <0.034

## 2017-06-22 LAB — HYPOCHROMIA

## 2017-06-22 NOTE — Unmapped (Signed)
Physician Discharge Summary  I saw and evaluated the patient, participating in the key portions of the service on the day of discharge. I reviewed the resident???s note and agree with the discharge plans and disposition. I personally spent less than 30 minutes in discharge planning services.   Clarene Critchley, MD  Associate Professor  Heme/Onc    ================================      Admit date: 06/21/2017    Discharge date: 06/22/2017    Discharge to: Home    Discharge Service: Oncology/Hematology (MDE)    Discharge Attending Physician: No att. providers found    Discharge Diagnoses: OSA, OHS    Pertinent Test Results:   ??  ?? O2 Sats Heart Rate RPE BP   Pre-Walk Vitals  97   103     ??  ??  ?? O2 Sats Heart Rate RPE Supplemental O2   Minute 1  95  109     Minute 2  91  114     Minute 3  93  106     Minute 4  92  114     Minute 5  91  115     Minute 6  91  116     ??  ??  ?? O2 Sats Heart Rate RPE Supplemental O2   Post-Walk Vitals  92  113     ??  Distance walked:  600 ft  ??    Hospital Course:  Emily Walter is a 67 y.o. woman with a PMHx myelofibrosis, HTN, OSA anddepression who presented to the [facility] ED with worsening dyspnea on exertion.   ??  Dyspnea on exertion: Suspect etiology is known OSA for which patient does not wear BiPAP as she finds it too uncomfortable, in addition to OHS in the setting of recent ~25lbs weight gain. Echocardiogram performed at [facility] on 06/20/2017 revealed normal systolic and diastolic function, mild AS, and mild-moderate MR. Patient was ambulated for 6 minutes and maintained O2 sats >90% throughout. CTA Chest ruled out PE and found no intraparenchymal process beyond atelectasis.  - Follow-up with sleep medicine doctor, currently planning on transitioning to a new OSA machine in the next month.  ??  Normocytic anemia:  Likely due to myelofibrosis, Hgb has been slowly downtrending over last several months, from 12-13 in 11/2016, and now stably ~10. No reports of bleeding. Unlikely to be contributing significantly to dyspnea on exertion.  ??  Primary myelofibrosis: Intermediate-2 by DIPPSS-Plus, high risk by MIPSS70-Plus. Follows with Dr. Anise Salvo of [facility].  ??  Chronic medical conditions: Continued home medication regimen unchanged.     Condition at Discharge: stable  Discharge Medications:      Your Medication List      CONTINUE taking these medications    ASPIRIN LOW-STRENGTH 81 MG chewable tablet  Generic drug:  aspirin  Chew 81 mg. Frequency:QD   Dosage:81   MG  Instructions:  Note:Dose: 81 MG     calcium carbonate-vitamin D2 500 mg(1,250mg ) -200 unit tablet  Take 1 tablet by mouth Two (2) times a day.     docusate sodium 100 MG capsule  Commonly known as:  COLACE  Take 100 mg by mouth Two (2) times a day.     enalapril 5 MG tablet  Commonly known as:  VASOTEC  Take 5 mg by mouth daily.     FLONASE 50 mcg/actuation nasal spray  Generic drug:  fluticasone propionate  2 sprays by Each Nare route daily as needed. Frequency:PHARMDIR  Dosage:0.0     Instructions:  Note:1 spray in each nostril, twice a day. Dose: 1 SPRAY     furosemide 40 MG tablet  Commonly known as:  LASIX  Take 40 mg by mouth daily.     loratadine 10 mg tablet  Commonly known as:  CLARITIN  Take 10 mg by mouth.     lovastatin 40 MG tablet  Commonly known as:  MEVACOR  Take 1 tablet (40 mg total) by mouth daily. Frequency:QD   Dosage:40   MG  Instructions:  Note:Dose: 40MG      naproxen 375 MG tablet  Commonly known as:  NAPROSYN  Take one tablet by mouth twice daily as needed for pain. Take with food.     omeprazole 40 MG capsule  Commonly known as:  PriLOSEC  TAKE ONE CAPSULE BY MOUTH EVERY DAY     potassium chloride 10 MEQ CR tablet  Commonly known as:  KLOR-CON  Take 10 mEq by mouth daily.     ruxolitinib 5 mg tablet  Commonly known as:  JAKAFI  Take 1 tablet (5 mg total) by mouth Two (2) times a day.     sertraline 25 MG tablet  Commonly known as:  ZOLOFT  Take 25 mg by mouth daily.     VENTOLIN HFA 90 mcg/actuation inhaler Generic drug:  albuterol  Inhale two puffs four times a day as needed for shortness of breath or wheezing.            Pending Test Results:       Discharge Instructions:       Follow Up instructions and Outpatient Referrals     Discharge instructions      You were admitted because you were having a hard time breathing. We got imaging of your chest which confirmed that there was no blood clot or infection in your lungs. We spoke to your outpatient cardiologist and the echocardiogram that you had done last week was normal. You would likely benefit from using your BiPAP more frequently at night. We do not think that your symptoms are because of your chemotherapy drug. It is very important that you continue to take this medication every day as prescribed. If you ever have any concerns about taking this medication please call your outpatient oncologist.             Appointments which have been scheduled for you    Jul 30, 2017 10:30 AM EDT  (Arrive by 10:00 AM)  LAB ONLY [place] with ADULT ONC LAB  [facility] ADULT ONCOLOGY LAB DRAW STATION [facility] REGION) [address]  [place] [address]  [phone]      Jul 30, 2017 11:30 AM EDT  (Arrive by 11:00 AM)  RETURN ACTIVE [place] with Vito Berger, MD  [facility] REGION) [address]  [place] [address]  [phone]              I spent greater than 30 minutes in the discharge of this patient.    Sandi Raveling, MD

## 2017-06-22 NOTE — Unmapped (Signed)
VSS with no falls or injuries. Six minute walk assessment completed; no desaturations noted; home oxygen not indicated at this time. PO chemo administered per orders; reinforced importance of adherence to regimen at home. Denies pain. Husband at bedside. Will d/c home with self care. Monitoring pending discharge.     Problem: Adult Inpatient Plan of Care  Goal: Plan of Care Review  Outcome: Discharged to Home  Goal: Patient-Specific Goal (Individualization)  Outcome: Discharged to Home  Goal: Absence of Hospital-Acquired Illness or Injury  Outcome: Discharged to Home  Goal: Optimal Comfort and Wellbeing  Outcome: Discharged to Home  Goal: Readiness for Transition of Care  Outcome: Discharged to Home  Goal: Rounds/Family Conference  Outcome: Discharged to Home     Problem: Asthma Comorbidity  Goal: Maintenance of Asthma Control  Outcome: Discharged to Home     Problem: COPD Comorbidity  Goal: Maintenance of COPD Symptom Control  Outcome: Discharged to Home     Problem: Diabetes Comorbidity  Goal: Blood Glucose Level Within Desired Range  Outcome: Discharged to Home     Problem: Heart Failure Comorbidity  Goal: Maintenance of Heart Failure Symptom Control  Outcome: Discharged to Home     Problem: Hypertension Comorbidity  Goal: Blood Pressure in Desired Range  Outcome: Discharged to Home     Problem: Obstructive Sleep Apnea Risk or Actual (Comorbidity Management)  Goal: Unobstructed Breathing During Sleep  Outcome: Discharged to Home     Problem: Pain Chronic (Persistent) (Comorbidity Management)  Goal: Acceptable Pain Control and Functional Ability  Outcome: Discharged to Home     Problem: Asthma Exacerbation  Goal: Asthma Symptom Relief  Outcome: Discharged to Home     Problem: Gas Exchange Impaired  Goal: Optimal Gas Exchange  Outcome: Discharged to Home

## 2017-06-22 NOTE — Unmapped (Signed)
Report given to erin RN. Patient care transferred at this time.

## 2017-06-22 NOTE — Unmapped (Signed)
Problem: Adult Inpatient Plan of Care  Goal: Plan of Care Review  Outcome: Progressing  Goal: Patient-Specific Goal (Individualization)  Outcome: Progressing  Goal: Absence of Hospital-Acquired Illness or Injury  Outcome: Progressing  Goal: Optimal Comfort and Wellbeing  Outcome: Progressing  Goal: Readiness for Transition of Care  Outcome: Progressing  Goal: Rounds/Family Conference  Outcome: Progressing     Problem: Asthma Comorbidity  Goal: Maintenance of Asthma Control  Outcome: Progressing     Problem: COPD Comorbidity  Goal: Maintenance of COPD Symptom Control  Outcome: Progressing     Problem: Diabetes Comorbidity  Goal: Blood Glucose Level Within Desired Range  Outcome: Progressing     Problem: Heart Failure Comorbidity  Goal: Maintenance of Heart Failure Symptom Control  Outcome: Progressing     Problem: Hypertension Comorbidity  Goal: Blood Pressure in Desired Range  Outcome: Progressing     Problem: Obstructive Sleep Apnea Risk or Actual (Comorbidity Management)  Goal: Unobstructed Breathing During Sleep  Outcome: Progressing     Problem: Pain Chronic (Persistent) (Comorbidity Management)  Goal: Acceptable Pain Control and Functional Ability  Outcome: Progressing     Problem: Asthma Exacerbation  Goal: Asthma Symptom Relief  Outcome: Progressing     Problem: Gas Exchange Impaired  Goal: Optimal Gas Exchange  Outcome: Progressing

## 2017-06-22 NOTE — Unmapped (Signed)
History & Physical  Attending Attestation:  I saw and evaluated the patient, and participated in the key portions of the service. I reviewed and edited the resident's note and agree with the findings and plan.     Clarene Critchley, MD  Associate Professor  Heme/Onc    ===================================================      PCP: Silvestre Gunner, PAC    Assessment/Plan:     Emily Walter is a 67 y.o. woman with a PMHx myelofibrosis, HTN, OSA anddepression who presented to [facility] with worsening dyspnea on exertion.     Dyspnea on exertion: Differential is broad, less likely volume overload/CHF given proBNP <100, ACS unlikely given duration of symptoms and reassuring EKG and negative trop. Viral etiology is possible, however patient without symptoms to suggest as such. 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 TTE 2/19 with normal EF. PFTs 11/18 with moderate restriction, normal diffusion capacity. Feel that diagnosis may be related to chemotherapy and possible due to severe OSA/obesity hypoventilation and BiPAP noncompliance and significant weight gain over past year. VBG with mild CO2 retention. Ruxolitinib can cause dyspnea, so it is possible that it also contributing to her symptoms, especially given correlation between beginning medication and symptoms. Remains stable on RA. Has mild edema on CXR so will trial IV diuretics.   -- repeat TTE, unless able to obtain records from recent TTE at [facility]   -- f/u CTA  -- duonebs prn  -- give lasix 40mg  IV x1, strict ins and outs  -- BiPAP at night  -- discuss chemo options with primary oncologist and pharmacy in the morning  -- O2 walk test    Normocytic anemia: likely due to myelofibrosis, Hgb has been slowly downtrending over last several months, from 12-13 in 11/2016, and now ~10. No reports of bleeding. Would not expect her dyspnea to be symptomatic anemia given only mild drop in her hemoglobin, but also not impossible.  -- CTM with daily CBC    Primary myelofibrosis: intermediate-2 by DIPPSS-Plus, high risk by MIPSS70-Plus. Follows with Markus Jarvis   -- continue ruxolitinib 5mg  BID, however may be contributing to symptoms so should discuss if alternate agent available   -- notify primary oncologist of admission in the morning    Thrombocytopenia: presumed 2/2 myelofibrosis, stable ~50s   -- hold home aspirin    HTN: continue home imdur 30mg  daily, enalapril 20mg  daily, lovastatin   Severe OSA: noncompliant with BiPAP at home, trial during hospitalization   Depression: continue home sertraline 25mg  daily    FEN/GI/PPX  Diet: Regular  IVF: not indicated  Monitor and replace lytes prn  DVT: SCDs 2/2 TCP  GI: home PPI  Lines: PIV    Code status: Full code    Dispo: Admit to E1, floor status    History of Present Illness:     Emily Walter is a 67 y.o. woman with a PMHx recently diagnosed myelofibrosis, severe OSA presenting to [facility] with shortness of breath. She has been having worsening shortness of breath with activity for the past several weeks that has acutely worsened in the last few days. She previously was able to walk from one end of her trailer to the other without feeling winded, and now can hardly walk from one room to the other without having to stop to catch her breath.  She has also been unable to lie flat during this time as well. She has swelling in her lower legs that has been ongoing for the past several months and recently had dopplers with an external cardiologist, as well as an echo, but she has yet to hear the results. She has associated palpitations and throat pain during these episodes. She denies chest pain but describes her pain as starting in her upper chest (throat) and moving in to her neck, and it is burning in nature. Her symptoms are not related to positions.    She denies fevers, chills, nausea, vomiting, sick contacts, recent travel or productive cough.     Of note, she reports a 40lb weight gain over the past 4 months. She states that she uses her BiPAP 1-2 times per week and doesn't like wearing it because the mask isn't comfortable.     In the ED: VSS, satting >95% on RA. Labs notable for Hgb 9.6, Plt 58, normal CMP, pro-BNP 83. EKG without ischemia. CXR with nonspecific prominent linear opacities. CTA ordered. She did not receive any medications in the ED. Patient was called in for admission due to subjective dyspnea.     Wt Readings from Last 6 Encounters:   06/01/17 (!) 126.8 kg (279 lb 9.6 oz)   04/06/17 (!) 121.6 kg (268 lb 1.6 oz)   03/09/17 (!) 122.6 kg (270 lb 4.8 oz)   12/22/16 (!) 115.3 kg (254 lb 3.2 oz)   12/04/16 (!) 115.5 kg (254 lb 11.2 oz)   10/23/16 (!) 115.6 kg (254 lb 12.8 oz)       Allergies:  Codeine; Codeine sulfate; and Gabapentin    Medications:   Prior to Admission medications    Medication Sig Start Date End Date Taking? Authorizing Provider   aspirin (ASPIRIN LOW-STRENGTH) 81 MG chewable tablet Chew 81 mg. Frequency:QD   Dosage:81   MG  Instructions:  Note:Dose: 81 MG 02/17/09   Historical Provider, MD   calcium carbonate-vitamin D2 500 mg(1,250mg ) -200 unit tablet Take 1 tablet by mouth Two (2) times a day.    Historical Provider, MD   docusate sodium (COLACE) 100 MG capsule Take 100 mg by mouth Two (2) times a day.    Historical Provider, MD   enalapril (VASOTEC) 20 MG tablet Take 1 tablet (20 mg total) by mouth daily. Frequency:QD   Dosage:20   MG  Instructions:  Note:Dose: 20MG   Patient taking differently: Take 20 mg by mouth daily. 5 mg daily 07/30/12   Amy Sedalia Muta, MD   fluticasone [facility]) 50 mcg/actuation nasal spray 2 sprays by Each Nare route daily. Frequency:PHARMDIR   Dosage:0.0     Instructions:  Note:1 spray in each nostril, twice a day. Dose: 1 SPRAY 02/08/10   Historical Provider, MD   furosemide (LASIX) 40 MG tablet Take 40 mg by mouth daily. Historical Provider, MD   loratadine (CLARITIN) 10 mg tablet Take 10 mg by mouth.    Historical Provider, MD   lovastatin (MEVACOR) 40 MG tablet Take 1 tablet (40 mg total) by mouth daily. Frequency:QD   Dosage:40   MG  Instructions:  Note:Dose: 40MG  07/30/12   Amy Sedalia Muta, MD   MEDICAL SUPPLY ITEM 1 each by Mouth route daily.  Note:Bilevel PAP 15/11 cm H2O, with a backup rate of 10, heated humidity for`E1o3L` nasal dryness, Mask:  ResMed Ultra Mirage Full Face Mask size medium or small.  mask of choice. Dose: 15/11 CM H2O, 07/19/10   Historical Provider, MD  naproxen (NAPROSYN) 375 MG tablet TAKE 1 TABLET BY MOUTH TWICE A DAY WITH FOOD 02/14/17   Historical Provider, MD   omega-3/dha/epa/fish oil (FISH OIL-OMEGA-3 FATTY ACIDS) 300-1,000 mg capsule Take 2 g by mouth daily.    Historical Provider, MD   omeprazole (PRILOSEC) 40 MG capsule TAKE ONE CAPSULE BY MOUTH EVERY DAY 08/02/12   Amy Sedalia Muta, MD   potassium chloride (KLOR-CON) 10 MEQ CR tablet Take 10 mEq by mouth daily.    Historical Provider, MD   ruxolitinib (JAKAFI) 5 mg tablet Take 1 tablet (5 mg total) by mouth Two (2) times a day. 06/01/17   Vernie Murders, AGNP   sertraline (ZOLOFT) 25 MG tablet Take 25 mg by mouth daily. 01/03/16   Historical Provider, MD   VENTOLIN HFA 90 mcg/actuation inhaler USE 2 PUFFS 4 TIMES A DAY AS NEEDED FOR SHORTNESS OF BREATH/WHEEZING 02/22/17   Historical Provider, MD       Medical History:  Past Medical History:   Diagnosis Date   ??? Anxiety    ??? Arthritis    ??? Cancer (CMS-HCC)    ??? Cataract    ??? Depression    ??? High cholesterol    ??? Hypertension        Surgical History:  Past Surgical History:   Procedure Laterality Date   ??? SALPINGOOPHORECTOMY Right     12/07/2010   ??? VAGINAL HYSTERECTOMY      12/07/2010       Social History:  Tobacco use:   reports that she has never smoked. She has never used smokeless tobacco.  Alcohol use:   reports that she does not drink alcohol.  Drug use:  reports that she does not use drugs.  Living situation: the patient lives with their spouse.    Family History:  The patient's family history includes Stroke in her mother..    Review of Systems:  A 12 system review of systems was negative except as noted in HPI.    Objective:     Physical Exam:  Temp:  [36.8 ??C-37.1 ??C] 36.9 ??C  Heart Rate:  [75-87] 87  Resp:  [16-23] 17  BP: (126-171)/(64-78) 126/66  SpO2:  [95 %-98 %] 96 %  There is no height or weight on file to calculate BMI.    Gen: well nourished, well developed obese woman in NAD   HEENT: PERRL. Conjunctivae and sclerae clear and anicteric   Neck: Supple. Thick neck. No thyromegaly.  CV: RRR. S1 and S2 wnl. No m/r/g.  Pulm: Normal respiratory effort without use of accessory muscles. Intermittent wheeze most notable on L mid and upper lung.  Abd: Soft, NT, ND, normoactive bowel sounds.   Ext:  2+ radial pulses bilaterally. 2+ dorsal pedal pulses bilaterally. Nonpitting edema bilaterally   Skin: No rashes, ecchymosis or petechiae.  Neuro: Appropriate mood and affect. Alert and oriented to person, place, and time. No gross motor or sensory deficits evident.    Test Results  Data Review:  I have reviewed the labs and studies from the last 24 hours.    EKG: NSR, no acute ischemic changes     Imaging: Radiology studies were personally reviewed

## 2017-06-22 NOTE — Unmapped (Signed)
[facility] Emergency Department Provider Note      ED Course, Assessment and Plan     Initial Clinical Impression:    Jun 21, 2017 6:06 PM   Emily Walter is a 67 y.o. y/o female Patient is a 67 y.o. female with PMH of myofibrosis, HTN, high cholesterol, anxiety, and depression presenting to the ED for shortness of breath that has been progressively worsening for the past three weeks.     On exam, the patient appears well and is in NAD. VS are WNL. Patient has difficulty finishing sentences. No significant pitting edema of lower extremities. Heart rate and rhythm is regular.     Differential includes pneumonitis from chemotherapy vs ACS vs CHF vs PNA.     Plan for CXR, ED POCUS, CBC, CMP, and Pro-BNP.    6:08 PM  Chest XR shows: No focal airspace disease. Prominent linear opacities are nonspecific, differential includes mild pulmonary edema, viral pneumonitis, atypical bacterial infection (e.g. Mycoplasma) among other entities. Mild cardiomegaly.     6:22 PM  Bedside POCUS did not show any acute cardiac abnormalities to explain symptoms.     7:24 PM  CBC, CMP, and Pro-BNP are unremarkable.     Will order CTA chest.     Patient admitted to the oncology service.      The case was discussed with attending physician Dr. Matilde Haymaker who is in agreement with the above assessment and plan       History     CHIEF COMPLAINT:   Chief Complaint   Patient presents with   ??? Shortness of Breath       HPI: Emily Walter is a 67 y.o. female with PMH of myofibrosis, HTN, high cholesterol, anxiety, and depression presenting to the ED for shortness of breath that has been progressively worsening for the past three weeks. Patient states that over the past three weeks she has been experiencing shortness of breath with acute worsening over the past few days. She states that doing daily activities, such as walking from a car to the store and from one room to the next, causes her to need to stop to catch ehr breath. She states that getting out of the car and walking to the store, or walking from room to another causes her need to stop and catch her breath. Patient also endorses a chest flutter when she needs to catch her breath. She also reports increased lower extremity swelling, which she takes lasix for. Additionally, patient reports that she is experiencing mild nausea as of late. She denies cough, productive cough, fever, or any other medical concerns at this time. Of note, patient has myofibrosis and takes ruxolitinib 5mg  PO BID. She is not on home O2.     Patient's echocardiogram on 12/06/2016 shows: Normal left ventricular systolic function, ejection fraction 60-65%.           PAST MEDICAL HISTORY/PAST SURGICAL HISTORY:   Past Medical History:   Diagnosis Date   ??? Anxiety    ??? Arthritis    ??? Cancer (CMS-HCC)     myofibrosis   ??? Cataract    ??? Depression    ??? High cholesterol    ??? Hypertension    ??? Migraine headache    ??? Stomach ulcer        MEDICATIONS:   No current facility-administered medications for this encounter.     Current Outpatient Medications:   ???  aspirin (ASPIRIN LOW-STRENGTH) 81 MG chewable tablet, Chew  81 mg. Frequency:QD   Dosage:81   MG  Instructions:  Note:Dose: 81 MG, Disp: , Rfl:   ???  calcium carbonate-vitamin D2 500 mg(1,250mg ) -200 unit tablet, Take 1 tablet by mouth Two (2) times a day., Disp: , Rfl:   ???  docusate sodium (COLACE) 100 MG capsule, Take 100 mg by mouth Two (2) times a day., Disp: , Rfl:   ???  enalapril (VASOTEC) 20 MG tablet, Take 1 tablet (20 mg total) by mouth daily. Frequency:QD   Dosage:20   MG  Instructions:  Note:Dose: 20MG  (Patient taking differently: Take 20 mg by mouth daily. 5 mg daily), Disp: 90 tablet, Rfl: 4  ???  fluticasone (FLONASE) 50 mcg/actuation nasal spray, 2 sprays by Each Nare route daily. Frequency:PHARMDIR   Dosage:0.0     Instructions:  Note:1 spray in each nostril, twice a day. Dose: 1 SPRAY, Disp: , Rfl:   ???  furosemide (LASIX) 40 MG tablet, Take 40 mg by mouth daily. , Disp: , Rfl:   ???  loratadine (CLARITIN) 10 mg tablet, Take 10 mg by mouth., Disp: , Rfl:   ???  lovastatin (MEVACOR) 40 MG tablet, Take 1 tablet (40 mg total) by mouth daily. Frequency:QD   Dosage:40   MG  Instructions:  Note:Dose: 40MG , Disp: 90 tablet, Rfl: 4  ???  MEDICAL SUPPLY ITEM, 1 each by Mouth route daily.  Note:Bilevel PAP 15/11 cm H2O, with a backup rate of 10, heated humidity for`E1o3L` nasal dryness, Mask:  ResMed Ultra Mirage Full Face Mask size medium or small.  mask of choice. Dose: 15/11 CM H2O,, Disp: , Rfl:   ???  naproxen (NAPROSYN) 375 MG tablet, TAKE 1 TABLET BY MOUTH TWICE A DAY WITH FOOD, Disp: , Rfl: 0  ???  omega-3/dha/epa/fish oil (FISH OIL-OMEGA-3 FATTY ACIDS) 300-1,000 mg capsule, Take 2 g by mouth daily., Disp: , Rfl:   ???  omeprazole (PRILOSEC) 40 MG capsule, TAKE ONE CAPSULE BY MOUTH EVERY DAY, Disp: 90 capsule, Rfl: 4  ???  potassium chloride (KLOR-CON) 10 MEQ CR tablet, Take 10 mEq by mouth daily., Disp: , Rfl:   ???  ruxolitinib (JAKAFI) 5 mg tablet, Take 1 tablet (5 mg total) by mouth Two (2) times a day., Disp: 60 tablet, Rfl: 1  ???  sertraline (ZOLOFT) 25 MG tablet, Take 25 mg by mouth daily., Disp: , Rfl:   ???  VENTOLIN HFA 90 mcg/actuation inhaler, USE 2 PUFFS 4 TIMES A DAY AS NEEDED FOR SHORTNESS OF BREATH/WHEEZING, Disp: , Rfl: 0    ALLERGIES:   Aspirin; Codeine; Codeine sulfate; and Gabapentin    SOCIAL HISTORY:   Social History     Tobacco Use   ??? Smoking status: Never Smoker   ??? Smokeless tobacco: Never Used   Substance Use Topics   ??? Alcohol use: No       FAMILY HISTORY:  Family History   Problem Relation Age of Onset   ??? Stroke Mother         Review of Systems  Constitutional: Negative for fever.  Eyes: Negative for visual changes.  ENT: Negative for sore throat.  Cardiovascular: Negative for chest pain.  Respiratory: Positive for shortness of breath.  Gastrointestinal: Positive for nausea. Negative for abdominal pain, vomiting or diarrhea.  Genitourinary: Negative for dysuria. Musculoskeletal: Negative for back pain.  Skin: Negative for rash.  Neurological: Negative for headaches, focal weakness or numbness.     Physical Exam     VITAL SIGNS:    BP 141/71  -  Pulse 85  - Temp 36.8 ??C (98.2 ??F) (Oral)  - Resp 23  - SpO2 95%     General: no apparent distress   HENT: no stridor, MMM, atraumatic  Eyes: conjunctiva normal, no discharge  Cardiovascular: S1,S2 heard  Respiratory: Patient has difficulty finishing sentences. No respiratory distress, normal chest excursion, no wheezing   Abdomen: soft, non-distended   Skin: no diaphoresis, normal color  Extremities: no cyanosis    Neurologic: No significant pitting edema of lower extremities. CN II-XII grossly intact, no slurred speech or dysarthria, normal sensation and motor function, moves all 4 extremities equally  Psychiatric: Mood appropriate    EKG:     Date/Time of EKG: 06/21/2017     Rate: 89  Rhythm: normal sinus rhythm  PR Interval: 162  QRS Duration: 84  QTc: 445    Narrative Interpretation: NSR. No S/T wave abnormalities. Axis normal.     Radiology     XR Chest 2 views   Preliminary Result      No focal airspace disease.       Prominent linear opacities are nonspecific, differential includes mild pulmonary edema, viral pneumonitis, atypical bacterial infection (e.g. mycoplasma), among other entities.      Mild cardiomegaly.          Labs     Labs Reviewed   CBC W/ AUTO DIFF - Abnormal; Notable for the following components:       Result Value    RBC 3.43 (*)     HGB 9.6 (*)     HCT 29.3 (*)     RDW 19.1 (*)     MPV 10.3 (*)     Platelet 58 (*)     Variable HGB Concentration Moderate (*)     Absolute Lymphocytes 1.3 (*)     Microcytosis Slight (*)     Anisocytosis Moderate (*)     Hypochromasia Marked (*)     All other components within normal limits    Narrative:     Please use the Absolute Differential for reference ranges.    COMPREHENSIVE METABOLIC PANEL   CBC W/ DIFFERENTIAL    Narrative:     The following orders were created for panel order CBC w/ Differential.  Procedure                               Abnormality         Status                     ---------                               -----------         ------                     CBC w/ [id number]]         Abnormal            Final result               Morphology Review[[id number]                               In process                   Please  view results for these tests on the individual orders.   EXTRA TUBES    Narrative:     The following orders were created for panel order ED Extra Tubes.  Procedure                               Abnormality         Status                     ---------                               -----------         ------                     LIGHT BLUE CITRATE EXTR.Marland KitchenMarland Kitchen[[id number]]                      In process                   Please view results for these tests on the individual orders.   PRO-BNP   LIGHT BLUE CITRATE EXTRA TUBE   SLIDE REVIEW         Pertinent labs & imaging results that were available during my care of the patient were reviewed by me and considered in my medical decision making (see chart for details).      Please note- This chart has been created using [employer]. Chart creation errors have been sought, but may not always be located and such creation errors, especially pronoun confusion, do NOT reflect on the standard of medical care.    ______________________________________________________   Documentation assistance was provided by Audry Riles, Scribe, on Jun 21, 2017 at 6:07 PM for Omelia Blackwater, MD.      Jun 21, 2017 10:40 PM. Documentation assistance provided by the scribe. I was present during the time the encounter was recorded. The information recorded by the scribe was done at my direction and has been reviewed and validated by me.          Leata Mouse, MD  Resident  06/21/17 [phone]

## 2017-06-22 NOTE — Unmapped (Addendum)
Emily Walter is a 67 y.o. woman with a PMHx myelofibrosis, HTN, OSA anddepression who presented to the [facility] ED with worsening dyspnea on exertion.   ??  Dyspnea on exertion: Suspect etiology is known OSA for which patient does not wear BiPAP as she finds it too uncomfortable, in addition to OHS in the setting of recent ~25lbs weight gain. Echocardiogram performed at [facility] on 06/20/2017 revealed normal systolic and diastolic function, mild AS, and mild-moderate MR. Patient was ambulated for 6 minutes and maintained O2 sats >90% throughout. CTA Chest ruled out PE and found no intraparenchymal process beyond atelectasis.  - Follow-up with sleep medicine doctor, currently planning on transitioning to a new OSA machine in the next month.  ??  Normocytic anemia: Likely due to myelofibrosis, Hgb has been slowly downtrending over last several months, from 12-13 in 11/2016, and now stably ~10. No reports of bleeding. Unlikely to be contributing significantly to dyspnea on exertion.  ??  Primary myelofibrosis: Intermediate-2 by DIPPSS-Plus, high risk by MIPSS70-Plus. Follows with Dr. Anise Salvo of [facility].  ??  Chronic medical conditions: Continued home medication regimen unchanged.

## 2017-06-22 NOTE — Unmapped (Signed)
Patient rounding complete, call bell in reach, bed locked and in lowest position, patient belongings at bedside and within reach of patient.  Patient updated on plan of care.

## 2017-06-22 NOTE — Unmapped (Signed)
Ambulatory from triage

## 2017-06-22 NOTE — Unmapped (Signed)
OCCUPATIONAL THERAPY  Evaluation (06/22/17 1015)    Patient Name:  Emily Walter       Medical Record Number: [id number]   Date of Birth: 1950/05/01  Sex: Female          OT Treatment Diagnosis:  Decreased activity tolerance     Assessment   67 y.o. woman with a PMHx myelofibrosis, HTN, OSA anddepression who presented to [facility] with worsening dyspnea on exertion. Pt presents to acute OT at baseline level of ADL independence. She was educated on energy conservation techniques to utilize with ADL/IADL at home and importance of IS use while in hospital. She has no further skilled acute OT needs and no post acute OT needs. Could benefit from rollator for energy conservation. Based on the daily activity AM-PAC raw score of 22/24, the pt is considered to be 25.80% impaired with self care. This indicates no post acute OT needs, pt is at baseline level of ADL independence. Will d/c acute OT at this time. After review of contributing co-mobidities and personal factors, clinical presentation and exam findings, patient demonstrates low complexity for evaluation and development of plan of care.          Activity Tolerance During Today's Session  Patient tolerated treatment well    Plan  Planned Frequency of Treatment:  D/C Services for: D/C Services       Planned Interventions:  Adaptive equipment;ADL retraining;Balance activities;Bed mobility;Compensatory tech. training;Conservation;Positioning;Functional mobility;Range of motion;Functional cognition;Endurance activities;Education - Family / caregiver;Education - Patient;Home exercise program;Safety education;Therapeutic exercise;Transfer training;UE Strength / coordination exercise    Post-Discharge Occupational Therapy Recommendations:  OT Post Acute Discharge Recommendations: OT services not indicated    ;    OT DME Recommendations: (rollator)    GOALS:   Patient and Family Goals: To go home       Prognosis:  Good  Positive Indicators:  PLOF/family support   Barriers to Discharge: None    Subjective  Current Status Pt left sitting in chair with RN aware, all immediate needs met, call bell in reach   Prior Functional Status Husband is home with pt all of the time, he helps with LBD and provides supervision for shower transfer/bathing. Pt denies any recent falls. She does all cooking (takes seated rest breaks), husband does all cleaning and driving     Medical Tests / Procedures: Reviewed      Patient / Caregiver reports: I just need to take rest breaks when I am doing things    Past Medical History:   Diagnosis Date   ??? Anxiety    ??? Arthritis    ??? Cancer (CMS-HCC)    ??? Cataract    ??? Depression    ??? High cholesterol    ??? Hypertension     Social History     Tobacco Use   ??? Smoking status: Never Smoker   ??? Smokeless tobacco: Never Used   Substance Use Topics   ??? Alcohol use: No      Past Surgical History:   Procedure Laterality Date   ??? SALPINGOOPHORECTOMY Right     12/07/2010   ??? VAGINAL HYSTERECTOMY      12/07/2010    Family History   Problem Relation Age of Onset   ??? Stroke Mother         Codeine; Codeine sulfate; and Gabapentin     Objective Findings  Precautions / Restrictions  Non-applicable    Weight Bearing  Non-applicable    Required Braces or Orthoses  Non-applicable  Communication Preference  Verbal    Pain  Pt reports baseline back pain, did not quantify     Equipment / Environment  Vascular access (PIV, TLC, Port-a-cath, PICC)    Living Situation   Living environment: Mobile home   Lives With: Spouse   Home Living: One level home   Equipment available at home: None            Cognition   Orientation Level:  Oriented x 4   Arousal/Alertness:  Appropriate responses to stimuli   Attention Span:  Appears intact   Memory:  Appears intact   Following Commands:  Follows all commands and directions without difficulty   Safety Judgment:  Good awareness of safety precautions   Awareness of Errors:  Good awareness of safety precautions   Problem Solving:  Able to problem solve independently   Comments:      Vision / Perception  Vision: Wears glasses for reading only          Hand Function  Hand Dominance: R  Good B) grip     Skin Inspection  Edema to BLE    ROM / Strength/Coordination  UE ROM/ Strength/ Coordination: UE ROM and strength WFL       Sensation:  intact to light touch UE    Balance:  Independent sitting, supervision standing     Mobility/Gait/Transfers: Independent bed mobility, independent sit<>stand, superivsion for functional mobility around room, to bathroom, and to chair without device     ADL:     Grooming: Supervision at sink   Dressing: LB: Min A (husband provides this at home), UB: Setup A   Eating: independent  Toileting: Supervision for toileting and transfer without device        Vitals/ Orthostatics:     With Activity: SpO2: >93% during mobility in room        Interventions Performed During Today's Session: AMPAC: 22/24. Toileting/grooming activity. OOB to chair, functional mobility in room. Pt eduation re: role of acute OT, POC, UE exercises, LE edema management, energy conservation with ADL/IADL, use of IS and importance of using while in hospital, importance of OOB activity          Eval Duration (OT): 28 Min.    Medical Staff Made Aware: RN aware     I attest that I have reviewed the above information.  Signed: Jaclyn Shaggy, OT  Filed 06/22/2017

## 2017-06-22 NOTE — Unmapped (Signed)
Report given to [place], Charity fundraiser. Patient prepared for transport.

## 2017-06-26 NOTE — Unmapped (Signed)
[facility] Refill Coordination Note    Specialty Medication(s) to be Shipped:   General Specialty: JAKAFI 5MG     Other medication(s) to be shipped:      Emily Walter, DOB: [birth year]  Phone: [phone] (home)   Shipping Address: [id number]  [place] [address]    All above HIPAA information was verified with patient.     Completed refill call assessment today to schedule patient's medication shipment from the [facility] [phone]).       Specialty medication(s) and dose(s) confirmed: Regimen is correct and unchanged.   Changes to medications: Amada Jupiter reports no changes reported at this time.  Changes to insurance: No  Questions for the pharmacist: No    The patient will receive an FSI print out for each medication shipped and additional FDA Medication Guides as required.  Patient education from [place] or Robet Leu may also be included in the shipment.    DISEASE-SPECIFIC INFORMATION        N/A    ADHERENCE              MEDICARE PART B DOCUMENTATION         SHIPPING     Shipping address confirmed in FSI.     Delivery Scheduled: Yes, Expected medication delivery date: [phone] via UPS or courier.     Antonietta Barcelona   [facility] Specialty Technician

## 2017-07-03 MED FILL — JAKAFI/5MG/TABS: JAKAFI/5MG/TABS | 30 days supply | Qty: 60 | Fill #1

## 2017-07-24 NOTE — Unmapped (Addendum)
**  ADDENDUM: Contacted by CPP in clinic today, 07/30/17, to inform patient is being tapered off Jakafi, and no further supply is needed to be sent.  Canceling scheduled shipment and refill request.  Unenrolling from specialty pharmacy outreach services as the patient is no longer taking a specialty medication.[facility]        [facility] Refill Coordination Note    Specialty Medication(s) to be Shipped:   General Specialty: JAKAFI 5MG     Other medication(s) to be shipped:       Emily Walter, DOB: [birth year]  Phone: [phone] (home)   Shipping Address: [id number]  [address]  [place] [address]    All above HIPAA information was verified with patient.     Completed refill call assessment today to schedule patient's medication shipment from the [facility] [phone]).       Specialty medication(s) and dose(s) confirmed: Regimen is correct and unchanged.   Changes to medications: Emily Walter reports no changes reported at this time.  Changes to insurance: No  Questions for the pharmacist: No    The patient will receive an FSI print out for each medication shipped and additional FDA Medication Guides as required.  Patient education from Emily Walter or Emily Walter may also be included in the shipment.    DISEASE-SPECIFIC INFORMATION        N/A    ADHERENCE              MEDICARE PART B DOCUMENTATION         SHIPPING     Shipping address confirmed in FSI.     Delivery Scheduled: Yes, Expected medication delivery date: [phone] via UPS or courier.     Antonietta Barcelona   [facility] Specialty Technician

## 2017-07-30 ENCOUNTER — Ambulatory Visit: Admit: 2017-07-30 | Discharge: 2017-07-31 | Payer: MEDICARE | Attending: Internal Medicine | Primary: Internal Medicine

## 2017-07-30 ENCOUNTER — Other Ambulatory Visit: Admit: 2017-07-30 | Discharge: 2017-07-31 | Payer: MEDICARE

## 2017-07-30 DIAGNOSIS — D7581 Myelofibrosis: Principal | ICD-10-CM

## 2017-07-30 DIAGNOSIS — Z79899 Other long term (current) drug therapy: Secondary | ICD-10-CM

## 2017-07-30 DIAGNOSIS — R0609 Other forms of dyspnea: Secondary | ICD-10-CM

## 2017-07-30 DIAGNOSIS — D649 Anemia, unspecified: Secondary | ICD-10-CM

## 2017-07-30 LAB — COMPREHENSIVE METABOLIC PANEL
ALBUMIN: 4.1 g/dL (ref 3.5–5.0)
ALKALINE PHOSPHATASE: 66 U/L (ref 38–126)
ANION GAP: 10 mmol/L (ref 9–15)
AST (SGOT): 22 U/L (ref 14–38)
BILIRUBIN TOTAL: 0.4 mg/dL (ref 0.0–1.2)
BLOOD UREA NITROGEN: 17 mg/dL (ref 7–21)
BUN / CREAT RATIO: 17
CALCIUM: 9 mg/dL (ref 8.5–10.2)
CHLORIDE: 105 mmol/L (ref 98–107)
CO2: 30 mmol/L (ref 22.0–30.0)
CREATININE: 0.99 mg/dL (ref 0.60–1.00)
EGFR MDRD AF AMER: >=60 mL/min/{1.73_m2} (ref >=60)
EGFR MDRD NON AF AMER: 56 mL/min/{1.73_m2} — ABNORMAL LOW (ref >=60)
GLUCOSE RANDOM: 108 mg/dL (ref 65–179)
POTASSIUM: 4.1 mmol/L (ref 3.5–5.0)
PROTEIN TOTAL: 7.2 g/dL (ref 6.5–8.3)
SODIUM: 145 mmol/L (ref 135–145)

## 2017-07-30 LAB — LIPID PANEL
CHOLESTEROL/HDL RATIO SCREEN: 4.2 (ref <5.0)
HDL CHOLESTEROL: 31 mg/dL — ABNORMAL LOW (ref 40–59)
LDL CHOLESTEROL CALCULATED: 53 mg/dL — ABNORMAL LOW (ref 60–99)
NON-HDL CHOLESTEROL: 99 mg/dL
TRIGLYCERIDES: 231 mg/dL — ABNORMAL HIGH (ref 1–149)
VLDL CHOLESTEROL CAL: 46.2 mg/dL — ABNORMAL HIGH (ref 11–41)

## 2017-07-30 LAB — CBC W/ AUTO DIFF
BASOPHILS ABSOLUTE COUNT: 0 10*9/L (ref 0.0–0.1)
BASOPHILS RELATIVE PERCENT: 0.3 %
EOSINOPHILS ABSOLUTE COUNT: 0 10*9/L (ref 0.0–0.4)
EOSINOPHILS RELATIVE PERCENT: 0.5 %
HEMATOCRIT: 31.8 % — ABNORMAL LOW (ref 36.0–46.0)
HEMOGLOBIN: 10.6 g/dL — ABNORMAL LOW (ref 12.0–16.0)
LARGE UNSTAINED CELLS: 2 % (ref 0–4)
LYMPHOCYTES ABSOLUTE COUNT: 1.5 10*9/L (ref 1.5–5.0)
LYMPHOCYTES RELATIVE PERCENT: 19.8 %
MEAN CORPUSCULAR HEMOGLOBIN CONC: 33.4 g/dL (ref 31.0–37.0)
MEAN CORPUSCULAR HEMOGLOBIN: 28.6 pg (ref 26.0–34.0)
MEAN CORPUSCULAR VOLUME: 85.8 fL (ref 80.0–100.0)
MONOCYTES ABSOLUTE COUNT: 0.4 10*9/L (ref 0.2–0.8)
MONOCYTES RELATIVE PERCENT: 4.6 %
NEUTROPHILS ABSOLUTE COUNT: 5.7 10*9/L (ref 2.0–7.5)
NEUTROPHILS RELATIVE PERCENT: 73.4 %
RED BLOOD CELL COUNT: 3.71 10*12/L — ABNORMAL LOW (ref 4.00–5.20)
RED CELL DISTRIBUTION WIDTH: 18.6 % — ABNORMAL HIGH (ref 12.0–15.0)
WBC ADJUSTED: 7.8 10*9/L (ref 4.5–11.0)

## 2017-07-30 LAB — HDL CHOLESTEROL: Cholesterol.in HDL:MCnc:Pt:Ser/Plas:Qn:: 31 — ABNORMAL LOW

## 2017-07-30 LAB — SMEAR REVIEW

## 2017-07-30 LAB — MONOCYTES RELATIVE PERCENT: Lab: 4.6

## 2017-07-30 LAB — FERRITIN: Ferritin:MCnc:Pt:Ser/Plas:Qn:: 40.6

## 2017-07-30 LAB — ALT (SGPT): Alanine aminotransferase:CCnc:Pt:Ser/Plas:Qn:: 27

## 2017-07-30 NOTE — Unmapped (Signed)
1010:  Labs drawn and sent for analysis.  Care provided by  Will Bonnet

## 2017-07-30 NOTE — Unmapped (Addendum)
1. When your hip pain is stable, wean off of Jakafi as follows:  - 1 tab daily x 7 days, then stop.  - If you feel terrible, please take 1 tab every other day  - follow-up in ~3-4 months    Rozetta Nunnery, M.D.  Assistant Professor  Division of Hematology-Oncology    [address]  3rd floor Physicians' Office Building  [address]  [place] [address]    Nurse Navigator: Layla Maw, RN - [phone]  Questions and appointments M-F 8am - 5pm: [phone] or [phone]          Results for orders placed or performed in visit on 07/30/17   Comprehensive Metabolic Panel   Result Value Ref Range    Sodium 145 135 - 145 mmol/L    Potassium 4.1 3.5 - 5.0 mmol/L    Chloride 105 98 - 107 mmol/L    CO2 30.0 22.0 - 30.0 mmol/L    BUN 17 7 - 21 mg/dL    Creatinine 5.17 6.16 - 1.00 mg/dL    BUN/Creatinine Ratio 17     EGFR MDRD Non Af Amer 56 (L) >=60 mL/min/1.71m2    EGFR MDRD Af Amer >=60 >=60 mL/min/1.110m2    Anion Gap 10 9 - 15 mmol/L    Glucose 108 65 - 179 mg/dL    Calcium 9.0 8.5 - 07.3 mg/dL    Albumin 4.1 3.5 - 5.0 g/dL    Total Protein 7.2 6.5 - 8.3 g/dL    Total Bilirubin 0.4 0.0 - 1.2 mg/dL    AST 22 14 - 38 U/L    ALT 27 15 - 48 U/L    Alkaline Phosphatase 66 38 - 126 U/L   Ferritin   Result Value Ref Range    Ferritin 40.6 3.0 - 151.0 ng/mL   Lipid panel   Result Value Ref Range    Triglycerides 231 (H) 1 - 149 mg/dL    Cholesterol 710 626 - 199 mg/dL    HDL 31 (L) 40 - 59 mg/dL    LDL Calculated 53 (L) 60 - 99 mg/dL    VLDL Cholesterol Cal 46.2 (H) 11 - 41 mg/dL    Chol/HDL Ratio 4.2 <9.4    Non-HDL Cholesterol 99 mg/dL    FASTING Unknown    CBC w/ Differential   Result Value Ref Range    WBC 7.8 4.5 - 11.0 10*9/L    RBC 3.71 (L) 4.00 - 5.20 10*12/L    HGB 10.6 (L) 12.0 - 16.0 g/dL    HCT 85.4 (L) 62.7 - 46.0 %    MCV 85.8 80.0 - 100.0 fL    MCH 28.6 26.0 - 34.0 pg    MCHC 33.4 31.0 - 37.0 g/dL    RDW 03.5 (H) 00.9 - 15.0 %    MPV 9.4 7.0 - 10.0 fL    Platelet 55 (L) 150 - 440 10*9/L    Variable HGB Concentration Moderate (A) Not Present    Neutrophils % 73.4 %    Lymphocytes % 19.8 %    Monocytes % 4.6 %    Eosinophils % 0.5 %    Basophils % 0.3 %    Absolute Neutrophils 5.7 2.0 - 7.5 10*9/L    Absolute Lymphocytes 1.5 1.5 - 5.0 10*9/L    Absolute Monocytes 0.4 0.2 - 0.8 10*9/L    Absolute Eosinophils 0.0 0.0 - 0.4 10*9/L    Absolute Basophils 0.0 0.0 - 0.1 10*9/L    Large  Unstained Cells 2 0 - 4 %    Microcytosis Slight (A) Not Present    Anisocytosis Moderate (A) Not Present    Hypochromasia Marked (A) Not Present   Morphology Review   Result Value Ref Range    Smear Review Comments See Comment (A) Undefined

## 2017-10-24 ENCOUNTER — Ambulatory Visit: Admit: 2017-10-24 | Discharge: 2017-10-24 | Payer: MEDICARE | Attending: Adult Health | Primary: Adult Health

## 2017-10-24 ENCOUNTER — Other Ambulatory Visit: Admit: 2017-10-24 | Discharge: 2017-10-24 | Payer: MEDICARE

## 2017-10-24 DIAGNOSIS — D7581 Myelofibrosis: Principal | ICD-10-CM

## 2017-10-24 DIAGNOSIS — M549 Dorsalgia, unspecified: Secondary | ICD-10-CM

## 2017-10-24 DIAGNOSIS — G8929 Other chronic pain: Secondary | ICD-10-CM

## 2017-10-24 DIAGNOSIS — R0609 Other forms of dyspnea: Secondary | ICD-10-CM

## 2017-12-24 ENCOUNTER — Ambulatory Visit: Admit: 2017-12-24 | Discharge: 2018-01-06 | Payer: MEDICARE

## 2017-12-24 DIAGNOSIS — D7581 Myelofibrosis: Principal | ICD-10-CM

## 2017-12-24 DIAGNOSIS — R0609 Other forms of dyspnea: Secondary | ICD-10-CM

## 2018-01-07 ENCOUNTER — Other Ambulatory Visit: Admit: 2018-01-07 | Discharge: 2018-01-08 | Payer: MEDICARE

## 2018-01-07 ENCOUNTER — Ambulatory Visit: Admit: 2018-01-07 | Discharge: 2018-01-08 | Payer: MEDICARE | Attending: Adult Health | Primary: Adult Health

## 2018-01-07 DIAGNOSIS — D7581 Myelofibrosis: Principal | ICD-10-CM

## 2018-01-07 DIAGNOSIS — R319 Hematuria, unspecified: Principal | ICD-10-CM

## 2018-07-02 ENCOUNTER — Emergency Department: Admit: 2018-07-02 | Discharge: 2018-07-03 | Disposition: A | Discharge status: Home / Self Care | Payer: MEDICARE

## 2018-07-02 ENCOUNTER — Ambulatory Visit: Admit: 2018-07-02 | Discharge: 2018-07-03 | Disposition: A | Discharge status: Home / Self Care | Payer: MEDICARE

## 2018-07-02 DIAGNOSIS — R109 Unspecified abdominal pain: Principal | ICD-10-CM

## 2018-07-09 ENCOUNTER — Ambulatory Visit: Admit: 2018-07-09 | Discharge: 2018-07-10 | Payer: MEDICARE | Attending: Urology | Primary: Urology

## 2018-07-09 DIAGNOSIS — N200 Calculus of kidney: Secondary | ICD-10-CM

## 2018-07-09 DIAGNOSIS — N3946 Mixed incontinence: Principal | ICD-10-CM

## 2018-07-16 ENCOUNTER — Institutional Professional Consult (permissible substitution): Admit: 2018-07-16 | Discharge: 2018-07-17 | Payer: MEDICARE | Attending: Urology | Primary: Urology

## 2018-07-16 DIAGNOSIS — N200 Calculus of kidney: Principal | ICD-10-CM

## 2018-08-19 ENCOUNTER — Ambulatory Visit: Admit: 2018-08-19 | Discharge: 2018-08-19 | Payer: MEDICARE

## 2018-08-19 ENCOUNTER — Ambulatory Visit: Admit: 2018-08-19 | Discharge: 2018-08-19 | Payer: MEDICARE | Attending: Family | Primary: Family

## 2018-08-19 DIAGNOSIS — Z01818 Encounter for other preprocedural examination: Principal | ICD-10-CM

## 2018-08-19 DIAGNOSIS — J4520 Mild intermittent asthma, uncomplicated: Secondary | ICD-10-CM

## 2018-08-19 DIAGNOSIS — Z1159 Encounter for screening for other viral diseases: Principal | ICD-10-CM

## 2018-08-19 DIAGNOSIS — F329 Major depressive disorder, single episode, unspecified: Secondary | ICD-10-CM

## 2018-08-19 DIAGNOSIS — K219 Gastro-esophageal reflux disease without esophagitis: Secondary | ICD-10-CM

## 2018-08-19 DIAGNOSIS — G4730 Sleep apnea, unspecified: Secondary | ICD-10-CM

## 2018-08-19 DIAGNOSIS — D7581 Myelofibrosis: Secondary | ICD-10-CM

## 2018-08-19 DIAGNOSIS — E7800 Pure hypercholesterolemia, unspecified: Secondary | ICD-10-CM

## 2018-08-19 DIAGNOSIS — J449 Chronic obstructive pulmonary disease, unspecified: Secondary | ICD-10-CM

## 2018-08-19 DIAGNOSIS — N200 Calculus of kidney: Secondary | ICD-10-CM

## 2018-08-19 DIAGNOSIS — R0609 Other forms of dyspnea: Secondary | ICD-10-CM

## 2018-08-19 DIAGNOSIS — G8929 Other chronic pain: Secondary | ICD-10-CM

## 2018-08-19 DIAGNOSIS — I10 Essential (primary) hypertension: Secondary | ICD-10-CM

## 2018-08-20 DIAGNOSIS — N200 Calculus of kidney: Principal | ICD-10-CM

## 2018-08-21 ENCOUNTER — Ambulatory Visit: Admit: 2018-08-21 | Discharge: 2018-08-21 | Payer: MEDICARE

## 2018-08-21 ENCOUNTER — Encounter
Admit: 2018-08-21 | Discharge: 2018-08-21 | Payer: MEDICARE | Attending: Student in an Organized Health Care Education/Training Program | Primary: Student in an Organized Health Care Education/Training Program

## 2018-08-21 DIAGNOSIS — N200 Calculus of kidney: Principal | ICD-10-CM

## 2018-08-21 MED ORDER — SENNA LAX 8.6 MG TABLET
Freq: Every day | ORAL | 0 refills | 30.00000 days | Status: SS
Start: 2018-08-21 — End: 2018-09-04

## 2018-08-21 MED ORDER — DOCUSATE SODIUM 100 MG CAPSULE
ORAL_CAPSULE | Freq: Two times a day (BID) | ORAL | 0 refills | 30.00000 days | Status: SS
Start: 2018-08-21 — End: 2018-09-04

## 2018-08-21 MED ORDER — TAMSULOSIN 0.4 MG CAPSULE
ORAL_CAPSULE | Freq: Every day | ORAL | 0 refills | 30.00000 days | Status: CP
Start: 2018-08-21 — End: 2019-08-21

## 2018-08-21 MED ORDER — OXYBUTYNIN CHLORIDE 5 MG TABLET
ORAL_TABLET | Freq: Three times a day (TID) | ORAL | 2 refills | 10.00000 days | Status: CP | PRN
Start: 2018-08-21 — End: 2018-08-21
  Filled 2018-08-21: qty 30, 10d supply, fill #0

## 2018-08-21 MED ORDER — OXYBUTYNIN CHLORIDE 5 MG TABLET: 5 mg | tablet | Freq: Three times a day (TID) | 2 refills | 10 days | Status: AC

## 2018-08-21 MED ORDER — TAMSULOSIN 0.4 MG CAPSULE: 0 mg | capsule | Freq: Every day | 0 refills | 30 days | Status: AC

## 2018-08-21 MED ORDER — DOCUSATE SODIUM 100 MG CAPSULE: 100 mg | capsule | Freq: Two times a day (BID) | 0 refills | 30 days | Status: AC

## 2018-08-21 MED ORDER — SENNA 8.6 MG TABLET
ORAL_TABLET | Freq: Every day | ORAL | 0 refills | 30 days | Status: CP
Start: 2018-08-21 — End: 2018-08-21
  Filled 2018-08-21: qty 30, 30d supply, fill #0

## 2018-08-21 MED FILL — DOK 100 MG CAPSULE: ORAL | 30 days supply | Qty: 60 | Fill #0

## 2018-08-21 MED FILL — OXYBUTYNIN CHLORIDE 5 MG TABLET: 10 days supply | Qty: 30 | Fill #0 | Status: AC

## 2018-08-21 MED FILL — SENNA LAX 8.6 MG TABLET: 30 days supply | Qty: 30 | Fill #0 | Status: AC

## 2018-08-21 MED FILL — DOK 100 MG CAPSULE: 30 days supply | Qty: 60 | Fill #0 | Status: AC

## 2018-09-02 ENCOUNTER — Ambulatory Visit: Admit: 2018-09-02 | Discharge: 2018-09-02 | Payer: MEDICARE | Attending: Family | Primary: Family

## 2018-09-02 ENCOUNTER — Ambulatory Visit: Admit: 2018-09-02 | Discharge: 2018-09-02 | Payer: MEDICARE

## 2018-09-02 ENCOUNTER — Encounter: Admit: 2018-09-02 | Discharge: 2018-09-02 | Payer: MEDICARE | Attending: Urology | Primary: Urology

## 2018-09-02 DIAGNOSIS — Z1159 Encounter for screening for other viral diseases: Principal | ICD-10-CM

## 2018-09-02 DIAGNOSIS — N200 Calculus of kidney: Principal | ICD-10-CM

## 2018-09-03 ENCOUNTER — Institutional Professional Consult (permissible substitution): Admit: 2018-09-03 | Discharge: 2018-09-04 | Payer: MEDICARE

## 2018-09-03 DIAGNOSIS — N209 Urinary calculus, unspecified: Principal | ICD-10-CM

## 2018-09-03 DIAGNOSIS — N200 Calculus of kidney: Principal | ICD-10-CM

## 2018-09-03 MED ORDER — SULFAMETHOXAZOLE 800 MG-TRIMETHOPRIM 160 MG TABLET
ORAL_TABLET | Freq: Two times a day (BID) | ORAL | 0 refills | 3 days | Status: CP
Start: 2018-09-03 — End: 2018-09-03

## 2018-09-03 MED ORDER — CIPROFLOXACIN 500 MG TABLET
ORAL_TABLET | Freq: Two times a day (BID) | ORAL | 0 refills | 3.00000 days | Status: CP
Start: 2018-09-03 — End: 2018-09-06
  Filled 2018-09-03: qty 6, 3d supply, fill #0

## 2018-09-03 MED FILL — CIPROFLOXACIN 500 MG TABLET: 3 days supply | Qty: 6 | Fill #0 | Status: AC

## 2018-09-04 ENCOUNTER — Ambulatory Visit: Admit: 2018-09-04 | Discharge: 2018-09-07 | Payer: MEDICARE

## 2018-09-04 ENCOUNTER — Encounter: Admit: 2018-09-04 | Discharge: 2018-09-07 | Payer: MEDICARE | Attending: Anesthesiology | Primary: Anesthesiology

## 2018-09-04 DIAGNOSIS — N209 Urinary calculus, unspecified: Principal | ICD-10-CM

## 2018-09-04 MED ORDER — TAMSULOSIN 0.4 MG CAPSULE
ORAL_CAPSULE | Freq: Every evening | ORAL | 0 refills | 30 days | Status: CP
Start: 2018-09-04 — End: 2019-09-05

## 2018-09-04 MED ORDER — SENNA 8.6 MG TABLET
ORAL_TABLET | Freq: Every day | ORAL | 0 refills | 30 days | Status: CP
Start: 2018-09-04 — End: 2018-10-04

## 2018-09-04 MED ORDER — OXYCODONE 5 MG TABLET
ORAL_TABLET | ORAL | 0 refills | 2.00000 days | Status: CP | PRN
Start: 2018-09-04 — End: 2018-09-09

## 2018-09-04 MED ORDER — DOCUSATE SODIUM 100 MG CAPSULE
ORAL_CAPSULE | Freq: Two times a day (BID) | ORAL | 0 refills | 30 days | Status: CP
Start: 2018-09-04 — End: 2018-10-04

## 2018-09-06 DIAGNOSIS — N209 Urinary calculus, unspecified: Principal | ICD-10-CM

## 2018-10-08 ENCOUNTER — Ambulatory Visit: Admit: 2018-10-08 | Discharge: 2018-10-08 | Payer: MEDICARE | Attending: Urology | Primary: Urology

## 2018-10-08 ENCOUNTER — Ambulatory Visit: Admit: 2018-10-08 | Discharge: 2018-10-08 | Payer: MEDICARE

## 2018-10-08 DIAGNOSIS — Z79899 Other long term (current) drug therapy: Secondary | ICD-10-CM

## 2018-10-08 DIAGNOSIS — Z888 Allergy status to other drugs, medicaments and biological substances status: Secondary | ICD-10-CM

## 2018-10-08 DIAGNOSIS — F419 Anxiety disorder, unspecified: Secondary | ICD-10-CM

## 2018-10-08 DIAGNOSIS — D696 Thrombocytopenia, unspecified: Secondary | ICD-10-CM

## 2018-10-08 DIAGNOSIS — I10 Essential (primary) hypertension: Secondary | ICD-10-CM

## 2018-10-08 DIAGNOSIS — E785 Hyperlipidemia, unspecified: Secondary | ICD-10-CM

## 2018-10-08 DIAGNOSIS — Z885 Allergy status to narcotic agent status: Secondary | ICD-10-CM

## 2018-10-08 DIAGNOSIS — K219 Gastro-esophageal reflux disease without esophagitis: Secondary | ICD-10-CM

## 2018-10-08 DIAGNOSIS — N200 Calculus of kidney: Secondary | ICD-10-CM

## 2018-10-08 DIAGNOSIS — F329 Major depressive disorder, single episode, unspecified: Secondary | ICD-10-CM

## 2018-10-08 DIAGNOSIS — J449 Chronic obstructive pulmonary disease, unspecified: Secondary | ICD-10-CM

## 2018-10-08 DIAGNOSIS — C946 Myelodysplastic disease, not classified: Secondary | ICD-10-CM

## 2018-11-12 MED ORDER — CHOLECALCIFEROL (VITAMIN D3) 50 MCG (2,000 UNIT) TABLET
Freq: Every day | ORAL | 0 days
Start: 2018-11-12 — End: ?

## 2019-04-07 ENCOUNTER — Other Ambulatory Visit: Admit: 2019-04-07 | Discharge: 2019-04-08 | Payer: MEDICARE

## 2019-04-07 ENCOUNTER — Ambulatory Visit: Admit: 2019-04-07 | Discharge: 2019-04-08 | Payer: MEDICARE | Attending: Adult Health | Primary: Adult Health

## 2019-04-07 DIAGNOSIS — D7581 Myelofibrosis: Principal | ICD-10-CM

## 2019-07-08 ENCOUNTER — Telehealth: Admit: 2019-07-08 | Discharge: 2019-07-09 | Payer: MEDICARE | Attending: Adult Health | Primary: Adult Health

## 2019-07-08 DIAGNOSIS — D7581 Myelofibrosis: Principal | ICD-10-CM

## 2019-08-15 MED ORDER — DILT-XR 180 MG CAPSULE, EXTENDED RELEASE
Freq: Every day | ORAL | 0 days
Start: 2019-08-15 — End: ?

## 2019-09-02 ENCOUNTER — Institutional Professional Consult (permissible substitution): Admit: 2019-09-02 | Discharge: 2019-09-03 | Payer: MEDICARE | Attending: Adult Health | Primary: Adult Health

## 2019-09-02 DIAGNOSIS — I872 Venous insufficiency (chronic) (peripheral): Principal | ICD-10-CM

## 2019-09-02 DIAGNOSIS — I10 Essential (primary) hypertension: Principal | ICD-10-CM

## 2019-09-02 DIAGNOSIS — M1711 Unilateral primary osteoarthritis, right knee: Principal | ICD-10-CM

## 2019-09-24 ENCOUNTER — Ambulatory Visit: Admit: 2019-09-24 | Discharge: 2019-09-25 | Payer: MEDICARE | Attending: Internal Medicine | Primary: Internal Medicine

## 2019-09-24 DIAGNOSIS — M1711 Unilateral primary osteoarthritis, right knee: Principal | ICD-10-CM

## 2019-09-24 MED ORDER — DICLOFENAC 1 % TOPICAL GEL
Freq: Four times a day (QID) | TOPICAL | 2 refills | 7 days | Status: CP | PRN
Start: 2019-09-24 — End: ?

## 2019-09-25 ENCOUNTER — Ambulatory Visit: Admit: 2019-09-25 | Discharge: 2019-09-26 | Payer: MEDICARE

## 2019-09-26 ENCOUNTER — Ambulatory Visit: Admit: 2019-09-26 | Discharge: 2019-09-27 | Payer: MEDICARE

## 2019-10-21 ENCOUNTER — Institutional Professional Consult (permissible substitution): Admit: 2019-10-21 | Discharge: 2019-10-22 | Payer: MEDICARE | Attending: Adult Health | Primary: Adult Health

## 2019-10-21 DIAGNOSIS — D7581 Myelofibrosis: Principal | ICD-10-CM

## 2019-11-26 ENCOUNTER — Ambulatory Visit: Admit: 2019-11-26 | Discharge: 2019-11-27 | Payer: MEDICARE | Attending: Internal Medicine | Primary: Internal Medicine

## 2019-11-26 DIAGNOSIS — M1711 Unilateral primary osteoarthritis, right knee: Principal | ICD-10-CM

## 2019-12-20 MED ORDER — TAMSULOSIN 0.4 MG CAPSULE
0 days
Start: 2019-12-20 — End: ?

## 2020-01-12 DIAGNOSIS — D7581 Myelofibrosis: Principal | ICD-10-CM

## 2020-01-13 ENCOUNTER — Institutional Professional Consult (permissible substitution): Admit: 2020-01-13 | Discharge: 2020-01-14 | Payer: MEDICARE | Attending: Adult Health | Primary: Adult Health

## 2020-01-13 ENCOUNTER — Ambulatory Visit: Admit: 2020-01-13 | Discharge: 2020-01-14

## 2020-01-13 DIAGNOSIS — D7581 Myelofibrosis: Principal | ICD-10-CM

## 2020-01-19 ENCOUNTER — Ambulatory Visit: Admit: 2020-01-19 | Discharge: 2020-01-20 | Payer: MEDICARE

## 2020-01-19 DIAGNOSIS — Z6841 Body Mass Index (BMI) 40.0 and over, adult: Principal | ICD-10-CM

## 2020-02-04 ENCOUNTER — Ambulatory Visit: Admit: 2020-02-04 | Discharge: 2020-02-05 | Payer: MEDICARE | Attending: Registered" | Primary: Registered"

## 2020-02-04 DIAGNOSIS — Z6841 Body Mass Index (BMI) 40.0 and over, adult: Principal | ICD-10-CM

## 2020-02-04 DIAGNOSIS — Z713 Dietary counseling and surveillance: Principal | ICD-10-CM

## 2020-02-09 DIAGNOSIS — Z6841 Body Mass Index (BMI) 40.0 and over, adult: Principal | ICD-10-CM

## 2020-02-09 DIAGNOSIS — R0681 Apnea, not elsewhere classified: Principal | ICD-10-CM

## 2020-02-09 DIAGNOSIS — G4710 Hypersomnia, unspecified: Principal | ICD-10-CM

## 2020-02-09 DIAGNOSIS — G4730 Sleep apnea, unspecified: Principal | ICD-10-CM

## 2020-02-09 DIAGNOSIS — I10 Essential (primary) hypertension: Principal | ICD-10-CM

## 2020-02-09 DIAGNOSIS — Z01818 Encounter for other preprocedural examination: Principal | ICD-10-CM

## 2020-02-09 DIAGNOSIS — R0683 Snoring: Principal | ICD-10-CM

## 2020-03-29 ENCOUNTER — Ambulatory Visit: Admit: 2020-03-29 | Discharge: 2020-03-30 | Payer: MEDICARE

## 2020-03-29 DIAGNOSIS — I5032 Chronic diastolic (congestive) heart failure: Principal | ICD-10-CM

## 2020-03-29 DIAGNOSIS — I872 Venous insufficiency (chronic) (peripheral): Principal | ICD-10-CM

## 2020-03-29 DIAGNOSIS — I10 Essential (primary) hypertension: Principal | ICD-10-CM

## 2020-03-29 MED ORDER — SPIRONOLACTONE 25 MG TABLET
ORAL_TABLET | Freq: Every day | ORAL | 6 refills | 30 days | Status: CP
Start: 2020-03-29 — End: ?

## 2020-04-05 DIAGNOSIS — D7581 Myelofibrosis: Principal | ICD-10-CM

## 2020-04-12 ENCOUNTER — Other Ambulatory Visit: Admit: 2020-04-12 | Discharge: 2020-04-13 | Payer: MEDICARE

## 2020-04-12 ENCOUNTER — Ambulatory Visit: Admit: 2020-04-12 | Discharge: 2020-04-13 | Payer: MEDICARE | Attending: Adult Health | Primary: Adult Health

## 2020-04-12 DIAGNOSIS — I5032 Chronic diastolic (congestive) heart failure: Principal | ICD-10-CM

## 2020-04-12 DIAGNOSIS — D7581 Myelofibrosis: Principal | ICD-10-CM

## 2020-05-10 MED ORDER — SERTRALINE 25 MG TABLET
Freq: Every day | ORAL | 0 days
Start: 2020-05-10 — End: ?

## 2020-05-31 ENCOUNTER — Ambulatory Visit: Admit: 2020-05-31 | Discharge: 2020-06-01 | Payer: MEDICARE

## 2020-05-31 DIAGNOSIS — I10 Essential (primary) hypertension: Principal | ICD-10-CM

## 2020-05-31 DIAGNOSIS — I872 Venous insufficiency (chronic) (peripheral): Principal | ICD-10-CM

## 2020-05-31 DIAGNOSIS — R600 Localized edema: Principal | ICD-10-CM

## 2020-06-21 ENCOUNTER — Ambulatory Visit: Admit: 2020-06-21 | Discharge: 2020-06-22 | Payer: MEDICARE | Attending: Vascular Surgery | Primary: Vascular Surgery

## 2020-06-21 DIAGNOSIS — I872 Venous insufficiency (chronic) (peripheral): Principal | ICD-10-CM

## 2020-06-21 DIAGNOSIS — R600 Localized edema: Principal | ICD-10-CM

## 2020-06-28 DIAGNOSIS — D7581 Myelofibrosis: Principal | ICD-10-CM

## 2020-07-08 ENCOUNTER — Ambulatory Visit: Admit: 2020-07-08 | Discharge: 2020-07-09 | Payer: MEDICARE

## 2020-07-08 DIAGNOSIS — F32A Depression, unspecified depression type: Principal | ICD-10-CM

## 2020-07-08 DIAGNOSIS — J42 Unspecified chronic bronchitis: Principal | ICD-10-CM

## 2020-07-08 DIAGNOSIS — Z1211 Encounter for screening for malignant neoplasm of colon: Principal | ICD-10-CM

## 2020-07-08 DIAGNOSIS — I10 Essential (primary) hypertension: Principal | ICD-10-CM

## 2020-07-08 DIAGNOSIS — R197 Diarrhea, unspecified: Principal | ICD-10-CM

## 2020-07-08 DIAGNOSIS — M79604 Pain in right leg: Principal | ICD-10-CM

## 2020-07-08 DIAGNOSIS — R799 Abnormal finding of blood chemistry, unspecified: Principal | ICD-10-CM

## 2020-07-08 DIAGNOSIS — R600 Localized edema: Principal | ICD-10-CM

## 2020-07-08 DIAGNOSIS — Z131 Encounter for screening for diabetes mellitus: Principal | ICD-10-CM

## 2020-07-08 DIAGNOSIS — Z1231 Encounter for screening mammogram for malignant neoplasm of breast: Principal | ICD-10-CM

## 2020-07-08 DIAGNOSIS — R110 Nausea: Principal | ICD-10-CM

## 2020-07-08 MED ORDER — ONDANSETRON HCL 4 MG TABLET
ORAL_TABLET | Freq: Three times a day (TID) | ORAL | 0 refills | 10.00000 days | Status: CP | PRN
Start: 2020-07-08 — End: 2020-07-18

## 2020-07-09 ENCOUNTER — Ambulatory Visit: Admit: 2020-07-09 | Discharge: 2020-07-10 | Payer: MEDICARE

## 2020-07-19 ENCOUNTER — Ambulatory Visit: Admit: 2020-07-19 | Discharge: 2020-07-20 | Payer: MEDICARE | Attending: Internal Medicine | Primary: Internal Medicine

## 2020-07-19 MED ORDER — PREDNISONE 20 MG TABLET
ORAL_TABLET | Freq: Every day | ORAL | 0 refills | 7 days | Status: CP
Start: 2020-07-19 — End: 2020-07-26

## 2020-07-28 ENCOUNTER — Ambulatory Visit: Admit: 2020-07-28 | Discharge: 2020-07-29 | Payer: MEDICARE

## 2020-07-28 DIAGNOSIS — M5431 Sciatica, right side: Principal | ICD-10-CM

## 2020-07-28 MED ORDER — PREDNISONE 20 MG TABLET
ORAL_TABLET | Freq: Every day | ORAL | 0 refills | 7.00000 days | Status: CP
Start: 2020-07-28 — End: 2020-08-04

## 2020-08-19 ENCOUNTER — Ambulatory Visit: Admit: 2020-08-19 | Discharge: 2020-08-20 | Payer: MEDICARE

## 2020-08-23 ENCOUNTER — Ambulatory Visit: Admit: 2020-08-23 | Discharge: 2020-08-23 | Payer: MEDICARE

## 2020-08-23 ENCOUNTER — Ambulatory Visit: Admit: 2020-08-23 | Discharge: 2020-08-23 | Payer: MEDICARE | Attending: Vascular Surgery | Primary: Vascular Surgery

## 2020-08-23 DIAGNOSIS — R600 Localized edema: Principal | ICD-10-CM

## 2020-08-24 ENCOUNTER — Institutional Professional Consult (permissible substitution): Admit: 2020-08-24 | Discharge: 2020-08-25 | Payer: MEDICARE | Attending: Adult Health | Primary: Adult Health

## 2020-08-24 DIAGNOSIS — D7581 Myelofibrosis: Principal | ICD-10-CM

## 2020-08-25 ENCOUNTER — Ambulatory Visit: Admit: 2020-08-25 | Discharge: 2020-08-26 | Payer: MEDICARE

## 2020-08-25 ENCOUNTER — Other Ambulatory Visit: Admit: 2020-08-25 | Discharge: 2020-08-26 | Payer: MEDICARE

## 2020-08-25 DIAGNOSIS — M5431 Sciatica, right side: Principal | ICD-10-CM

## 2020-08-25 DIAGNOSIS — M5441 Lumbago with sciatica, right side: Principal | ICD-10-CM

## 2020-08-25 DIAGNOSIS — G8929 Other chronic pain: Principal | ICD-10-CM

## 2020-08-25 DIAGNOSIS — M533 Sacrococcygeal disorders, not elsewhere classified: Principal | ICD-10-CM

## 2020-08-25 DIAGNOSIS — D7581 Myelofibrosis: Principal | ICD-10-CM

## 2020-09-01 ENCOUNTER — Ambulatory Visit: Admit: 2020-09-01 | Discharge: 2020-09-02 | Payer: MEDICARE

## 2020-09-13 ENCOUNTER — Ambulatory Visit: Admit: 2020-09-13 | Discharge: 2020-09-14 | Payer: MEDICARE

## 2020-09-15 ENCOUNTER — Ambulatory Visit: Admit: 2020-09-15 | Discharge: 2020-09-16 | Payer: MEDICARE

## 2020-09-15 DIAGNOSIS — M48062 Spinal stenosis, lumbar region with neurogenic claudication: Principal | ICD-10-CM

## 2020-09-15 DIAGNOSIS — M5136 Other intervertebral disc degeneration, lumbar region: Principal | ICD-10-CM

## 2020-09-15 DIAGNOSIS — M5441 Lumbago with sciatica, right side: Principal | ICD-10-CM

## 2020-09-15 DIAGNOSIS — G8929 Other chronic pain: Principal | ICD-10-CM

## 2020-09-15 DIAGNOSIS — M533 Sacrococcygeal disorders, not elsewhere classified: Principal | ICD-10-CM

## 2020-09-20 DIAGNOSIS — D7581 Myelofibrosis: Principal | ICD-10-CM

## 2020-09-23 DIAGNOSIS — M5431 Sciatica, right side: Principal | ICD-10-CM

## 2020-11-03 ENCOUNTER — Ambulatory Visit: Admit: 2020-11-03 | Discharge: 2020-11-04 | Payer: MEDICARE | Attending: Internal Medicine | Primary: Internal Medicine

## 2020-11-03 DIAGNOSIS — M79605 Pain in left leg: Principal | ICD-10-CM

## 2020-11-27 MED ORDER — SPIRONOLACTONE 25 MG TABLET
ORAL_TABLET | Freq: Every day | ORAL | 2 refills | 0.00000 days
Start: 2020-11-27 — End: ?

## 2020-11-29 MED ORDER — SPIRONOLACTONE 25 MG TABLET
ORAL_TABLET | Freq: Every day | ORAL | 3 refills | 90 days | Status: CP
Start: 2020-11-29 — End: ?

## 2020-11-30 ENCOUNTER — Ambulatory Visit: Admit: 2020-11-30 | Discharge: 2020-12-01 | Payer: MEDICARE

## 2020-11-30 DIAGNOSIS — N179 Acute kidney failure, unspecified: Principal | ICD-10-CM

## 2020-11-30 DIAGNOSIS — D7581 Myelofibrosis: Principal | ICD-10-CM

## 2020-11-30 DIAGNOSIS — M48062 Spinal stenosis, lumbar region with neurogenic claudication: Principal | ICD-10-CM

## 2020-11-30 DIAGNOSIS — Z131 Encounter for screening for diabetes mellitus: Principal | ICD-10-CM

## 2020-11-30 DIAGNOSIS — R799 Abnormal finding of blood chemistry, unspecified: Principal | ICD-10-CM

## 2020-11-30 DIAGNOSIS — I10 Essential (primary) hypertension: Principal | ICD-10-CM

## 2020-12-28 ENCOUNTER — Institutional Professional Consult (permissible substitution): Admit: 2020-12-28 | Discharge: 2020-12-29 | Payer: MEDICARE | Attending: Adult Health | Primary: Adult Health

## 2021-02-23 ENCOUNTER — Encounter: Admit: 2021-02-23 | Discharge: 2021-02-24 | Payer: MEDICARE | Attending: Adult Health | Primary: Adult Health

## 2021-02-23 ENCOUNTER — Ambulatory Visit: Admit: 2021-02-23 | Discharge: 2021-02-24 | Payer: MEDICARE

## 2021-02-23 ENCOUNTER — Other Ambulatory Visit: Admit: 2021-02-23 | Discharge: 2021-02-24 | Payer: MEDICARE

## 2021-02-23 DIAGNOSIS — D696 Thrombocytopenia, unspecified: Principal | ICD-10-CM

## 2021-04-11 DIAGNOSIS — M48062 Spinal stenosis, lumbar region with neurogenic claudication: Principal | ICD-10-CM

## 2021-04-15 ENCOUNTER — Ambulatory Visit
Admit: 2021-04-15 | Discharge: 2021-04-16 | Payer: MEDICARE | Attending: Physical Medicine & Rehabilitation | Primary: Physical Medicine & Rehabilitation

## 2021-04-15 DIAGNOSIS — M48062 Spinal stenosis, lumbar region with neurogenic claudication: Principal | ICD-10-CM

## 2021-04-21 DIAGNOSIS — D696 Thrombocytopenia, unspecified: Principal | ICD-10-CM

## 2021-04-25 ENCOUNTER — Ambulatory Visit: Admit: 2021-04-25 | Discharge: 2021-04-25 | Payer: MEDICARE | Attending: Internal Medicine | Primary: Internal Medicine

## 2021-04-25 ENCOUNTER — Ambulatory Visit: Admit: 2021-04-25 | Discharge: 2021-04-25 | Payer: MEDICARE | Attending: Adult Health | Primary: Adult Health

## 2021-04-25 ENCOUNTER — Encounter: Admit: 2021-04-25 | Discharge: 2021-04-25 | Payer: MEDICARE | Attending: Adult Health | Primary: Adult Health

## 2021-04-25 ENCOUNTER — Ambulatory Visit: Admit: 2021-04-25 | Discharge: 2021-04-25 | Payer: MEDICARE

## 2021-04-25 ENCOUNTER — Other Ambulatory Visit: Admit: 2021-04-25 | Discharge: 2021-04-25 | Payer: MEDICARE

## 2021-04-25 DIAGNOSIS — D7581 Myelofibrosis: Principal | ICD-10-CM

## 2021-04-25 DIAGNOSIS — D696 Thrombocytopenia, unspecified: Principal | ICD-10-CM

## 2021-05-23 ENCOUNTER — Ambulatory Visit: Admit: 2021-05-23 | Discharge: 2021-05-24 | Payer: MEDICARE

## 2021-05-23 DIAGNOSIS — R600 Localized edema: Principal | ICD-10-CM

## 2021-05-23 DIAGNOSIS — I10 Essential (primary) hypertension: Principal | ICD-10-CM

## 2021-05-31 ENCOUNTER — Ambulatory Visit: Admit: 2021-05-31 | Discharge: 2021-06-01 | Payer: MEDICARE

## 2021-05-31 DIAGNOSIS — I5032 Chronic diastolic (congestive) heart failure: Principal | ICD-10-CM

## 2021-05-31 DIAGNOSIS — G4733 Obstructive sleep apnea (adult) (pediatric): Principal | ICD-10-CM

## 2021-05-31 DIAGNOSIS — I739 Peripheral vascular disease, unspecified: Principal | ICD-10-CM

## 2021-05-31 DIAGNOSIS — E785 Hyperlipidemia, unspecified: Principal | ICD-10-CM

## 2021-05-31 DIAGNOSIS — D696 Thrombocytopenia, unspecified: Principal | ICD-10-CM

## 2021-05-31 DIAGNOSIS — R1310 Dysphagia, unspecified: Principal | ICD-10-CM

## 2021-05-31 DIAGNOSIS — R0609 Other forms of dyspnea: Principal | ICD-10-CM

## 2021-05-31 MED ORDER — EMPAGLIFLOZIN 10 MG TABLET
ORAL_TABLET | Freq: Every morning | ORAL | 11 refills | 30 days | Status: CP
Start: 2021-05-31 — End: 2022-05-31

## 2021-06-06 DIAGNOSIS — M5416 Radiculopathy, lumbar region: Principal | ICD-10-CM

## 2021-06-06 DIAGNOSIS — M5441 Lumbago with sciatica, right side: Principal | ICD-10-CM

## 2021-06-06 DIAGNOSIS — M48062 Spinal stenosis, lumbar region with neurogenic claudication: Principal | ICD-10-CM

## 2021-06-06 DIAGNOSIS — G8929 Other chronic pain: Principal | ICD-10-CM

## 2021-06-17 ENCOUNTER — Ambulatory Visit
Admit: 2021-06-17 | Discharge: 2021-06-18 | Payer: MEDICARE | Attending: Physical Medicine & Rehabilitation | Primary: Physical Medicine & Rehabilitation

## 2021-06-21 ENCOUNTER — Ambulatory Visit: Admit: 2021-06-21 | Discharge: 2021-06-22 | Payer: MEDICARE

## 2021-06-21 ENCOUNTER — Ambulatory Visit
Admit: 2021-06-21 | Discharge: 2021-06-22 | Payer: MEDICARE | Attending: Speech-Language Pathologist | Primary: Speech-Language Pathologist

## 2021-06-23 MED ORDER — ENALAPRIL MALEATE 5 MG TABLET
ORAL_TABLET | Freq: Every day | ORAL | 3 refills | 90 days | Status: CP
Start: 2021-06-23 — End: 2022-06-23

## 2021-06-23 MED ORDER — FUROSEMIDE 40 MG TABLET
ORAL_TABLET | Freq: Every day | ORAL | 3 refills | 90 days | Status: CP
Start: 2021-06-23 — End: 2022-06-23

## 2021-06-23 MED ORDER — LEVOCETIRIZINE 5 MG TABLET
ORAL_TABLET | Freq: Every evening | ORAL | 3 refills | 90 days | Status: CP
Start: 2021-06-23 — End: 2022-06-23

## 2021-06-24 DIAGNOSIS — I5032 Chronic diastolic (congestive) heart failure: Principal | ICD-10-CM

## 2021-06-24 DIAGNOSIS — E7800 Pure hypercholesterolemia, unspecified: Principal | ICD-10-CM

## 2021-06-24 MED ORDER — EMPAGLIFLOZIN 10 MG TABLET
ORAL_TABLET | Freq: Every morning | ORAL | 11 refills | 30 days | Status: CP
Start: 2021-06-24 — End: 2022-06-24

## 2021-06-24 MED ORDER — SERTRALINE 25 MG TABLET
ORAL_TABLET | Freq: Every day | ORAL | 3 refills | 90 days | Status: CP
Start: 2021-06-24 — End: 2022-06-24

## 2021-06-24 MED ORDER — LOVASTATIN 40 MG TABLET
ORAL_TABLET | Freq: Every day | ORAL | 3 refills | 90 days | Status: CP
Start: 2021-06-24 — End: 2022-06-24

## 2021-06-24 MED ORDER — SPIRONOLACTONE 25 MG TABLET
ORAL_TABLET | Freq: Every day | ORAL | 3 refills | 90 days | Status: CP
Start: 2021-06-24 — End: ?

## 2021-06-24 MED ORDER — TAMSULOSIN 0.4 MG CAPSULE
ORAL_CAPSULE | Freq: Every day | ORAL | 3 refills | 90 days | Status: CP
Start: 2021-06-24 — End: 2022-06-24

## 2021-06-24 MED ORDER — OMEPRAZOLE 40 MG CAPSULE,DELAYED RELEASE
ORAL_CAPSULE | Freq: Every day | ORAL | 3 refills | 90 days | Status: CP
Start: 2021-06-24 — End: 2022-06-24

## 2021-06-29 DIAGNOSIS — R1310 Dysphagia, unspecified: Principal | ICD-10-CM

## 2021-07-12 ENCOUNTER — Other Ambulatory Visit: Admit: 2021-07-12 | Discharge: 2021-07-13 | Payer: MEDICARE

## 2021-07-12 ENCOUNTER — Ambulatory Visit: Admit: 2021-07-12 | Discharge: 2021-07-13 | Payer: MEDICARE

## 2021-07-12 DIAGNOSIS — D7581 Myelofibrosis: Principal | ICD-10-CM

## 2021-07-13 ENCOUNTER — Ambulatory Visit: Admit: 2021-07-13 | Discharge: 2021-07-14 | Payer: MEDICARE

## 2021-07-13 ENCOUNTER — Ambulatory Visit
Admit: 2021-07-13 | Discharge: 2021-07-14 | Payer: MEDICARE | Attending: Physical Medicine & Rehabilitation | Primary: Physical Medicine & Rehabilitation

## 2021-07-13 DIAGNOSIS — M5136 Other intervertebral disc degeneration, lumbar region: Principal | ICD-10-CM

## 2021-08-10 ENCOUNTER — Ambulatory Visit: Admit: 2021-08-10 | Discharge: 2021-08-11 | Payer: MEDICARE

## 2021-08-10 DIAGNOSIS — W19XXXA Unspecified fall, initial encounter: Principal | ICD-10-CM

## 2021-08-10 DIAGNOSIS — Y92009 Unspecified place in unspecified non-institutional (private) residence as the place of occurrence of the external cause: Principal | ICD-10-CM

## 2021-08-10 DIAGNOSIS — M25511 Pain in right shoulder: Principal | ICD-10-CM

## 2021-08-10 DIAGNOSIS — G8911 Acute pain due to trauma: Principal | ICD-10-CM

## 2021-08-16 ENCOUNTER — Ambulatory Visit: Admit: 2021-08-16 | Discharge: 2021-08-17 | Payer: MEDICARE

## 2021-08-16 DIAGNOSIS — Z1211 Encounter for screening for malignant neoplasm of colon: Principal | ICD-10-CM

## 2021-08-16 DIAGNOSIS — M79601 Pain in right arm: Principal | ICD-10-CM

## 2021-08-16 DIAGNOSIS — M79641 Pain in right hand: Principal | ICD-10-CM

## 2021-08-16 DIAGNOSIS — M7989 Other specified soft tissue disorders: Principal | ICD-10-CM

## 2021-08-16 DIAGNOSIS — L03113 Cellulitis of right upper limb: Principal | ICD-10-CM

## 2021-08-16 MED ORDER — CEPHALEXIN 500 MG CAPSULE
ORAL_CAPSULE | Freq: Four times a day (QID) | ORAL | 0 refills | 5 days | Status: CP
Start: 2021-08-16 — End: 2021-08-21

## 2021-08-16 MED ORDER — DOXYCYCLINE HYCLATE 100 MG TABLET
ORAL_TABLET | Freq: Two times a day (BID) | ORAL | 0 refills | 5 days | Status: CP
Start: 2021-08-16 — End: 2021-08-21

## 2021-08-31 DIAGNOSIS — D7581 Myelofibrosis: Principal | ICD-10-CM

## 2021-09-05 ENCOUNTER — Other Ambulatory Visit: Admit: 2021-09-05 | Discharge: 2021-09-06 | Payer: MEDICARE

## 2021-09-05 ENCOUNTER — Ambulatory Visit: Admit: 2021-09-05 | Discharge: 2021-09-06 | Payer: MEDICARE | Attending: Internal Medicine | Primary: Internal Medicine

## 2021-09-05 DIAGNOSIS — D7581 Myelofibrosis: Principal | ICD-10-CM

## 2021-09-14 ENCOUNTER — Ambulatory Visit: Admit: 2021-09-14 | Discharge: 2021-09-15 | Payer: MEDICARE

## 2021-09-14 DIAGNOSIS — M79641 Pain in right hand: Principal | ICD-10-CM

## 2021-11-25 ENCOUNTER — Ambulatory Visit: Admit: 2021-11-25 | Discharge: 2021-11-26 | Payer: MEDICARE

## 2021-11-25 DIAGNOSIS — L989 Disorder of the skin and subcutaneous tissue, unspecified: Principal | ICD-10-CM

## 2021-11-25 DIAGNOSIS — Z23 Encounter for immunization: Principal | ICD-10-CM

## 2021-11-25 DIAGNOSIS — L918 Other hypertrophic disorders of the skin: Principal | ICD-10-CM

## 2021-11-25 DIAGNOSIS — R310 Gross hematuria: Principal | ICD-10-CM

## 2021-11-25 DIAGNOSIS — N1831 Stage 3a chronic kidney disease (CMS-HCC): Principal | ICD-10-CM

## 2021-11-25 DIAGNOSIS — R5383 Other fatigue: Principal | ICD-10-CM

## 2021-11-25 DIAGNOSIS — R7303 Prediabetes: Principal | ICD-10-CM

## 2022-01-02 ENCOUNTER — Ambulatory Visit: Admit: 2022-01-02 | Discharge: 2022-01-02 | Payer: MEDICARE | Attending: Adult Health | Primary: Adult Health

## 2022-01-02 ENCOUNTER — Ambulatory Visit: Admit: 2022-01-02 | Discharge: 2022-01-02 | Payer: MEDICARE

## 2022-01-02 ENCOUNTER — Other Ambulatory Visit: Admit: 2022-01-02 | Discharge: 2022-01-02 | Payer: MEDICARE

## 2022-01-02 DIAGNOSIS — D7581 Myelofibrosis: Principal | ICD-10-CM

## 2022-02-27 ENCOUNTER — Ambulatory Visit: Admit: 2022-02-27 | Discharge: 2022-02-28 | Payer: MEDICARE

## 2022-02-27 DIAGNOSIS — M545 Chronic midline low back pain without sciatica: Principal | ICD-10-CM

## 2022-02-27 DIAGNOSIS — E611 Iron deficiency: Principal | ICD-10-CM

## 2022-02-27 DIAGNOSIS — R7303 Prediabetes: Principal | ICD-10-CM

## 2022-02-27 DIAGNOSIS — D696 Thrombocytopenia, unspecified: Principal | ICD-10-CM

## 2022-02-27 DIAGNOSIS — G8929 Other chronic pain: Principal | ICD-10-CM

## 2022-02-27 DIAGNOSIS — D7581 Myelofibrosis: Principal | ICD-10-CM

## 2022-02-27 DIAGNOSIS — R5383 Other fatigue: Principal | ICD-10-CM

## 2022-02-27 DIAGNOSIS — I5032 Chronic diastolic (congestive) heart failure: Principal | ICD-10-CM

## 2022-02-27 MED ORDER — CYCLOBENZAPRINE 5 MG TABLET
ORAL_TABLET | Freq: Two times a day (BID) | ORAL | 2 refills | 15 days | Status: CP | PRN
Start: 2022-02-27 — End: 2022-05-28

## 2022-03-01 DIAGNOSIS — I5032 Chronic diastolic (congestive) heart failure: Principal | ICD-10-CM

## 2022-03-01 MED ORDER — JARDIANCE 10 MG TABLET
ORAL_TABLET | Freq: Every morning | ORAL | 11 refills | 0 days
Start: 2022-03-01 — End: ?

## 2022-03-03 MED ORDER — EMPAGLIFLOZIN 10 MG TABLET
ORAL_TABLET | Freq: Every morning | ORAL | 3 refills | 90 days | Status: CP
Start: 2022-03-03 — End: 2023-02-28

## 2022-03-10 ENCOUNTER — Ambulatory Visit: Admit: 2022-03-10 | Discharge: 2022-03-10 | Payer: MEDICARE

## 2022-03-10 DIAGNOSIS — R310 Gross hematuria: Principal | ICD-10-CM

## 2022-03-10 DIAGNOSIS — N200 Calculus of kidney: Principal | ICD-10-CM

## 2022-03-10 DIAGNOSIS — R944 Abnormal results of kidney function studies: Principal | ICD-10-CM

## 2022-03-10 MED ORDER — TAMSULOSIN 0.4 MG CAPSULE
ORAL_CAPSULE | Freq: Every day | ORAL | 0 refills | 30 days | Status: CP
Start: 2022-03-10 — End: 2022-04-09

## 2022-03-13 ENCOUNTER — Ambulatory Visit: Admit: 2022-03-13 | Discharge: 2022-03-14 | Disposition: A | Discharge status: Home / Self Care | Payer: MEDICARE

## 2022-03-13 ENCOUNTER — Ambulatory Visit: Admit: 2022-03-13 | Discharge: 2022-03-14 | Payer: MEDICARE

## 2022-03-13 ENCOUNTER — Emergency Department: Admit: 2022-03-13 | Discharge: 2022-03-14 | Disposition: A | Discharge status: Home / Self Care | Payer: MEDICARE

## 2022-03-13 DIAGNOSIS — H9192 Unspecified hearing loss, left ear: Principal | ICD-10-CM

## 2022-03-13 DIAGNOSIS — R112 Nausea with vomiting, unspecified: Principal | ICD-10-CM

## 2022-03-13 DIAGNOSIS — R42 Dizziness and giddiness: Principal | ICD-10-CM

## 2022-03-14 MED ORDER — ONDANSETRON 4 MG DISINTEGRATING TABLET
ORAL_TABLET | Freq: Three times a day (TID) | ORAL | 0 refills | 5 days | Status: CP | PRN
Start: 2022-03-14 — End: 2022-03-21

## 2022-03-14 MED ORDER — KETOROLAC 10 MG TABLET
ORAL_TABLET | Freq: Four times a day (QID) | ORAL | 0 refills | 5 days | Status: CP | PRN
Start: 2022-03-14 — End: 2022-03-19

## 2022-03-14 MED ORDER — CEFDINIR 300 MG CAPSULE
ORAL_CAPSULE | Freq: Two times a day (BID) | ORAL | 0 refills | 10 days | Status: CP
Start: 2022-03-14 — End: 2022-03-24

## 2022-03-20 DIAGNOSIS — N200 Calculus of kidney: Principal | ICD-10-CM

## 2022-03-23 ENCOUNTER — Ambulatory Visit: Admit: 2022-03-23 | Discharge: 2022-03-23 | Payer: MEDICARE

## 2022-03-23 DIAGNOSIS — D696 Thrombocytopenia, unspecified: Principal | ICD-10-CM

## 2022-03-23 DIAGNOSIS — K219 Gastro-esophageal reflux disease without esophagitis: Principal | ICD-10-CM

## 2022-03-23 DIAGNOSIS — Z01818 Encounter for other preprocedural examination: Principal | ICD-10-CM

## 2022-03-23 DIAGNOSIS — I5032 Chronic diastolic (congestive) heart failure: Principal | ICD-10-CM

## 2022-03-23 DIAGNOSIS — J4489 Asthma with COPD: Principal | ICD-10-CM

## 2022-03-23 DIAGNOSIS — I10 Essential (primary) hypertension: Principal | ICD-10-CM

## 2022-03-23 DIAGNOSIS — M545 Chronic bilateral low back pain, unspecified whether sciatica present: Principal | ICD-10-CM

## 2022-03-23 DIAGNOSIS — N200 Calculus of kidney: Principal | ICD-10-CM

## 2022-03-23 DIAGNOSIS — F32A Depression, unspecified depression type: Principal | ICD-10-CM

## 2022-03-23 DIAGNOSIS — N182 Chronic kidney disease, stage 2 (mild): Principal | ICD-10-CM

## 2022-03-23 DIAGNOSIS — G8929 Other chronic pain: Principal | ICD-10-CM

## 2022-03-28 ENCOUNTER — Ambulatory Visit: Admit: 2022-03-28 | Discharge: 2022-03-29 | Payer: MEDICARE

## 2022-03-28 DIAGNOSIS — M542 Cervicalgia: Principal | ICD-10-CM

## 2022-03-28 DIAGNOSIS — R42 Dizziness and giddiness: Principal | ICD-10-CM

## 2022-03-28 DIAGNOSIS — H9192 Unspecified hearing loss, left ear: Principal | ICD-10-CM

## 2022-03-28 DIAGNOSIS — H9312 Tinnitus, left ear: Principal | ICD-10-CM

## 2022-03-28 MED ORDER — MECLIZINE 12.5 MG TABLET
ORAL_TABLET | Freq: Three times a day (TID) | ORAL | 0 refills | 10 days | Status: CP | PRN
Start: 2022-03-28 — End: 2022-04-07

## 2022-04-03 ENCOUNTER — Ambulatory Visit: Admit: 2022-04-03 | Discharge: 2022-04-04 | Payer: MEDICARE

## 2022-04-03 DIAGNOSIS — N200 Calculus of kidney: Principal | ICD-10-CM

## 2022-04-06 ENCOUNTER — Ambulatory Visit: Admit: 2022-04-06 | Discharge: 2022-04-07 | Payer: MEDICARE

## 2022-04-11 ENCOUNTER — Ambulatory Visit: Admit: 2022-04-11 | Discharge: 2022-04-11 | Payer: MEDICARE

## 2022-04-11 ENCOUNTER — Institutional Professional Consult (permissible substitution): Admit: 2022-04-11 | Discharge: 2022-04-11 | Payer: MEDICARE

## 2022-04-11 DIAGNOSIS — H9312 Tinnitus, left ear: Principal | ICD-10-CM

## 2022-04-11 DIAGNOSIS — H9192 Unspecified hearing loss, left ear: Principal | ICD-10-CM

## 2022-04-11 DIAGNOSIS — R42 Dizziness and giddiness: Principal | ICD-10-CM

## 2022-04-11 DIAGNOSIS — H9122 Sudden idiopathic hearing loss, left ear: Principal | ICD-10-CM

## 2022-04-11 MED ORDER — PREDNISONE 10 MG TABLET
ORAL_TABLET | ORAL | 0 refills | 15 days | Status: CP
Start: 2022-04-11 — End: 2022-04-26

## 2022-04-17 DIAGNOSIS — H9192 Unspecified hearing loss, left ear: Principal | ICD-10-CM

## 2022-04-17 DIAGNOSIS — H9312 Tinnitus, left ear: Principal | ICD-10-CM

## 2022-04-18 ENCOUNTER — Ambulatory Visit: Admit: 2022-04-18 | Discharge: 2022-04-19 | Payer: MEDICARE

## 2022-04-18 ENCOUNTER — Institutional Professional Consult (permissible substitution): Admit: 2022-04-18 | Discharge: 2022-04-19 | Payer: MEDICARE

## 2022-04-21 DIAGNOSIS — N200 Calculus of kidney: Principal | ICD-10-CM

## 2022-04-23 ENCOUNTER — Ambulatory Visit: Admit: 2022-04-23 | Discharge: 2022-04-24 | Payer: MEDICARE

## 2022-04-23 ENCOUNTER — Encounter: Admit: 2022-04-23 | Discharge: 2022-04-24 | Payer: MEDICARE | Attending: Adult Health | Primary: Adult Health

## 2022-04-24 ENCOUNTER — Encounter: Admit: 2022-04-24 | Discharge: 2022-04-28 | Payer: MEDICARE | Attending: Registered Nurse

## 2022-04-24 ENCOUNTER — Ambulatory Visit: Admit: 2022-04-24 | Discharge: 2022-04-28 | Payer: MEDICARE

## 2022-04-24 ENCOUNTER — Ambulatory Visit: Admit: 2022-04-24 | Discharge: 2022-04-28 | Disposition: A | Discharge status: Home Health | Payer: MEDICARE

## 2022-04-27 MED ORDER — TAMSULOSIN 0.4 MG CAPSULE
ORAL_CAPSULE | Freq: Every day | ORAL | 0 refills | 30 days
Start: 2022-04-27 — End: 2022-05-27

## 2022-04-27 MED ORDER — SENNOSIDES 8.6 MG TABLET
ORAL_TABLET | Freq: Every day | ORAL | 0 refills | 15 days
Start: 2022-04-27 — End: 2022-05-12

## 2022-04-27 MED ORDER — OXYCODONE 5 MG TABLET
ORAL_TABLET | ORAL | 0 refills | 1 days | PRN
Start: 2022-04-27 — End: ?

## 2022-04-28 DIAGNOSIS — N200 Calculus of kidney: Principal | ICD-10-CM

## 2022-04-28 DIAGNOSIS — E790 Hyperuricemia without signs of inflammatory arthritis and tophaceous disease: Principal | ICD-10-CM

## 2022-04-28 MED ORDER — LEVOFLOXACIN 500 MG TABLET
ORAL_TABLET | Freq: Once | ORAL | 0 refills | 1 days | Status: CP
Start: 2022-04-28 — End: 2022-04-29
  Filled 2022-04-28: qty 1, 1d supply, fill #0

## 2022-04-28 MED ORDER — PHENAZOPYRIDINE 200 MG TABLET
ORAL_TABLET | Freq: Three times a day (TID) | ORAL | 0 refills | 5 days | Status: CP | PRN
Start: 2022-04-28 — End: 2022-05-03
  Filled 2022-04-28: qty 15, 5d supply, fill #0

## 2022-04-28 MED ORDER — POLYETHYLENE GLYCOL 3350 17 GRAM/DOSE ORAL POWDER
Freq: Every day | ORAL | 0 refills | 28 days | Status: CP
Start: 2022-04-28 — End: 2022-05-28
  Filled 2022-04-28: qty 238, 14d supply, fill #0

## 2022-04-28 MED ORDER — OXYBUTYNIN CHLORIDE 5 MG TABLET
ORAL_TABLET | Freq: Three times a day (TID) | ORAL | 0 refills | 6 days | Status: CP | PRN
Start: 2022-04-28 — End: 2022-05-04
  Filled 2022-04-28: qty 18, 6d supply, fill #0

## 2022-04-28 MED ORDER — SENNOSIDES 8.6 MG TABLET
ORAL_TABLET | Freq: Every day | ORAL | 0 refills | 15 days | Status: CP
Start: 2022-04-28 — End: 2022-05-13
  Filled 2022-04-28: qty 15, 15d supply, fill #0

## 2022-04-28 MED ORDER — OXYCODONE 5 MG TABLET
ORAL_TABLET | ORAL | 0 refills | 1 days | Status: CP | PRN
Start: 2022-04-28 — End: 2022-04-28

## 2022-04-28 MED ORDER — LEVOCETIRIZINE 5 MG TABLET
ORAL_TABLET | Freq: Every evening | ORAL | 3 refills | 90 days | Status: CP
Start: 2022-04-28 — End: ?

## 2022-04-28 MED ORDER — ENALAPRIL MALEATE 5 MG TABLET
ORAL_TABLET | Freq: Every day | ORAL | 3 refills | 90 days | Status: CP
Start: 2022-04-28 — End: ?

## 2022-04-28 MED ORDER — FUROSEMIDE 40 MG TABLET
ORAL_TABLET | Freq: Every day | ORAL | 3 refills | 90 days | Status: CP
Start: 2022-04-28 — End: ?

## 2022-05-01 DIAGNOSIS — Z09 Encounter for follow-up examination after completed treatment for conditions other than malignant neoplasm: Principal | ICD-10-CM

## 2022-05-01 DIAGNOSIS — N200 Calculus of kidney: Principal | ICD-10-CM

## 2022-05-01 DIAGNOSIS — H9122 Sudden idiopathic hearing loss, left ear: Principal | ICD-10-CM

## 2022-05-01 MED ORDER — LEVOFLOXACIN 500 MG TABLET
ORAL_TABLET | Freq: Every day | ORAL | 0 refills | 1.00 days | Status: CP
Start: 2022-05-01 — End: ?

## 2022-05-02 ENCOUNTER — Ambulatory Visit: Admit: 2022-05-02 | Discharge: 2022-05-02 | Payer: MEDICARE

## 2022-05-02 ENCOUNTER — Institutional Professional Consult (permissible substitution): Admit: 2022-05-02 | Discharge: 2022-05-02 | Payer: MEDICARE

## 2022-05-02 DIAGNOSIS — R42 Dizziness and giddiness: Principal | ICD-10-CM

## 2022-05-02 DIAGNOSIS — H9122 Sudden idiopathic hearing loss, left ear: Principal | ICD-10-CM

## 2022-05-02 DIAGNOSIS — H918X3 Other specified hearing loss, bilateral: Principal | ICD-10-CM

## 2022-05-04 ENCOUNTER — Ambulatory Visit: Admit: 2022-05-04 | Payer: MEDICARE

## 2022-05-04 ENCOUNTER — Encounter: Admit: 2022-05-04 | Payer: MEDICARE

## 2022-05-04 ENCOUNTER — Encounter: Admit: 2022-05-04 | Payer: MEDICARE | Attending: Student in an Organized Health Care Education/Training Program

## 2022-05-04 ENCOUNTER — Ambulatory Visit
Admit: 2022-05-04 | Discharge: 2022-05-17 | Disposition: A | Discharge status: Home / Self Care | Payer: MEDICARE | Source: Other Acute Inpatient Hospital

## 2022-05-04 ENCOUNTER — Encounter
Admit: 2022-05-04 | Discharge: 2022-05-17 | Disposition: A | Discharge status: Home / Self Care | Payer: MEDICARE | Source: Other Acute Inpatient Hospital | Attending: Student in an Organized Health Care Education/Training Program

## 2022-05-04 ENCOUNTER — Ambulatory Visit: Admit: 2022-05-04 | Discharge: 2022-05-17 | Payer: MEDICARE

## 2022-05-17 MED ORDER — SENNOSIDES 8.6 MG TABLET
ORAL_TABLET | Freq: Every day | ORAL | 0 refills | 15 days | Status: CP
Start: 2022-05-17 — End: 2022-06-01
  Filled 2022-05-17: qty 15, 15d supply, fill #0

## 2022-05-17 MED ORDER — OXYCODONE 5 MG TABLET
ORAL_TABLET | ORAL | 0 refills | 1 days | Status: CP | PRN
Start: 2022-05-17 — End: ?
  Filled 2022-05-17: qty 5, 1d supply, fill #0

## 2022-05-18 DIAGNOSIS — Z09 Encounter for follow-up examination after completed treatment for conditions other than malignant neoplasm: Principal | ICD-10-CM

## 2022-05-22 DIAGNOSIS — N200 Calculus of kidney: Principal | ICD-10-CM

## 2022-05-22 MED ORDER — TAMSULOSIN 0.4 MG CAPSULE
ORAL_CAPSULE | Freq: Every day | ORAL | 1 refills | 0 days
Start: 2022-05-22 — End: ?

## 2022-05-23 MED ORDER — TAMSULOSIN 0.4 MG CAPSULE
ORAL_CAPSULE | Freq: Every day | ORAL | 1 refills | 90 days | Status: CP
Start: 2022-05-23 — End: 2022-06-22

## 2022-05-29 ENCOUNTER — Ambulatory Visit: Admit: 2022-05-29 | Discharge: 2022-05-30 | Payer: MEDICARE

## 2022-05-29 DIAGNOSIS — I872 Venous insufficiency (chronic) (peripheral): Principal | ICD-10-CM

## 2022-05-29 DIAGNOSIS — I5032 Chronic diastolic (congestive) heart failure: Principal | ICD-10-CM

## 2022-05-29 DIAGNOSIS — I10 Essential (primary) hypertension: Principal | ICD-10-CM

## 2022-06-15 ENCOUNTER — Ambulatory Visit: Admit: 2022-06-15 | Discharge: 2022-06-15 | Payer: MEDICARE | Attending: Medical | Primary: Medical

## 2022-06-15 ENCOUNTER — Ambulatory Visit: Admit: 2022-06-15 | Discharge: 2022-06-15 | Payer: MEDICARE

## 2022-06-15 DIAGNOSIS — N200 Calculus of kidney: Principal | ICD-10-CM

## 2022-06-29 ENCOUNTER — Ambulatory Visit: Admit: 2022-06-29 | Discharge: 2022-06-30 | Payer: MEDICARE

## 2022-06-29 DIAGNOSIS — E611 Iron deficiency: Principal | ICD-10-CM

## 2022-06-29 DIAGNOSIS — I5032 Chronic diastolic (congestive) heart failure: Principal | ICD-10-CM

## 2022-06-29 DIAGNOSIS — R1310 Dysphagia, unspecified: Principal | ICD-10-CM

## 2022-06-29 DIAGNOSIS — N1831 Stage 3a chronic kidney disease (CMS-HCC): Principal | ICD-10-CM

## 2022-06-29 DIAGNOSIS — Z87442 Personal history of urinary calculi: Principal | ICD-10-CM

## 2022-06-29 DIAGNOSIS — I10 Essential (primary) hypertension: Principal | ICD-10-CM

## 2022-06-29 DIAGNOSIS — Z1211 Encounter for screening for malignant neoplasm of colon: Principal | ICD-10-CM

## 2022-06-29 MED ORDER — OMEPRAZOLE 40 MG CAPSULE,DELAYED RELEASE
ORAL_CAPSULE | Freq: Every day | ORAL | 3 refills | 90 days | Status: CP | PRN
Start: 2022-06-29 — End: 2023-06-29

## 2022-06-29 MED ORDER — SERTRALINE 25 MG TABLET
ORAL_TABLET | Freq: Every day | ORAL | 3 refills | 90 days | Status: CP
Start: 2022-06-29 — End: 2023-06-29

## 2022-07-01 DIAGNOSIS — N1831 Stage 3a chronic kidney disease (CMS-HCC): Principal | ICD-10-CM

## 2022-07-01 DIAGNOSIS — D509 Iron deficiency anemia, unspecified: Principal | ICD-10-CM

## 2022-07-01 DIAGNOSIS — N179 Acute kidney failure, unspecified: Principal | ICD-10-CM

## 2022-07-12 ENCOUNTER — Ambulatory Visit: Admit: 2022-07-12 | Discharge: 2022-07-13 | Payer: MEDICARE

## 2022-07-12 ENCOUNTER — Ambulatory Visit: Admit: 2022-07-12 | Discharge: 2022-07-13 | Payer: MEDICARE | Attending: Internal Medicine | Primary: Internal Medicine

## 2022-07-12 DIAGNOSIS — M5431 Sciatica, right side: Principal | ICD-10-CM

## 2022-07-14 ENCOUNTER — Ambulatory Visit: Admit: 2022-07-14 | Discharge: 2022-07-15 | Payer: MEDICARE

## 2022-07-14 DIAGNOSIS — N3000 Acute cystitis without hematuria: Principal | ICD-10-CM

## 2022-07-14 DIAGNOSIS — D509 Iron deficiency anemia, unspecified: Principal | ICD-10-CM

## 2022-07-14 DIAGNOSIS — I10 Essential (primary) hypertension: Principal | ICD-10-CM

## 2022-07-14 MED ORDER — AMOXICILLIN 875 MG-POTASSIUM CLAVULANATE 125 MG TABLET
ORAL_TABLET | Freq: Two times a day (BID) | ORAL | 0 refills | 5 days | Status: CP
Start: 2022-07-14 — End: 2022-07-19

## 2022-07-14 MED ORDER — SPIRONOLACTONE 25 MG TABLET
ORAL_TABLET | Freq: Every day | ORAL | 3 refills | 90 days | Status: CP
Start: 2022-07-14 — End: ?

## 2022-08-02 ENCOUNTER — Institutional Professional Consult (permissible substitution): Admit: 2022-08-02 | Discharge: 2022-08-02 | Payer: MEDICARE | Attending: Audiologist | Primary: Audiologist

## 2022-08-02 ENCOUNTER — Ambulatory Visit: Admit: 2022-08-02 | Discharge: 2022-08-02 | Payer: MEDICARE

## 2022-08-02 DIAGNOSIS — H9122 Sudden idiopathic hearing loss, left ear: Principal | ICD-10-CM

## 2022-08-04 ENCOUNTER — Ambulatory Visit
Admit: 2022-08-04 | Discharge: 2022-08-05 | Payer: MEDICARE | Attending: Physical Medicine & Rehabilitation | Primary: Physical Medicine & Rehabilitation

## 2022-08-04 DIAGNOSIS — M5416 Radiculopathy, lumbar region: Principal | ICD-10-CM

## 2022-08-07 ENCOUNTER — Ambulatory Visit: Admit: 2022-08-07 | Payer: MEDICARE

## 2022-08-07 ENCOUNTER — Institutional Professional Consult (permissible substitution): Admit: 2022-08-07 | Payer: MEDICARE

## 2022-08-07 DIAGNOSIS — M5431 Sciatica, right side: Principal | ICD-10-CM

## 2022-08-08 DIAGNOSIS — R42 Dizziness and giddiness: Principal | ICD-10-CM

## 2022-08-23 DIAGNOSIS — M5431 Sciatica, right side: Principal | ICD-10-CM

## 2022-08-30 DIAGNOSIS — M5431 Sciatica, right side: Principal | ICD-10-CM

## 2022-09-08 ENCOUNTER — Ambulatory Visit: Admit: 2022-09-08 | Discharge: 2022-09-09 | Payer: MEDICARE

## 2022-09-08 DIAGNOSIS — Z87442 Personal history of urinary calculi: Principal | ICD-10-CM

## 2022-09-08 DIAGNOSIS — N12 Tubulo-interstitial nephritis, not specified as acute or chronic: Principal | ICD-10-CM

## 2022-09-08 DIAGNOSIS — K921 Melena: Principal | ICD-10-CM

## 2022-09-08 DIAGNOSIS — R1012 Left upper quadrant pain: Principal | ICD-10-CM

## 2022-09-08 MED ORDER — SULFAMETHOXAZOLE 800 MG-TRIMETHOPRIM 160 MG TABLET
ORAL_TABLET | Freq: Two times a day (BID) | ORAL | 0 refills | 14 days | Status: CP
Start: 2022-09-08 — End: 2022-09-22

## 2022-09-08 MED ORDER — CIPROFLOXACIN 500 MG TABLET
ORAL_TABLET | Freq: Two times a day (BID) | ORAL | 0 refills | 4 days | Status: CP
Start: 2022-09-08 — End: 2022-09-08

## 2022-09-08 MED ORDER — PEG 3350-ELECTROLYTES 236 GRAM-22.74 GRAM-6.74 GRAM-5.86 GRAM SOLUTION
0 refills | 0 days | Status: CP
Start: 2022-09-08 — End: ?

## 2022-09-12 DIAGNOSIS — R1012 Left upper quadrant pain: Principal | ICD-10-CM

## 2022-09-12 DIAGNOSIS — N12 Tubulo-interstitial nephritis, not specified as acute or chronic: Principal | ICD-10-CM

## 2022-09-13 ENCOUNTER — Ambulatory Visit: Admit: 2022-09-13 | Payer: MEDICARE

## 2022-09-25 ENCOUNTER — Ambulatory Visit: Admit: 2022-09-25 | Discharge: 2022-09-26 | Payer: MEDICARE

## 2022-09-25 DIAGNOSIS — Z1231 Encounter for screening mammogram for malignant neoplasm of breast: Principal | ICD-10-CM

## 2022-09-25 DIAGNOSIS — E7800 Pure hypercholesterolemia, unspecified: Principal | ICD-10-CM

## 2022-09-25 DIAGNOSIS — N1831 Stage 3a chronic kidney disease (CMS-HCC): Principal | ICD-10-CM

## 2022-09-25 DIAGNOSIS — N390 Urinary tract infection, site not specified: Principal | ICD-10-CM

## 2022-09-25 MED ORDER — LOVASTATIN 40 MG TABLET
ORAL_TABLET | Freq: Every day | ORAL | 3 refills | 90 days | Status: CP
Start: 2022-09-25 — End: 2023-09-25

## 2022-09-25 MED ORDER — CONJUGATED ESTROGENS 0.625 MG/GRAM VAGINAL CREAM
VAGINAL | 1 refills | 177 days | Status: CP
Start: 2022-09-25 — End: 2023-10-09

## 2022-10-04 ENCOUNTER — Ambulatory Visit: Admit: 2022-10-04 | Discharge: 2022-10-05 | Payer: MEDICARE

## 2022-10-04 DIAGNOSIS — M5431 Sciatica, right side: Principal | ICD-10-CM

## 2022-10-04 DIAGNOSIS — M5416 Radiculopathy, lumbar region: Principal | ICD-10-CM

## 2022-10-06 ENCOUNTER — Ambulatory Visit: Admit: 2022-10-06 | Discharge: 2022-10-07 | Payer: MEDICARE

## 2022-10-16 DIAGNOSIS — N390 Urinary tract infection, site not specified: Principal | ICD-10-CM

## 2022-10-16 MED ORDER — CONJUGATED ESTROGENS 0.625 MG/GRAM VAGINAL CREAM
VAGINAL | 1 refills | 177 days
Start: 2022-10-16 — End: 2023-10-29

## 2022-10-25 MED ORDER — OMEPRAZOLE 40 MG CAPSULE,DELAYED RELEASE
ORAL_CAPSULE | Freq: Every day | ORAL | 3 refills | 90 days
Start: 2022-10-25 — End: ?

## 2022-10-26 ENCOUNTER — Institutional Professional Consult (permissible substitution): Admit: 2022-10-26 | Payer: MEDICARE

## 2022-10-26 DIAGNOSIS — M5431 Sciatica, right side: Principal | ICD-10-CM

## 2022-10-26 DIAGNOSIS — R2689 Other abnormalities of gait and mobility: Principal | ICD-10-CM

## 2022-10-26 DIAGNOSIS — R42 Dizziness and giddiness: Principal | ICD-10-CM

## 2022-10-26 DIAGNOSIS — M6281 Muscle weakness (generalized): Principal | ICD-10-CM

## 2022-10-26 DIAGNOSIS — Z9181 History of falling: Principal | ICD-10-CM

## 2022-10-30 DIAGNOSIS — N390 Urinary tract infection, site not specified: Principal | ICD-10-CM

## 2022-10-30 MED ORDER — CONJUGATED ESTROGENS 0.625 MG/GRAM VAGINAL CREAM
VAGINAL | 1 refills | 210 days | Status: CP
Start: 2022-10-30 — End: 2023-10-30

## 2022-11-08 ENCOUNTER — Other Ambulatory Visit: Admit: 2022-11-08 | Discharge: 2022-11-08 | Payer: MEDICARE

## 2022-11-08 ENCOUNTER — Ambulatory Visit: Admit: 2022-11-08 | Discharge: 2022-11-08 | Payer: MEDICARE | Attending: Adult Health | Primary: Adult Health

## 2022-11-08 ENCOUNTER — Ambulatory Visit: Admit: 2022-11-08 | Discharge: 2022-11-08 | Payer: MEDICARE

## 2022-11-08 DIAGNOSIS — N390 Urinary tract infection, site not specified: Principal | ICD-10-CM

## 2022-11-08 DIAGNOSIS — R0609 Other forms of dyspnea: Principal | ICD-10-CM

## 2022-11-08 DIAGNOSIS — D7581 Myelofibrosis: Principal | ICD-10-CM

## 2022-11-08 MED ORDER — SULFAMETHOXAZOLE 800 MG-TRIMETHOPRIM 160 MG TABLET
ORAL_TABLET | Freq: Two times a day (BID) | ORAL | 0 refills | 14 days | Status: CP
Start: 2022-11-08 — End: 2022-11-08

## 2022-11-15 ENCOUNTER — Ambulatory Visit: Admit: 2022-11-15 | Payer: MEDICARE

## 2022-11-15 ENCOUNTER — Ambulatory Visit: Admit: 2022-11-15 | Discharge: 2022-12-04 | Payer: MEDICARE

## 2022-11-15 DIAGNOSIS — M6281 Muscle weakness (generalized): Principal | ICD-10-CM

## 2022-11-15 DIAGNOSIS — Z9181 History of falling: Principal | ICD-10-CM

## 2022-11-15 DIAGNOSIS — R42 Dizziness and giddiness: Principal | ICD-10-CM

## 2022-11-15 DIAGNOSIS — M5431 Sciatica, right side: Principal | ICD-10-CM

## 2022-11-15 DIAGNOSIS — R2689 Other abnormalities of gait and mobility: Principal | ICD-10-CM

## 2022-11-22 DIAGNOSIS — M6281 Muscle weakness (generalized): Principal | ICD-10-CM

## 2022-11-22 DIAGNOSIS — M5431 Sciatica, right side: Principal | ICD-10-CM

## 2022-11-22 DIAGNOSIS — R2689 Other abnormalities of gait and mobility: Principal | ICD-10-CM

## 2022-11-22 DIAGNOSIS — Z9181 History of falling: Principal | ICD-10-CM

## 2022-11-22 DIAGNOSIS — R42 Dizziness and giddiness: Principal | ICD-10-CM

## 2022-11-29 ENCOUNTER — Ambulatory Visit: Admit: 2022-11-29 | Discharge: 2022-11-29 | Payer: MEDICARE

## 2022-11-29 ENCOUNTER — Other Ambulatory Visit: Admit: 2022-11-29 | Discharge: 2022-11-29 | Payer: MEDICARE

## 2022-11-29 DIAGNOSIS — R300 Dysuria: Principal | ICD-10-CM

## 2022-11-29 DIAGNOSIS — R1013 Epigastric pain: Principal | ICD-10-CM

## 2022-11-29 DIAGNOSIS — K921 Melena: Principal | ICD-10-CM

## 2022-11-29 DIAGNOSIS — J45909 Unspecified asthma, uncomplicated: Principal | ICD-10-CM

## 2022-11-29 MED ORDER — ALBUTEROL SULFATE HFA 90 MCG/ACTUATION AEROSOL INHALER
Freq: Four times a day (QID) | RESPIRATORY_TRACT | 1 refills | 0 days | Status: CP | PRN
Start: 2022-11-29 — End: ?

## 2022-11-29 MED ORDER — DOXYCYCLINE HYCLATE 100 MG TABLET
ORAL_TABLET | Freq: Two times a day (BID) | ORAL | 0 refills | 7 days | Status: CP
Start: 2022-11-29 — End: 2022-12-06

## 2022-11-30 ENCOUNTER — Ambulatory Visit: Admit: 2022-11-30 | Discharge: 2022-12-01 | Payer: MEDICARE

## 2022-11-30 DIAGNOSIS — K8050 Calculus of bile duct without cholangitis or cholecystitis without obstruction: Principal | ICD-10-CM

## 2022-11-30 DIAGNOSIS — R1013 Epigastric pain: Principal | ICD-10-CM

## 2022-12-07 ENCOUNTER — Ambulatory Visit: Admit: 2022-12-07 | Discharge: 2022-12-08 | Payer: MEDICARE

## 2022-12-07 ENCOUNTER — Ambulatory Visit
Admit: 2022-12-07 | Discharge: 2022-12-08 | Payer: MEDICARE | Attending: Student in an Organized Health Care Education/Training Program | Primary: Student in an Organized Health Care Education/Training Program

## 2022-12-07 DIAGNOSIS — K8050 Calculus of bile duct without cholangitis or cholecystitis without obstruction: Principal | ICD-10-CM

## 2022-12-08 ENCOUNTER — Ambulatory Visit: Admit: 2022-12-08 | Discharge: 2022-12-09 | Payer: MEDICARE

## 2022-12-18 ENCOUNTER — Ambulatory Visit: Admit: 2022-12-18 | Discharge: 2022-12-18 | Payer: MEDICARE

## 2022-12-18 DIAGNOSIS — N1831 Stage 3a chronic kidney disease (CMS-HCC): Principal | ICD-10-CM

## 2022-12-18 DIAGNOSIS — E611 Iron deficiency: Principal | ICD-10-CM

## 2022-12-18 DIAGNOSIS — F329 Major depressive disorder, single episode, unspecified: Principal | ICD-10-CM

## 2022-12-18 DIAGNOSIS — E538 Deficiency of other specified B group vitamins: Principal | ICD-10-CM

## 2022-12-18 DIAGNOSIS — G2581 Restless legs syndrome: Principal | ICD-10-CM

## 2022-12-18 DIAGNOSIS — I10 Essential (primary) hypertension: Principal | ICD-10-CM

## 2022-12-18 DIAGNOSIS — R5383 Other fatigue: Principal | ICD-10-CM

## 2022-12-18 DIAGNOSIS — E559 Vitamin D deficiency, unspecified: Principal | ICD-10-CM

## 2022-12-18 MED ORDER — SERTRALINE 50 MG TABLET
ORAL_TABLET | Freq: Every day | ORAL | 11 refills | 30 days | Status: CP
Start: 2022-12-18 — End: 2023-12-18

## 2022-12-18 MED ORDER — ROPINIROLE 0.5 MG TABLET
ORAL_TABLET | Freq: Every evening | ORAL | 11 refills | 30 days | Status: CP
Start: 2022-12-18 — End: 2023-12-18

## 2022-12-27 ENCOUNTER — Ambulatory Visit: Admit: 2022-12-27 | Discharge: 2022-12-28 | Payer: MEDICARE | Attending: Medical | Primary: Medical

## 2022-12-27 DIAGNOSIS — Z87442 Personal history of urinary calculi: Principal | ICD-10-CM

## 2022-12-27 DIAGNOSIS — R109 Unspecified abdominal pain: Principal | ICD-10-CM

## 2022-12-27 MED ORDER — ONDANSETRON 4 MG DISINTEGRATING TABLET
ORAL_TABLET | Freq: Three times a day (TID) | 3 refills | 5 days | Status: CP | PRN
Start: 2022-12-27 — End: ?

## 2022-12-29 DIAGNOSIS — K8020 Calculus of gallbladder without cholecystitis without obstruction: Principal | ICD-10-CM

## 2022-12-29 DIAGNOSIS — D7581 Myelofibrosis: Principal | ICD-10-CM

## 2022-12-29 DIAGNOSIS — N3000 Acute cystitis without hematuria: Principal | ICD-10-CM

## 2022-12-29 DIAGNOSIS — R109 Unspecified abdominal pain: Principal | ICD-10-CM

## 2022-12-29 MED ORDER — DOXYCYCLINE HYCLATE 100 MG CAPSULE
ORAL_CAPSULE | Freq: Two times a day (BID) | ORAL | 0 refills | 10 days | Status: CP
Start: 2022-12-29 — End: 2023-01-08

## 2023-01-10 ENCOUNTER — Ambulatory Visit: Admit: 2023-01-10 | Discharge: 2023-01-11 | Payer: MEDICARE

## 2023-01-10 DIAGNOSIS — R109 Unspecified abdominal pain: Principal | ICD-10-CM

## 2023-01-14 ENCOUNTER — Emergency Department: Admit: 2023-01-14 | Discharge: 2023-01-15 | Disposition: A | Discharge status: Home / Self Care | Payer: MEDICARE

## 2023-01-14 ENCOUNTER — Encounter: Admit: 2023-01-14 | Discharge: 2023-01-14 | Payer: MEDICARE

## 2023-01-14 ENCOUNTER — Ambulatory Visit: Admit: 2023-01-14 | Discharge: 2023-01-15 | Disposition: A | Discharge status: Home / Self Care | Payer: MEDICARE

## 2023-01-14 DIAGNOSIS — K5900 Constipation, unspecified: Principal | ICD-10-CM

## 2023-01-14 DIAGNOSIS — D72829 Elevated white blood cell count, unspecified: Principal | ICD-10-CM

## 2023-01-14 DIAGNOSIS — D696 Thrombocytopenia, unspecified: Principal | ICD-10-CM

## 2023-01-14 DIAGNOSIS — R109 Unspecified abdominal pain: Principal | ICD-10-CM

## 2023-01-14 MED ORDER — SENNOSIDES 8.6 MG-DOCUSATE SODIUM 50 MG TABLET
ORAL_TABLET | Freq: Every day | ORAL | 0 refills | 20 days | Status: CP
Start: 2023-01-14 — End: 2023-02-03

## 2023-01-14 MED ORDER — MINERAL OIL ENEMA
Freq: Once | RECTAL | 0 refills | 1 days | Status: CP
Start: 2023-01-14 — End: 2023-01-14

## 2023-01-15 ENCOUNTER — Institutional Professional Consult (permissible substitution): Admit: 2023-01-15 | Discharge: 2023-01-16 | Payer: MEDICARE

## 2023-01-15 DIAGNOSIS — F329 Major depressive disorder, single episode, unspecified: Principal | ICD-10-CM

## 2023-01-15 DIAGNOSIS — J45909 Unspecified asthma, uncomplicated: Principal | ICD-10-CM

## 2023-01-15 MED ORDER — ALBUTEROL SULFATE HFA 90 MCG/ACTUATION AEROSOL INHALER
Freq: Four times a day (QID) | RESPIRATORY_TRACT | 3 refills | 0.00 days | Status: CP | PRN
Start: 2023-01-15 — End: 2024-01-15

## 2023-01-15 MED ORDER — SERTRALINE 25 MG TABLET
ORAL_TABLET | Freq: Every day | ORAL | 3 refills | 90.00 days | Status: CP
Start: 2023-01-15 — End: 2024-01-15

## 2023-01-15 MED ORDER — SERTRALINE 50 MG TABLET
ORAL_TABLET | Freq: Every day | ORAL | 4 refills | 0.00 days
Start: 2023-01-15 — End: ?

## 2023-01-16 DIAGNOSIS — D7581 Myelofibrosis: Principal | ICD-10-CM

## 2023-01-23 ENCOUNTER — Encounter: Admit: 2023-01-23 | Discharge: 2023-01-24 | Payer: MEDICARE

## 2023-01-24 ENCOUNTER — Ambulatory Visit: Admit: 2023-01-24 | Discharge: 2023-01-24 | Payer: MEDICARE | Attending: Adult Health | Primary: Adult Health

## 2023-01-24 ENCOUNTER — Other Ambulatory Visit: Admit: 2023-01-24 | Discharge: 2023-01-24 | Payer: MEDICARE

## 2023-01-24 DIAGNOSIS — D7581 Myelofibrosis: Principal | ICD-10-CM

## 2023-01-25 DIAGNOSIS — D7581 Myelofibrosis: Principal | ICD-10-CM

## 2023-01-29 DIAGNOSIS — D7581 Myelofibrosis: Principal | ICD-10-CM

## 2023-01-30 DIAGNOSIS — D7581 Myelofibrosis: Principal | ICD-10-CM

## 2023-02-13 DIAGNOSIS — D7581 Myelofibrosis: Principal | ICD-10-CM

## 2023-02-14 ENCOUNTER — Ambulatory Visit: Admit: 2023-02-14 | Discharge: 2023-02-14 | Payer: MEDICARE | Attending: Medical | Primary: Medical

## 2023-02-14 DIAGNOSIS — Z87442 Personal history of urinary calculi: Principal | ICD-10-CM

## 2023-02-14 DIAGNOSIS — R109 Unspecified abdominal pain: Principal | ICD-10-CM

## 2023-02-14 DIAGNOSIS — N390 Urinary tract infection, site not specified: Principal | ICD-10-CM

## 2023-02-14 MED ORDER — DOXYCYCLINE HYCLATE 100 MG CAPSULE
ORAL_CAPSULE | Freq: Every day | ORAL | 0 refills | 90.00 days | Status: CP
Start: 2023-02-14 — End: 2023-05-15

## 2023-02-15 ENCOUNTER — Ambulatory Visit: Admit: 2023-02-15 | Discharge: 2023-02-16 | Payer: MEDICARE

## 2023-02-15 DIAGNOSIS — N390 Urinary tract infection, site not specified: Principal | ICD-10-CM

## 2023-02-15 DIAGNOSIS — R0609 Other forms of dyspnea: Principal | ICD-10-CM

## 2023-02-15 DIAGNOSIS — R7303 Prediabetes: Principal | ICD-10-CM

## 2023-02-15 DIAGNOSIS — R109 Unspecified abdominal pain: Principal | ICD-10-CM

## 2023-02-15 DIAGNOSIS — R079 Chest pain, unspecified: Principal | ICD-10-CM

## 2023-02-15 DIAGNOSIS — N1831 Stage 3a chronic kidney disease (CMS-HCC): Principal | ICD-10-CM

## 2023-02-15 DIAGNOSIS — R070 Pain in throat: Principal | ICD-10-CM

## 2023-02-16 DIAGNOSIS — D7581 Myelofibrosis: Principal | ICD-10-CM

## 2023-02-18 MED ORDER — FUROSEMIDE 40 MG TABLET
ORAL_TABLET | Freq: Every day | ORAL | 3 refills | 0.00 days
Start: 2023-02-18 — End: ?

## 2023-02-18 MED ORDER — ENALAPRIL MALEATE 5 MG TABLET
ORAL_TABLET | Freq: Every day | ORAL | 3 refills | 0.00 days
Start: 2023-02-18 — End: ?

## 2023-02-18 MED ORDER — LEVOCETIRIZINE 5 MG TABLET
ORAL_TABLET | Freq: Every evening | ORAL | 3 refills | 0.00 days
Start: 2023-02-18 — End: ?

## 2023-02-21 ENCOUNTER — Encounter: Admit: 2023-02-21 | Discharge: 2023-02-22 | Payer: MEDICARE | Attending: Adult Health | Primary: Adult Health

## 2023-02-21 ENCOUNTER — Other Ambulatory Visit: Admit: 2023-02-21 | Discharge: 2023-02-22 | Payer: MEDICARE

## 2023-02-21 ENCOUNTER — Inpatient Hospital Stay
Admit: 2023-02-21 | Discharge: 2023-02-22 | Payer: MEDICARE | Attending: Student in an Organized Health Care Education/Training Program | Primary: Student in an Organized Health Care Education/Training Program

## 2023-02-21 DIAGNOSIS — D7581 Myelofibrosis: Principal | ICD-10-CM

## 2023-02-21 DIAGNOSIS — D696 Thrombocytopenia, unspecified: Principal | ICD-10-CM

## 2023-02-21 MED ORDER — ENALAPRIL MALEATE 5 MG TABLET
ORAL_TABLET | Freq: Every day | ORAL | 3 refills | 90.00 days | Status: CP
Start: 2023-02-21 — End: ?

## 2023-02-21 MED ORDER — LEVOCETIRIZINE 5 MG TABLET
ORAL_TABLET | Freq: Every evening | ORAL | 3 refills | 90.00 days | Status: CP
Start: 2023-02-21 — End: 2024-02-15

## 2023-02-21 MED ORDER — FUROSEMIDE 40 MG TABLET
ORAL_TABLET | Freq: Every day | ORAL | 3 refills | 90.00 days | Status: CP
Start: 2023-02-21 — End: 2024-02-15

## 2023-02-22 DIAGNOSIS — D7581 Myelofibrosis: Principal | ICD-10-CM

## 2023-03-01 DIAGNOSIS — D7581 Myelofibrosis: Principal | ICD-10-CM

## 2023-03-08 ENCOUNTER — Inpatient Hospital Stay: Admit: 2023-03-08 | Discharge: 2023-03-09 | Payer: MEDICARE

## 2023-03-09 DIAGNOSIS — D7581 Myelofibrosis: Principal | ICD-10-CM

## 2023-03-10 DIAGNOSIS — D7581 Myelofibrosis: Principal | ICD-10-CM

## 2023-03-10 DIAGNOSIS — M899 Disorder of bone, unspecified: Principal | ICD-10-CM

## 2023-03-10 DIAGNOSIS — E611 Iron deficiency: Principal | ICD-10-CM

## 2023-03-12 ENCOUNTER — Other Ambulatory Visit: Admit: 2023-03-12 | Discharge: 2023-03-13 | Payer: MEDICARE

## 2023-03-12 ENCOUNTER — Ambulatory Visit: Admit: 2023-03-12 | Discharge: 2023-03-13 | Payer: MEDICARE | Attending: Internal Medicine | Primary: Internal Medicine

## 2023-03-12 DIAGNOSIS — M899 Disorder of bone, unspecified: Principal | ICD-10-CM

## 2023-03-12 DIAGNOSIS — D696 Thrombocytopenia, unspecified: Principal | ICD-10-CM

## 2023-03-12 DIAGNOSIS — D7581 Myelofibrosis: Principal | ICD-10-CM

## 2023-03-12 DIAGNOSIS — I5030 Unspecified diastolic (congestive) heart failure: Principal | ICD-10-CM

## 2023-03-12 DIAGNOSIS — E611 Iron deficiency: Principal | ICD-10-CM

## 2023-03-12 DIAGNOSIS — N1832 CKD stage 3b, GFR 30-44 ml/min (CMS-HCC): Principal | ICD-10-CM

## 2023-03-12 DIAGNOSIS — R161 Splenomegaly, not elsewhere classified: Principal | ICD-10-CM

## 2023-03-12 DIAGNOSIS — C946 Myelodysplastic disease, not classified: Principal | ICD-10-CM

## 2023-03-12 MED ORDER — MOMELOTINIB 100 MG TABLET
ORAL_TABLET | Freq: Every day | ORAL | 2 refills | 30.00 days | Status: CP
Start: 2023-03-12 — End: ?
  Filled 2023-03-28: qty 30, 30d supply, fill #0

## 2023-03-13 DIAGNOSIS — D7581 Myelofibrosis: Principal | ICD-10-CM

## 2023-03-14 DIAGNOSIS — D7581 Myelofibrosis: Principal | ICD-10-CM

## 2023-03-14 DIAGNOSIS — D509 Iron deficiency anemia, unspecified: Principal | ICD-10-CM

## 2023-03-19 ENCOUNTER — Ambulatory Visit: Admit: 2023-03-19 | Discharge: 2023-03-20 | Payer: MEDICARE

## 2023-03-19 DIAGNOSIS — G8929 Other chronic pain: Principal | ICD-10-CM

## 2023-03-19 DIAGNOSIS — R1012 Left upper quadrant pain: Principal | ICD-10-CM

## 2023-03-19 MED ORDER — OXYCODONE 10 MG TABLET
ORAL_TABLET | ORAL | 0 refills | 4.00 days | Status: CP | PRN
Start: 2023-03-19 — End: ?

## 2023-03-26 DIAGNOSIS — D7581 Myelofibrosis: Principal | ICD-10-CM

## 2023-03-26 NOTE — Unmapped (Signed)
 [facility] Specialty Medication Onboarding    Specialty Medication: OJJAARA 100 mg tablet (momelotinib)  Prior Authorization: Approved   Financial Assistance: No - copay  <$25  Final Copay/Day Supply: $12.15 / 30 days     Insurance Restrictions: None     Notes to Pharmacist: n/a   Credit Card on File: no  Start Date on Rx:  03/12/23    The triage team has completed the benefits investigation and has determined that the patient is able to fill this medication at [facility]. Please contact the patient to complete the onboarding or follow up with the prescribing physician as needed.

## 2023-03-26 NOTE — Unmapped (Signed)
 [facility]    Patient Onboarding/Medication Counseling    Emily Walter is a 73 y.o. female with Myelofibrosis who I am counseling today on initiation of therapy.  I am speaking to the patient.    Was a Nurse, learning disability used for this call? No    Verified patient's date of birth / HIPAA.    Specialty medication(s) to be sent: Hematology/Oncology: Ojjaara      Non-specialty medications/supplies to be sent: n/a      Medications not needed at this time: n/a       The patient declined counseling on medication administration, missed dose instructions, goals of therapy, side effects and monitoring parameters, warnings and precautions, drug/food interactions, and storage, handling precautions, and disposal because they were counseled in clinic. The information in the declined sections below are for informational purposes only and was not discussed with patient.     Ojjaara (momelotinib)    Medication & Administration     Dosage:   Myelofibrosis: Take 1 tablet (200 mg) once daily until disease progression or unacceptable toxicity     Administration:   Take with or without food  Swallow tablets whole; do not cut, crush or chew  Adherence/Missed dose instructions:  Skip the missed dose and go back to your normal time  Do not take 2 doses at the same time or extra doses    Goals of Therapy   Treatment of intermediate or high-risk myelofibrosis     Side Effects & Monitoring Parameters   List common side effects:  Cardiovascular: Hypotension (14%), peripheral edema (11%)  Dermatologic: Pruritus (11%), skin rash (6% to 12%)  Gastrointestinal: Abdominal pain (13% to 18%), diarrhea (20% to 22%) nausea (16% to 20%)  Nervous system: Dizziness (8% to 24%), fatigue (21% to 22%), headache (11%)  Neuromuscular & skeletal: Limb pain (12%)  Respiratory: Cough (8% to 14%)  Miscellaneous: Fever (10% to 12%)    The following side effects should be reported to the provider:  Signs of an allergic reaction  Signs of an infection: Infection (38%; including bacterial infection [15% to 21%], fungal infection [excluding opportunistic infections: <5%], kidney infection (UTI) [<=12%], serious infection [13%], viral infection [6% to 12%])  Signs of unusual bleeding or bruising (Hemorrhage (21% to 22%), thrombocytopenia (21% to 28%)  Signs of kidney problems (Unable to pass urine, change in amount of urine passed, blood in urine, significant weight gain in short period of time)  Signs of liver problems (dark urine, light colored stool, yellowing of skin or eyes) (Increase in LFTs (23%) and serum bilirubin (16%)  Abnormal heartbeat  Severe dizziness or passing out  Burning, numbness or tingling feeling that is not normal  Call your doctor right away if you have signs of a blood clot like chest, arm, back, neck, or jaw pain or pressure; coughing up blood; numbness or weakness on 1 side of your body; trouble speaking or thinking; change in balance; change in eyesight; shortness of breath; or swelling, warmth, or pain in the leg or arm.    Monitoring Parameters:  CBC with differential (baseline, periodically during treatment, and as clinically indicated)  Hepatic function tests (baseline, every month for 6 months during treatment, then periodically as clinically indicated).   HBV monitoring for signs/symptoms of hepatitis B infection (including reactivation).    Contraindications, Warnings, & Precautions   Cardiac effects: Consider individual patient benefits versus cardiovascular risks prior to initiating or continuing momelotinib, particularly in patients with current (or history of) smoking or  with other cardiovascular risk factors.   Hematologic toxicity: thrombocytopenia and neutropenia;   Hepatotoxicity: Cases of reversible drug-induced liver injury.. The median time to onset of any grade transaminase elevation was 2 months, with most cases occurring within 4 months.  Infections: Serious infections (eg, bacterial and viral, including COVID-19)   Secondary malignancies: An increased risk of lymphoma and other malignancies (excluding nonmelanoma skin cancer) has been demonstrated with another JAK inhibitor when used for the treatment of rheumatoid arthritis (not an approved use for momelotinib). Smoking (current or past history) increased the risk for malignancy.   Thrombosis: Deep venous thrombosis, pulmonary embolism, and arterial thrombosis, was increased with another JAK inhibitor when used for the treatment of rheumatoid arthritis (not an approved use for momelotinib).    Pregnancy and Lactation   Patients who could become pregnant should use highly effective contraception during therapy and for at least 1 week after the last momelotinib dose.   In utero exposure to momelotinib may cause fetal harm.   Breastfeeding is not recommended by the manufacturer during therapy and for at least 1 week after the last momelotinib dose.    Drug/Food Interactions     Medication list reviewed in Epic. The patient was instructed to inform the care team before taking any new medications or supplements. No drug interactions identified.   Vaccines (Inactivated/Non-Replicating): Immunosuppressants (Miscellaneous Oncologic Agents) may diminish the therapeutic effect of Vaccines (Inactivated/Non-Replicating). Management: Give inactivated vaccines at least 2 weeks prior to initiation of immunosuppressants when possible. Patients vaccinated less than 14 days before initiating or during therapy should be revaccinated at least 3 after therapy is complete  Vaccines (Live): Immunosuppressants (Miscellaneous Oncologic Agents) may enhance the adverse/toxic effect of Vaccines (Live). Specifically, the risk of vaccine-associated infection may be increased. Immunosuppressants (Miscellaneous Oncologic Agents) may diminish the therapeutic effect of Vaccines     Storage, Handling Precautions, & Disposal   Store medication at room temperature  Dispense in the original bottle to protect from moisture. Do not discard dessicant. Do not eat or swallow dessicant  Keep all drugs out of the reach of children and pets  Throw away unused or expired medications.  Do not flush down a toilet or pour down the drain        Current Medications (including OTC/herbals), Comorbidities and Allergies     Current Outpatient Medications   Medication Sig Dispense Refill    albuterol HFA 90 mcg/actuation inhaler Inhale 2 puffs every six (6) hours as needed for wheezing or shortness of breath. 8 g 3    cholecalciferol, vitamin D3-50 mcg, 2,000 unit,, 50 mcg (2,000 unit) tablet Take 1 tablet (50 mcg total) by mouth in the morning.      conjugated estrogens (PREMARIN) 0.625 mg/gram vaginal cream Insert 0.5 g into the vagina Two (2) times a week. 30 g 1    diclofenac sodium (VOLTAREN) 1 % gel Apply 4 g topically 4 (four) times a day as needed for arthritis or pain (Knee pain). 100 g 2    doxycycline (VIBRAMYCIN) 100 MG capsule Take 1 capsule (100 mg total) by mouth daily. 90 capsule 0    enalapril (VASOTEC) 5 MG tablet TAKE 1 TABLET EVERY DAY 90 tablet 3    fluticasone propionate (FLONASE) 50 mcg/actuation nasal spray 2 sprays into each nostril daily as needed for allergies.      furosemide (LASIX) 40 MG tablet Take 1 tablet (40 mg total) by mouth daily. 90 tablet 3    levocetirizine (XYZAL) 5 MG tablet  Take 1 tablet (5 mg total) by mouth every evening. 90 tablet 3    lidocaine (LIDODERM) 5 % patch Place 1 patch on the skin daily. Apply to affected area for 12 hours only each day (then remove patch)      lovastatin (MEVACOR) 40 MG tablet Take 1 tablet (40 mg total) by mouth daily. 90 tablet 3    miscellaneous medical supply Misc Rollator for daily use with ambulation dx: M48.061      momelotinib (OJJAARA) 100 mg tablet Take 1 tablet (100 mg total) by mouth daily. Swallow tablets whole; do not cut, crush, or chew. 30 tablet 2    NARCAN 4 mg/actuation nasal spray 1 spray into alternating nostrils once as needed.  0 omeprazole (PRILOSEC) 40 MG capsule Take 1 capsule (40 mg total) by mouth daily as needed (reflux/heartburn). 90 capsule 3    ondansetron (ZOFRAN-ODT) 4 MG disintegrating tablet Dissolve 1 tablet (4 mg total) in the mouth every eight (8) hours as needed for nausea. 15 tablet 3    oxyCODONE (ROXICODONE) 10 mg immediate release tablet Take 1 tablet (10 mg total) by mouth every four (4) hours as needed for pain. 20 tablet 0    oxyCODONE-acetaminophen (PERCOCET) 10-325 mg per tablet TAKE 1 TABLET BY MOUTH THREE TIMES A DAY AS NEEDED FOR PAIN      polyethylene glycol (GOLYTELY) 236-22.74-6.74 gram solution Take by mouth as directed per [facility] GI prep instructions, for split bowel prep. 4000 mL 0    rOPINIRole (REQUIP) 0.5 MG tablet Take 1 tablet (0.5 mg total) by mouth nightly. 30 tablet 11    sertraline (ZOLOFT) 25 MG tablet Take 1 tablet (25 mg total) by mouth daily. 90 tablet 3    spironolactone (ALDACTONE) 25 MG tablet TAKE 1 TABLET EVERY DAY 90 tablet 3    ULTRA-LIGHT ROLLATOR Misc Use as directed.  0     No current facility-administered medications for this visit.       Allergies   Allergen Reactions    Fentanyl Anaphylaxis    Codeine Other (See Comments)     Headache, nausea    Corticosteroids (Glucocorticoids) Headache     **ALL STEROIDS**    Gabapentin Other (See Comments)     oversedation    Prednisone Headache    Pregabalin Rash     Swelling and general fatigue       Patient Active Problem List   Diagnosis    White coat syndrome with diagnosis of hypertension    Mixed urge and stress incontinence    Restless leg syndrome    Depression    Myelofibrosis (CMS-HCC)    GERD (gastroesophageal reflux disease)    History of nephrolithiasis    Stage 3a chronic kidney disease (CMS-HCC)    Lumbar stenosis with neurogenic claudication    Osteoarthritis    Thrombocytopenia (CMS-HCC)    Hyperlipidemia    Severe obstructive sleep apnea    Chronic heart failure with preserved ejection fraction (CMS-HCC)    Prediabetes    Iron deficiency anemia    N&V (nausea and vomiting)    Flank pain    Gallstones    Recurrent UTI    MDS/MPN (myelodysplastic/myeloproliferative neoplasms) (CMS-HCC)       Medication list has been reviewed and updated in Epic: Yes    Allergies have been reviewed and updated in Epic: Yes    Appropriateness of Therapy     Acute infections noted within Epic:  No active infections  Patient reported infection:  None    Is the medication and dose appropriate based on diagnosis, medication list, comorbidities, allergies, medical history, patient???s ability to self-administer the medication, and therapeutic goals? Yes    Prescription has been clinically reviewed: Yes      Baseline Quality of Life Assessment      How many days over the past month did your condition  keep you from your normal activities? For example, brushing your teeth or getting up in the morning. 0    Financial Information     Medication Assistance provided: Prior Authorization    Anticipated copay of $12.15 reviewed with patient. Verified delivery address.    Delivery Information     Scheduled delivery date: 03/29/23    Expected start date: 03/30/23      Medication will be delivered via UPS to the prescription address in [facility].  This shipment will not require a signature.      Explained the services we provide at [facility] and that each month we would call to set up refills.  Stressed importance of returning phone calls so that we could ensure they receive their medications in time each month.  Informed patient that we should be setting up refills 7-10 days prior to when they will run out of medication.  A pharmacist will reach out to perform a clinical assessment periodically.  Informed patient that a welcome packet, containing information about our pharmacy and other support services, a Notice of Privacy Practices, and a drug information handout will be sent.      The patient or caregiver noted above participated in the development of this care plan and knows that they can request review of or adjustments to the care plan at any time.      Patient or caregiver verbalized understanding of the above information as well as how to contact the pharmacy at [phone] option 4 with any questions/concerns.  The pharmacy is open Monday through Friday 8:30am-4:30pm.  A pharmacist is available 24/7 via pager to answer any clinical questions they may have.    Patient Specific Needs     Does the patient have any physical, cognitive, or cultural barriers? No    Does the patient have adequate living arrangements? (i.e. the ability to store and take their medication appropriately) Yes    Did you identify any home environmental safety or security hazards? No    Patient prefers to have medications discussed with  Patient     Is the patient or caregiver able to read and understand education materials at a high school level or above? Yes    Patient's primary language is  English     Is the patient high risk? No    Does the patient have an additional or emergency contact listed in their chart? Yes    SOCIAL DETERMINANTS OF HEALTH     At the [facility], we have learned that life circumstances - like trouble affording food, housing, utilities, or transportation can affect the health of many of our patients.   That is why we wanted to ask: are you currently experiencing any life circumstances that are negatively impacting your health and/or quality of life? No    Social Drivers of Health     Food Insecurity: No Food Insecurity (04/25/2022)    Hunger Vital Sign     Worried About Running Out of Food in the Last Year: Never true     Ran Out of Food in the Last  Year: Never true   Internet Connectivity: No Internet connectivity concern identified (09/08/2022)    Internet Connectivity     Do you have access to internet services: Yes     How do you connect to the internet: Personal Device at home     Is your internet connection strong enough for you to watch video on your device without major problems?: Yes     Do you have enough data to get through the month?: Yes     Does at least one of the devices have a camera that you can use for video chat?: Yes   Housing/Utilities: Low Risk  (04/25/2022)    Housing/Utilities     Within the past 12 months, have you ever stayed: outside, in a car, in a tent, in an overnight shelter, or temporarily in someone else's home (i.e. couch-surfing)?: No     Are you worried about losing your housing?: No     Within the past 12 months, have you been unable to get utilities (heat, electricity) when it was really needed?: No   Tobacco Use: Low Risk  (02/16/2023)    Patient History     Smoking Tobacco Use: Never     Smokeless Tobacco Use: Never     Passive Exposure: Not on file   Transportation Needs: No Transportation Needs (04/25/2022)    PRAPARE - Transportation     Lack of Transportation (Medical): No     Lack of Transportation (Non-Medical): No   Alcohol Use: Not At Risk (06/29/2022)    Alcohol Use     How often do you have a drink containing alcohol?: Never     How many drinks containing alcohol do you have on a typical day when you are drinking?: 1 - 2     How often do you have 5 or more drinks on one occasion?: Never   Interpersonal Safety: Not on file   Physical Activity: Not on file   Intimate Partner Violence: Not At Risk (06/29/2022)    Humiliation, Afraid, Rape, and Kick questionnaire     Fear of Current or Ex-Partner: No     Emotionally Abused: No     Physically Abused: No     Sexually Abused: No   Stress: Not on file   Substance Use: Low Risk  (09/08/2022)    Substance Use     In the past year, how often have you used prescription drugs for non-medical reasons?: Never     In the past year, how often have you used illegal drugs?: Never     In the past year, have you used any substance for non-medical reasons?: No   Social Connections: Not on file   Financial Resource Strain: Low Risk  (04/25/2022)    Overall Financial Resource Strain (CARDIA)     Difficulty of Paying Living Expenses: Not hard at all   Depression: Not at risk (09/08/2022)    PHQ-2     PHQ-2 Score: 0   Health Literacy: Low Risk  (09/08/2022)    Health Literacy     : Never       Would you be willing to receive help with any of the needs that you have identified today? Not applicable       Fatiha Guzy Vangie Bicker, PharmD  [facility] Specialty Pharmacist

## 2023-03-30 ENCOUNTER — Encounter: Admit: 2023-03-30 | Discharge: 2023-03-31 | Payer: MEDICARE

## 2023-03-30 DIAGNOSIS — N390 Urinary tract infection, site not specified: Principal | ICD-10-CM

## 2023-03-30 DIAGNOSIS — D7581 Myelofibrosis: Principal | ICD-10-CM

## 2023-03-30 LAB — CBC W/ AUTO DIFF
BASOPHILS ABSOLUTE COUNT: 0.4 10*9/L — ABNORMAL HIGH (ref 0.0–0.1)
BASOPHILS RELATIVE PERCENT: 1.7 %
EOSINOPHILS ABSOLUTE COUNT: 0.7 10*9/L — ABNORMAL HIGH (ref 0.0–0.5)
EOSINOPHILS RELATIVE PERCENT: 3.2 %
HEMATOCRIT: 31.8 % — ABNORMAL LOW (ref 34.0–44.0)
HEMOGLOBIN: 10.1 g/dL — ABNORMAL LOW (ref 11.3–14.9)
LYMPHOCYTES ABSOLUTE COUNT: 1.9 10*9/L (ref 1.1–3.6)
LYMPHOCYTES RELATIVE PERCENT: 8.1 %
MEAN CORPUSCULAR HEMOGLOBIN CONC: 31.9 g/dL — ABNORMAL LOW (ref 32.0–36.0)
MEAN CORPUSCULAR HEMOGLOBIN: 25.6 pg — ABNORMAL LOW (ref 25.9–32.4)
MEAN CORPUSCULAR VOLUME: 80.5 fL (ref 77.6–95.7)
MEAN PLATELET VOLUME: 9.5 fL (ref 6.8–10.7)
MONOCYTES ABSOLUTE COUNT: 0.6 10*9/L (ref 0.3–0.8)
MONOCYTES RELATIVE PERCENT: 2.5 %
NEUTROPHILS ABSOLUTE COUNT: 19.4 10*9/L — ABNORMAL HIGH (ref 1.8–7.8)
NEUTROPHILS RELATIVE PERCENT: 84.5 %
PLATELET COUNT: 19 10*9/L — CL (ref 150–450)
RED BLOOD CELL COUNT: 3.96 10*12/L (ref 3.95–5.13)
RED CELL DISTRIBUTION WIDTH: 22.5 % — ABNORMAL HIGH (ref 12.2–15.2)
WBC ADJUSTED: 23 10*9/L — ABNORMAL HIGH (ref 3.6–11.2)

## 2023-03-30 LAB — IRON PANEL
IRON SATURATION: 12 % — ABNORMAL LOW (ref 20–55)
IRON: 30 ug/dL — ABNORMAL LOW (ref 50–170)
TOTAL IRON BINDING CAPACITY: 244 ug/dL — ABNORMAL LOW (ref 250–425)

## 2023-03-30 LAB — FERRITIN: FERRITIN: 116 ng/mL (ref 7.3–270.7)

## 2023-03-30 MED ORDER — PREMARIN 0.625 MG/GRAM VAGINAL CREAM
3 refills | 0.00 days
Start: 2023-03-30 — End: ?

## 2023-03-30 MED ADMIN — diphenhydrAMINE (BENADRYL) capsule/tablet 50 mg: 50 mg | ORAL | @ 17:00:00 | Stop: 2023-03-30

## 2023-03-30 MED ADMIN — iron dextran (INFED) 25 mg in sodium chloride (NS) 0.9 % 25 mL IVPB: 25 mg | INTRAVENOUS | @ 17:00:00 | Stop: 2023-03-30

## 2023-03-30 MED ADMIN — iron dextran (INFED) 1,000 mg in sodium chloride (NS) 0.9 % 250 mL IVPB: 1000 mg | INTRAVENOUS | @ 18:00:00 | Stop: 2023-03-30

## 2023-03-30 NOTE — Unmapped (Signed)
 Patient presents here for Iron dextran infusion. Denies any recent fever/chills or exposed to sick persons. Pt was provided verbal and written instruction on medication and verbalized understanding same. Concern questions were answered. VSS. IV placed. Cal bell within reach. Premedications administered as per ordered.     1222 Initiated test dosae of Iron dextran  25 mg at this time     1235 Test dose completed w/o any issues. VSS Iv flushed per protocol     1312  Initiated full dose of Iron dextran      1412  Infusion completed with no adverse reaction. VSS. Pt tolerated for post infusion observation. IV flushed with NS per protocol. IV d/c'd. Pt left clinic in stable condition

## 2023-03-30 NOTE — Unmapped (Signed)
 [facility] Other Call ONC Phone Room Smart Lists: Medication Refill -     Hi,    Patient Emily Walter called requesting a medication refill for the following:    Medication: OJJAARA 100mg   Pharmacy: [facility]       Tysheena is calling requesting that this medication be filled and sent out for her to start taking her medication.    The expected turnaround time is 3-4 business days     Thank you,  Noland Fordyce  [facility]  [phone]    [facility]

## 2023-04-03 DIAGNOSIS — D7581 Myelofibrosis: Principal | ICD-10-CM

## 2023-04-03 NOTE — Unmapped (Signed)
 [facility] Other Call ONC Phone Room Smart Lists: Discuss Care Plan -     Hi,     Chandi contacted the Communication Center requesting to speak with the care team of SAVITA RUNNER to discuss:    States that she was told to call when she received the [facility] medication, she received it yesterday at 6pm     Please contact Shyana at [phone]    Thank you,   Iona Hansen  [facility]   [phone]    [facility]

## 2023-04-09 DIAGNOSIS — D7581 Myelofibrosis: Principal | ICD-10-CM

## 2023-04-10 ENCOUNTER — Ambulatory Visit: Admit: 2023-04-10 | Discharge: 2023-04-11 | Payer: MEDICARE | Attending: Adult Health | Primary: Adult Health

## 2023-04-10 DIAGNOSIS — D7581 Myelofibrosis: Principal | ICD-10-CM

## 2023-04-10 LAB — CBC W/ DIFFERENTIAL
BASOPHILS ABSOLUTE COUNT: 1.1 10*3/uL — ABNORMAL HIGH (ref 0.0–0.2)
BASOPHILS RELATIVE PERCENT: 4 %
EOSINOPHILS ABSOLUTE COUNT: 1.1 10*3/uL — ABNORMAL HIGH (ref 0.0–0.4)
EOSINOPHILS RELATIVE PERCENT: 4 %
HEMATOCRIT: 35.4 % (ref 34.0–46.6)
HEMOGLOBIN: 10.6 g/dL — ABNORMAL LOW (ref 11.1–15.9)
LYMPHOCYTES ABSOLUTE COUNT: 2.9 10*3/uL (ref 0.7–3.1)
LYMPHOCYTES RELATIVE PERCENT: 11 %
MEAN CORPUSCULAR HEMOGLOBIN CONC: 29.9 g/dL — ABNORMAL LOW (ref 31.5–35.7)
MEAN CORPUSCULAR HEMOGLOBIN: 26.4 pg — ABNORMAL LOW (ref 26.6–33.0)
MEAN CORPUSCULAR VOLUME: 88 fL (ref 79–97)
MONOCYTES ABSOLUTE COUNT: 0.5 10*3/uL (ref 0.1–0.9)
MONOCYTES RELATIVE PERCENT: 2 %
NEUTROPHILS ABSOLUTE COUNT: 19 10*3/uL — ABNORMAL HIGH (ref 1.4–7.0)
NEUTROPHILS RELATIVE PERCENT: 70 %
NUCLEATED RED BLOOD CELLS: 2 % — ABNORMAL HIGH (ref 0–0)
PLATELET COUNT: 17 10*3/uL — CL (ref 150–450)
RED BLOOD CELL COUNT: 4.01 x10E6/uL (ref 3.77–5.28)
RED CELL DISTRIBUTION WIDTH: 20.7 % — ABNORMAL HIGH (ref 11.7–15.4)
WHITE BLOOD CELL COUNT: 26.7 10*3/uL (ref 3.4–10.8)

## 2023-04-10 LAB — IMMATURE CELLS
BANDS: 1 %
METAMYLOCYTES-LABCORP: 4 % — ABNORMAL HIGH (ref 0–0)
MYELOCYTES: 4 % — ABNORMAL HIGH (ref 0–0)

## 2023-04-10 LAB — COMPREHENSIVE METABOLIC PANEL
ALBUMIN: 3.9 g/dL (ref 3.8–4.8)
ALKALINE PHOSPHATASE: 89 IU/L (ref 44–121)
ALT (SGPT): 12 IU/L (ref 0–32)
AST (SGOT): 20 IU/L (ref 0–40)
BILIRUBIN TOTAL (MG/DL) IN SER/PLAS: 0.8 mg/dL (ref 0.0–1.2)
BLOOD UREA NITROGEN: 22 mg/dL (ref 8–27)
BUN / CREAT RATIO: 16 (ref 12–28)
CALCIUM: 8.6 mg/dL — ABNORMAL LOW (ref 8.7–10.3)
CHLORIDE: 103 mmol/L (ref 96–106)
CO2: 25 mmol/L (ref 20–29)
CREATININE: 1.34 mg/dL — ABNORMAL HIGH (ref 0.57–1.00)
EGFR: 42 mL/min/{1.73_m2} — ABNORMAL LOW
GLOBULIN, TOTAL: 3 g/dL (ref 1.5–4.5)
GLUCOSE: 101 mg/dL — ABNORMAL HIGH (ref 70–99)
POTASSIUM: 4 mmol/L (ref 3.5–5.2)
SODIUM: 143 mmol/L (ref 134–144)
TOTAL PROTEIN: 6.9 g/dL (ref 6.0–8.5)

## 2023-04-10 NOTE — Unmapped (Signed)
 1001 Unable to reach patient.

## 2023-04-10 NOTE — Unmapped (Unsigned)
 Myeloproliferative Neoplasms Clinic Follow up Visit    Patient: Emily Walter  MRN: [id number]  DOB: 1950/07/22  Date of Visit: 04/10/2023      This patient visit was completed through the use of an audio/video or telephone encounter.    I spent 15 minutes on the phone with the patient. I spent an additional 15 minutes on pre- and post-visit activities.     The patient was physically located in [place] or a state in which I am permitted to provide care. The patient understood that s/he may incur co-pays and cost sharing, and agreed to the telemedicine visit. The visit was completed via phone and/or video, which was appropriate and reasonable under the circumstances given the patient's presentation at the time.    The patient has been advised of the potential risks and limitations of this mode of treatment (including, but not limited to, the absence of in-person examination) and has agreed to be treated using telemedicine. The patient's/patient's family's questions regarding telemedicine have been answered.     If the phone/video visit was completed in an ambulatory setting, the patient has also been advised to contact their provider???s office for worsening conditions, and seek emergency medical treatment and/or call 911 if the patient deems either necessary.    Visit conducted by: Telephone  Person contacted: Garner Nash  Contact phone number: [phone]  Is there someone else in the room? no         Reason for Visit   Emily Walter is a 73 y.o. with HFpEF, OSA, DM2, hypertension, CKD 3 (eGFR 77ml/min), staghorn calculi, chronic back pain, and triple negative primary myelofibrosis (higher risk) diagnosed 07/2016 who presents today in early follow up for progressive cytopenias prompting a bone marrow biopsy on 03/03/23.       Interval History   Since last being seen by Dr. Anise Salvo in 03/12/2023, Emily Walter was started on momelotinib for triple-negative primary myelofibrosis.     She started Ojjaara on February 24th and has experienced a significant improvement in energy levels, allowing for increased activity. She is currently taking water pills daily and has not noticed any negative side effects from her current medication regimen.    She describes persistent but improved left-sided abdominal pain, which she associates with her spleen. The pain is intermittent, whereas previously it was more frequent.    Her shortness of breath has improved significantly. Previously, she required frequent stops during outings due to breathlessness and throat pain, but she no longer experiences these issues.    She continues to experience night sweats, although she did not have any the previous night, which she attributes to spending the night elsewhere.    She experiences nausea when consuming greasy foods. No chest pain, bleeding, or other gastrointestinal symptoms such as vomiting or diarrhea.          Review of Systems   10-systems otherwise reviewed and negative except as per Interval History.    Assessment   #1 Primary myelofibrosis, intermediate-2 by DIPSS-Plus, High-risk by MIPSS70-Plus - with dysplastic progression 02/2023  #2 Thrombocytopenia - severe, plts 18 -  thought secondary to #1  #3 Anemia - Multifactorial, secondary to iron deficiency, #1, CKD.  Hgb 9.9g/dL, not requiring transfusions.    Initiated on Ojjaara (momelotinib) with significant improvement in energy levels and activities. Abdominal pain, likely due to splenomegaly, has decreased from constant to intermittent. Throat tightening, previously suspected to be reflux-related, is well-managed. Night sweats have reduced, with occasional nights  without sweating. No bleeding episodes. Hemoglobin improved post-iron infusion; platelet and white blood cell counts are stable. Renal and hepatic functions are well-maintained. Metro Kung is anticipated to reduce spleen size over time.  - Continue Ojjaara (momelotinib) therapy.  - Monitor for side effects such as nausea, particularly with greasy foods, and advise dietary modifications as needed.  - Order lab work at [facility] in a month to monitor blood counts and organ function.  - Schedule a follow-up phone visit in four weeks to discuss lab results and clinical status.  - Schedule an in-person clinic visit in eight weeks to assess spleen size and overall response to treatment.    #4 Splenomegaly - spleen 19.1cm on CT A/P from 01/14/23; was 15.6cm on  05/2022 and normal in 2020  Secondary to myelofibrosis. Abdominal pain on the left side has improved but remains intermittent. Metro Kung is expected to reduce spleen size over time.  - Monitor abdominal pain and splenomegaly symptoms.  - Assess spleen size during the in-person clinic visit in eight weeks.    #5 Chronic lumbar back pain - managed by PCP  #6 Lucency of right shoulder on right shoulder X-ray from 08/10/2021- SPEP/IFE obtained 03/12/23. Faint band suspicious for monoclonal component typed as IgM Lambda. Concentration of possible monoclonal protein cannot be accurately quantified due to the polyclonal background.   #7 HFpEF - no LE edema, stable symptoms - established with [facility] Cardiology  #8 Staghorn renal calculi - follows with [facility]  #9 CKD3 - eGFR 82ml/min - follows with PCP  #10 Dyspnea and throat pain on exertion - pt had nuclear stress test 03/08/23 - results pending - followed by PCP  #11 Depression - on sertraline 25mg  po daily - managed by PCP  #12 Iron deficiency- ferritin 140, iron 35, iron sat 14% on 12/18/2022.  Last received Infed 1g on 07/14/22.  Iron infusion last month improved hemoglobin levels. Continued monitoring of blood counts is necessary to assess treatment response.  - Monitor hemoglobin levels with monthly lab work.      Plan   Continue momelotinib 100mg  po daily  Dose reduced by 50% due to plts 18, baseline CKD (eGFR 20ml/min). Plan to increase if tolerates.   Indication = PMF with symptomatic splenomegaly + anemia  Monitoring = Local labs at [facility].  CBC-D, CMP - standing orders placed for q2 weeks for now given severe thrombocytopenia.   Avoid aspirin/NSAIDs due to severe thrombocytopenia  Labs in 2 and 4 weeks (4 week labs at [facility] PCP appt)  Phone visit 4 weeks  In person visit 8 weeks    Markus Jarvis, RN, MSN, AGPCNP-C  Nurse Practitioner  Hematologic Malignancies  [facility]  04/10/2023        History of Present Illness     Hematology/Oncology History   MDS/MPN (myelodysplastic/myeloproliferative neoplasms) (CMS-HCC)   08/03/2016 Initial Diagnosis    PRIMARY MYELOFIBROSIS vs MDS/MPN with fibrosis    Prior labs show:  2014 at [facility]: normal CBC, WBC 6.5, Hgb 15.2, Plts 181.    CBC from 08/03/2016: WBC 10.3 x 10^9/L, Hgb 12.3 g/dL, MCV 85, plt 52 x 16^1/W.    Due to the cytopenias, Emily Walter was sent to Dr. Maryan Char and underwent a bone marrow biopsy. I currently do not have bone marrow slides to review.  Outside [facility] report from 08/03/2016 states,  Hypercellular bone marrow (85%) with hyperplastic hematopoiesis.  Atypical megakaryocyte hyperplasia with focal clustering.  Abnormal localization of immature myeloid precursors.  No significant increase in blasts.  Mild to moderate reticulin fibrosis.    Driver mutation = SRSF2 (1610)  Karyotype = 46,XX,add(4)(q33),add(17)(q21)[13]     Higher-risk disease, with thrombocytopenia, SRSF2 mutation.      She was not an optimal alloHSCT candidate due to multiple co-morbidities.      She trialed ruxolitinib but did not have a good response.     07/14/2022:  Received Infed 1g IV for iron deficiency.     03/10/2022 -  Chemotherapy    MOMELOTINIB    Indication = primary myelofibrosis with anemia   Starting dose = 100mg  po daily (reduced for platelet count 18)    03/12/23: WBC 24.7, Hgb 10, Plts 18. sCr 1.34.      02/21/2023 Progression    MYELODYSPLASTIC PROGRESSION     She has been on observation alone for several years.  She now has progressive cytopenias, with platelets trending down to teens from 40-50's in April 2024 and hgb trending down from 11-12 g/dL in Feb/March 2024 to 9.9 g/dL now.  WBC has progressively increased, was 10 in 05/2022 and now consistently >15.  Myeloid mutation panel with persistent SRSF2 mutation and new ASXL1 mutation with VAF 5%. In this context, a bone marrow biopsy was performed at [facility] on 02/21/23.  This showed persistent myelofibrosis with dysplastic features, consistent with MDS/MPN or perhaps MDS with myelofibrosis.      Karyotype is pending, but prior karyotype 46,XX with add(4) and add(17).       The bone marrow now appears most consistent with MDS.    For prognostic calculations, I used labs from 02/21/23: ANC 15, hgb 9.9, plts 18, bm blasts 2%, age 51yo, intermediate karyotype, SRSF2, ASXL1.      IPSS:  Int-1 (1 pt)  IPSS-R: Intermediate. (4pt) 63yr median overall survival.  25% AML transformation 3.2 yrs.   IPSS-M: Moderate-High. Leukemia-free survival 2.3 yrs. Overall survival 2.8 yrs.              Medications     Current Outpatient Medications   Medication Sig Dispense Refill    albuterol HFA 90 mcg/actuation inhaler Inhale 2 puffs every six (6) hours as needed for wheezing or shortness of breath. 8 g 3    cholecalciferol, vitamin D3-50 mcg, 2,000 unit,, 50 mcg (2,000 unit) tablet Take 1 tablet (50 mcg total) by mouth in the morning.      conjugated estrogens (PREMARIN) 0.625 mg/gram vaginal cream Insert 0.5 g into the vagina Two (2) times a week. 30 g 1    diclofenac sodium (VOLTAREN) 1 % gel Apply 4 g topically 4 (four) times a day as needed for arthritis or pain (Knee pain). 100 g 2    doxycycline (VIBRAMYCIN) 100 MG capsule Take 1 capsule (100 mg total) by mouth daily. 90 capsule 0    enalapril (VASOTEC) 5 MG tablet TAKE 1 TABLET EVERY DAY 90 tablet 3    fluticasone propionate (FLONASE) 50 mcg/actuation nasal spray 2 sprays into each nostril daily as needed for allergies.      furosemide (LASIX) 40 MG tablet Take 1 tablet (40 mg total) by mouth daily. 90 tablet 3 levocetirizine (XYZAL) 5 MG tablet Take 1 tablet (5 mg total) by mouth every evening. 90 tablet 3    lidocaine (LIDODERM) 5 % patch Place 1 patch on the skin daily. Apply to affected area for 12 hours only each day (then remove patch)      lovastatin (MEVACOR) 40 MG tablet Take 1 tablet (40  mg total) by mouth daily. 90 tablet 3    miscellaneous medical supply Misc Rollator for daily use with ambulation dx: M48.061      momelotinib (OJJAARA) 100 mg tablet Take 1 tablet (100 mg total) by mouth daily. Swallow tablets whole; do not cut, crush, or chew. 30 tablet 2    NARCAN 4 mg/actuation nasal spray 1 spray into alternating nostrils once as needed.  0    omeprazole (PRILOSEC) 40 MG capsule Take 1 capsule (40 mg total) by mouth daily as needed (reflux/heartburn). 90 capsule 3    ondansetron (ZOFRAN-ODT) 4 MG disintegrating tablet Dissolve 1 tablet (4 mg total) in the mouth every eight (8) hours as needed for nausea. 15 tablet 3    oxyCODONE (ROXICODONE) 10 mg immediate release tablet Take 1 tablet (10 mg total) by mouth every four (4) hours as needed for pain. 20 tablet 0    oxyCODONE-acetaminophen (PERCOCET) 10-325 mg per tablet TAKE 1 TABLET BY MOUTH THREE TIMES A DAY AS NEEDED FOR PAIN      polyethylene glycol (GOLYTELY) 236-22.74-6.74 gram solution Take by mouth as directed per [facility] GI prep instructions, for split bowel prep. 4000 mL 0    rOPINIRole (REQUIP) 0.5 MG tablet Take 1 tablet (0.5 mg total) by mouth nightly. 30 tablet 11    sertraline (ZOLOFT) 25 MG tablet Take 1 tablet (25 mg total) by mouth daily. 90 tablet 3    spironolactone (ALDACTONE) 25 MG tablet TAKE 1 TABLET EVERY DAY 90 tablet 3    ULTRA-LIGHT ROLLATOR Misc Use as directed.  0     No current facility-administered medications for this visit.       Allergies     Allergies   Allergen Reactions    Fentanyl Anaphylaxis    Codeine Other (See Comments)     Headache, nausea    Corticosteroids (Glucocorticoids) Headache     **ALL STEROIDS**    Gabapentin Other (See Comments)     oversedation    Prednisone Headache    Pregabalin Rash     Swelling and general fatigue       Past Medical and Surgical History     Past Medical History:   Diagnosis Date    Anxiety     Arthritis     Asthma     Cancer (CMS-HCC)     Cataract     Chronic kidney disease     Depression     Difficult intravenous access     GERD (gastroesophageal reflux disease)     Heart failure (CMS-HCC)     HLD (hyperlipidemia)     Hypertension     Myelofibrosis (CMS-HCC)     Nephrolithiasis     Sleep apnea     Tinnitus      Past Surgical History:   Procedure Laterality Date    HYSTERECTOMY      PR CYSTO/URETERO W/LITHOTRIPSY &INDWELL STENT INSRT Left 08/21/2018    Procedure: priority CYSTOURETHROSCOPY, WITH URETEROSCOPY AND/OR PYELOSCOPY; WITH LITHOTRIPSY INCLUDING INSERTION OF INDWELLING URETERAL STENT;  Surgeon: Reine Just Viprakasit, MD;  Location: CYSTO PROCEDURE SUITES [facility];  Service: Urology    PR CYSTO/URETERO/PYELOSCOPY, CALCULUS TX Left 09/04/2018    Procedure: CYSTOURETHROSCOPY, W/URETEROSCOPY &/OR PYELOSCOPY; W/REMOVAL/MANIPULATION CALCULUS(URETERAL CATH INCLUDED);  Surgeon: Reine Just Viprakasit, MD;  Location: CYSTO PROCEDURE SUITES [facility];  Service: Urology    PR CYSTOSCOPY,INSERT URETERAL STENT Left 05/04/2022    Procedure: CYSTOURETHROSCOPY,  WITH INSERTION OF INDWELLING URETERAL STENT (EG, GIBBONS OR DOUBLE-J TYPE);  Surgeon: Karene Fry  Tenny Craw, MD;  Location: MAIN OR [facility];  Service: Urology    PR CYSTOSCOPY,REMV CALCULUS,SIMPLE Left 09/04/2018    Procedure: CYSTOURETHROSCOPY, WITH REMOVAL OF FOREIGN BODY, CALCULUS OR URETERAL STENT FROM URETHRA OR BLADDER; SIMPLE;  Surgeon: Reine Just Viprakasit, MD;  Location: CYSTO PROCEDURE SUITES [facility];  Service: Urology    PR CYSTOURETHROSCOPY,URETER CATHETER Left 08/21/2018    Procedure: CYSTOURETHROSCOPY, W/URETERAL CATHETERIZATION, W/WO IRRIG, INSTILL, OR URETEROPYELOG, EXCLUS OF RADIOLG SVC;  Surgeon: Reine Just Viprakasit, MD;  Location: CYSTO PROCEDURE SUITES [facility];  Service: Urology    PR CYSTOURETHROSCOPY,URETER CATHETER Left 09/04/2018    Procedure: CYSTOURETHROSCOPY, W/URETERAL CATHETERIZATION, W/WO IRRIG, INSTILL, OR URETEROPYELOG, EXCLUS OF RADIOLG SVC;  Surgeon: Reine Just Viprakasit, MD;  Location: CYSTO PROCEDURE SUITES [place];  Service: Urology    PR PERQ DILATION XST TRC NEW ACCESS RENAL COLTJ SYS Left 04/24/2022    Procedure: DILATION OF EXISTING TRACT, PERC, FOR ENDOUROLOGIC PROC INCL IMAGING GUIDANCE & RADIOLOGIC SUPERVISION & INTERPRET, W POSTPROC TUBE PLACEMENT, WHEN PERF; INCL NEW ACCESS INTO RENAL COLLECTING SYSTEM;  Surgeon: Vickii Penna, MD;  Location: [facility] OR [facility];  Service: Urology    PR PERQ NL/PL LITHOTRP COMPLEX >2 CM MLT LOCATIONS Left 04/24/2022    Procedure: standard PCNL prone vs. standard; need fortec;  Surgeon: Vickii Penna, MD;  Location: [facility] OR [facility];  Service: Urology    PR REMOVE BLADDER STONE,<2.5 CM N/A 08/21/2018    Procedure: LITHOLAPAXY: CRUSH/FRAGMENT CALCULUS, BY ANY MEANS IN BLADDER & REMOVAL OF FRAGMENTS; SIMPLE/SMALL <2.5 CM;  Surgeon: Reine Just Viprakasit, MD;  Location: CYSTO PROCEDURE SUITES [facility];  Service: Urology    SALPINGOOPHORECTOMY Right     12/07/2010    VAGINAL HYSTERECTOMY      12/07/2010       Social History     Social History     Socioeconomic History    Marital status: Married   Tobacco Use    Smoking status: Never    Smokeless tobacco: Never   Vaping Use    Vaping status: Never Used   Substance and Sexual Activity    Alcohol use: No    Drug use: No    Sexual activity: Not Currently     Social Drivers of Health     Financial Resource Strain: Low Risk  (04/25/2022)    Overall Financial Resource Strain (CARDIA)     Difficulty of Paying Living Expenses: Not hard at all   Food Insecurity: No Food Insecurity (04/25/2022)    Hunger Vital Sign     Worried About Running Out of Food in the Last Year: Never true     Ran Out of Food in the Last Year: Never true   Transportation Needs: No Transportation Needs (04/25/2022)    PRAPARE - Therapist, art (Medical): No     Lack of Transportation (Non-Medical): No   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  Exposed to 2nd hand smoke; she is a lifelong non-smoker.  Went on disability in 11-May-2004 d/t back and has worsened this year.      Enjoys traveling.  Married - husband is widowed.      Family History   Two sons - the youngest died in May 12, 2011 at the age of 38 from bladder cancer.     Physical Examination     There were no vitals filed for this visit.      GENERAL: Appears tired but in no acute distress.  Accompanied by  her husband.  HEENT: No scleral icterus.  No periorbital purpura.  No macroglossia.  RESP: CTAB.  Base of lungs is clear to auscultation bilaterally.  CV: RRR.  No murmurs rubs or gallops appreciated.  ABD: Limited by seated position and body habitus.  NEURO: A&Ox4, CN observed are WNL  PSYCH: Normal affect, affect is full and congruent.  No psychomotor agitation nor retardation.  Thought process is goal-directed and linear.      Laboratory Testing and Imaging     Results for orders placed or performed in visit on 03/30/23   Iron Panel   Result Value Ref Range    Iron 30 (L) 50 - 170 ug/dL    TIBC 161 (L) 096 - 045 ug/dL    Iron Saturation (%) 12 (L) 20 - 55 %   Ferritin   Result Value Ref Range    Ferritin 116.0 7.3 - 270.7 ng/mL   CBC w/ Differential   Result Value Ref Range    Results Verified by Slide Scan Slide Reviewed     WBC 23.0 (H) 3.6 - 11.2 10*9/L    RBC 3.96 3.95 - 5.13 10*12/L    HGB 10.1 (L) 11.3 - 14.9 g/dL    HCT 40.9 (L) 81.1 - 44.0 %    MCV 80.5 77.6 - 95.7 fL    MCH 25.6 (L) 25.9 - 32.4 pg    MCHC 31.9 (L) 32.0 - 36.0 g/dL    RDW 91.4 (H) 78.2 - 15.2 %    MPV 9.5 6.8 - 10.7 fL    Platelet 19 (LL) 150 - 450 10*9/L    Neutrophils % 84.5 %    Lymphocytes % 8.1 %    Monocytes % 2.5 %    Eosinophils % 3.2 %    Basophils % 1.7 %    Absolute Neutrophils 19.4 (H) 1.8 - 7.8 10*9/L Absolute Lymphocytes 1.9 1.1 - 3.6 10*9/L    Absolute Monocytes 0.6 0.3 - 0.8 10*9/L    Absolute Eosinophils 0.7 (H) 0.0 - 0.5 10*9/L    Absolute Basophils 0.4 (H) 0.0 - 0.1 10*9/L    Anisocytosis Marked (A) Not Present     *Note: Due to a large number of results and/or encounters for the requested time period, some results have not been displayed. A complete set of results can be found in Results Review. 03/30/23   Iron Panel   Result Value Ref Range    Iron 30 (L) 50 - 170 ug/dL    TIBC 956 (L) 213 - 086 ug/dL    Iron Saturation (%) 12 (L) 20 - 55 %   Ferritin   Result Value Ref Range    Ferritin 116.0 7.3 - 270.7 ng/mL   CBC w/ Differential   Result Value Ref Range    Results Verified by Slide Scan Slide Reviewed     WBC 23.0 (H) 3.6 - 11.2 10*9/L    RBC 3.96 3.95 - 5.13 10*12/L    HGB 10.1 (L) 11.3 - 14.9 g/dL    HCT 57.8 (L) 46.9 - 44.0 %    MCV 80.5 77.6 - 95.7 fL    MCH 25.6 (L) 25.9 - 32.4 pg    MCHC 31.9 (L) 32.0 - 36.0 g/dL    RDW 62.9 (H) 52.8 - 15.2 %    MPV 9.5 6.8 - 10.7 fL    Platelet 19 (LL) 150 - 450 10*9/L    Neutrophils % 84.5 %    Lymphocytes % 8.1 %  Monocytes % 2.5 %    Eosinophils % 3.2 %    Basophils % 1.7 %    Absolute Neutrophils 19.4 (H) 1.8 - 7.8 10*9/L    Absolute Lymphocytes 1.9 1.1 - 3.6 10*9/L    Absolute Monocytes 0.6 0.3 - 0.8 10*9/L    Absolute Eosinophils 0.7 (H) 0.0 - 0.5 10*9/L    Absolute Basophils 0.4 (H) 0.0 - 0.1 10*9/L    Anisocytosis Marked (A) Not Present     *Note: Due to a large number of results and/or encounters for the requested time period, some results have not been displayed. A complete set of results can be found in Results Review.

## 2023-04-10 NOTE — Unmapped (Signed)
 [facility] Other Call ONC Phone Room Smart Lists: Critical Labs -       Clydie Braun with [employer] has contacted the [employer] to report critical lab results.    Please contact Clydie Braun at [phone] to confirm results.    Reference# [id number]    I have sent you a page for your reference as well.      Thank you,  Sharl Ma  [facility]  [phone]    [facility]

## 2023-04-13 DIAGNOSIS — D7581 Myelofibrosis: Principal | ICD-10-CM

## 2023-04-21 DIAGNOSIS — F329 Major depressive disorder, single episode, unspecified: Principal | ICD-10-CM

## 2023-04-21 MED ORDER — OMEPRAZOLE 40 MG CAPSULE,DELAYED RELEASE
ORAL_CAPSULE | 3 refills | 0.00 days
Start: 2023-04-21 — End: ?

## 2023-04-21 MED ORDER — SERTRALINE 25 MG TABLET
ORAL_TABLET | Freq: Every day | ORAL | 3 refills | 0.00 days
Start: 2023-04-21 — End: ?

## 2023-04-23 DIAGNOSIS — D7581 Myelofibrosis: Principal | ICD-10-CM

## 2023-04-23 MED ORDER — SERTRALINE 25 MG TABLET
ORAL_TABLET | Freq: Every day | ORAL | 3 refills | 90.00 days | Status: CP
Start: 2023-04-23 — End: 2024-04-17

## 2023-04-23 MED ORDER — OMEPRAZOLE 40 MG CAPSULE,DELAYED RELEASE
ORAL_CAPSULE | Freq: Every day | ORAL | 3 refills | 90.00 days | Status: CP
Start: 2023-04-23 — End: 2024-04-17

## 2023-04-23 NOTE — Unmapped (Signed)
 Per interface/mychart refill request.  Patient last seen by PCP on 02/15/2023.    Not clear if Dr Glenetta Hew is or is not patient's PCP

## 2023-04-25 NOTE — Unmapped (Signed)
 [facility] Clinical Assessment & Refill Coordination Note    Emily Walter, DOB: May 21, 1950  Phone: [phone] (home) [phone] (work)    All above HIPAA information was verified with patient.     Was a Nurse, learning disability used for this call? No    Specialty Medication(s):   Hematology/Oncology: Ojjaara     Current Outpatient Medications   Medication Sig Dispense Refill    albuterol HFA 90 mcg/actuation inhaler Inhale 2 puffs every six (6) hours as needed for wheezing or shortness of breath. 8 g 3    cholecalciferol, vitamin D3-50 mcg, 2,000 unit,, 50 mcg (2,000 unit) tablet Take 1 tablet (50 mcg total) by mouth in the morning.      conjugated estrogens (PREMARIN) 0.625 mg/gram vaginal cream Insert 0.5 g into the vagina Two (2) times a week. 30 g 1    diclofenac sodium (VOLTAREN) 1 % gel Apply 4 g topically 4 (four) times a day as needed for arthritis or pain (Knee pain). 100 g 2    doxycycline (VIBRAMYCIN) 100 MG capsule Take 1 capsule (100 mg total) by mouth daily. 90 capsule 0    enalapril (VASOTEC) 5 MG tablet TAKE 1 TABLET EVERY DAY 90 tablet 3    fluticasone propionate (FLONASE) 50 mcg/actuation nasal spray 2 sprays into each nostril daily as needed for allergies.      furosemide (LASIX) 40 MG tablet Take 1 tablet (40 mg total) by mouth daily. 90 tablet 3    levocetirizine (XYZAL) 5 MG tablet Take 1 tablet (5 mg total) by mouth every evening. 90 tablet 3    lidocaine (LIDODERM) 5 % patch Place 1 patch on the skin daily. Apply to affected area for 12 hours only each day (then remove patch)      lovastatin (MEVACOR) 40 MG tablet Take 1 tablet (40 mg total) by mouth daily. 90 tablet 3    miscellaneous medical supply Misc Rollator for daily use with ambulation dx: M48.061      momelotinib (OJJAARA) 100 mg tablet Take 1 tablet (100 mg total) by mouth daily. Swallow tablets whole; do not cut, crush, or chew. 30 tablet 2    NARCAN 4 mg/actuation nasal spray 1 spray into alternating nostrils once as needed.  0    omeprazole (PRILOSEC) 40 MG capsule Take 1 capsule (40 mg total) by mouth daily. 90 capsule 3    ondansetron (ZOFRAN-ODT) 4 MG disintegrating tablet Dissolve 1 tablet (4 mg total) in the mouth every eight (8) hours as needed for nausea. 15 tablet 3    oxyCODONE (ROXICODONE) 10 mg immediate release tablet Take 1 tablet (10 mg total) by mouth every four (4) hours as needed for pain. 20 tablet 0    oxyCODONE-acetaminophen (PERCOCET) 10-325 mg per tablet TAKE 1 TABLET BY MOUTH THREE TIMES A DAY AS NEEDED FOR PAIN      polyethylene glycol (GOLYTELY) 236-22.74-6.74 gram solution Take by mouth as directed per [facility] GI prep instructions, for split bowel prep. 4000 mL 0    rOPINIRole (REQUIP) 0.5 MG tablet Take 1 tablet (0.5 mg total) by mouth nightly. 30 tablet 11    sertraline (ZOLOFT) 25 MG tablet Take 1 tablet (25 mg total) by mouth daily. 90 tablet 3    spironolactone (ALDACTONE) 25 MG tablet TAKE 1 TABLET EVERY DAY 90 tablet 3    ULTRA-LIGHT ROLLATOR Misc Use as directed.  0     No current facility-administered medications for this visit.  Changes to medications: Emily Walter reports no changes at this time.    Medication list has been reviewed and updated in Epic: Yes    Allergies   Allergen Reactions    Fentanyl Anaphylaxis    Codeine Other (See Comments)     Headache, nausea    Corticosteroids (Glucocorticoids) Headache     **ALL STEROIDS**    Gabapentin Other (See Comments)     oversedation    Prednisone Headache    Pregabalin Rash     Swelling and general fatigue       Changes to allergies: No    Allergies have been reviewed and updated in Epic: Yes    SPECIALTY MEDICATION ADHERENCE     Ojjaara 100 mg: 7 days of medicine on hand       Medication Adherence    Patient reported X missed doses in the last month: 0  Specialty Medication: Ojjaara 100 mg  Patient is on additional specialty medications: No  Informant: patient          Specialty medication(s) dose(s) confirmed: Regimen is correct and unchanged.     Are there any concerns with adherence? No    Adherence counseling provided? Not needed    CLINICAL MANAGEMENT AND INTERVENTION      Clinical Benefit Assessment:    Do you feel the medicine is effective or helping your condition? Yes    Clinical Benefit counseling provided? Not needed    Adverse Effects Assessment:    Are you experiencing any side effects? No    Are you experiencing difficulty administering your medicine? No    Quality of Life Assessment:    Quality of Life    Rheumatology  Oncology  Dermatology  Cystic Fibrosis          How many days over the past month did your condition  keep you from your normal activities? For example, brushing your teeth or getting up in the morning. 0    Have you discussed this with your provider? Not needed    Acute Infection Status:    Acute infections noted within Epic:  No active infections    Patient reported infection: None    Therapy Appropriateness:    Is therapy appropriate based on current medication list, adverse reactions, adherence, clinical benefit and progress toward achieving therapeutic goals? Yes, therapy is appropriate and should be continued     Clinical Intervention:    Was an intervention completed as part of this clinical assessment? No    DISEASE/MEDICATION-SPECIFIC INFORMATION      N/A    Oncology: Is the patient receiving adequate infection prevention treatment? Not applicable  Does the patient have adequate nutritional support? Not applicable    PATIENT SPECIFIC NEEDS     Does the patient have any physical, cognitive, or cultural barriers? No    Is the patient high risk? No    Does the patient require physician intervention or other additional services (i.e., nutrition, smoking cessation, social work)? No    Does the patient have an additional or emergency contact listed in their chart? Yes    SOCIAL DETERMINANTS OF HEALTH     At the [facility], we have learned that life circumstances - like trouble affording food, housing, utilities, or transportation can affect the health of many of our patients.   That is why we wanted to ask: are you currently experiencing any life circumstances that are negatively impacting your health and/or quality of life? No    Social  Drivers of Health     Food Insecurity: No Food Insecurity (04/25/2022)    Hunger Vital Sign     Worried About Running Out of Food in the Last Year: Never true     Ran Out of Food in the Last Year: Never true   Tobacco Use: Low Risk  (03/30/2023)    Patient History     Smoking Tobacco Use: Never     Smokeless Tobacco Use: Never     Passive Exposure: Not on file   Transportation Needs: No Transportation Needs (04/25/2022)    PRAPARE - Transportation     Lack of Transportation (Medical): No     Lack of Transportation (Non-Medical): No   Alcohol Use: Not At Risk (06/29/2022)    Alcohol Use     How often do you have a drink containing alcohol?: Never     How many drinks containing alcohol do you have on a typical day when you are drinking?: 1 - 2     How often do you have 5 or more drinks on one occasion?: Never   Housing: Low Risk  (04/25/2022)    Housing     Within the past 12 months, have you ever stayed: outside, in a car, in a tent, in an overnight shelter, or temporarily in someone else's home (i.e. couch-surfing)?: No     Are you worried about losing your housing?: No   Physical Activity: Not on file   Utilities: Low Risk  (04/25/2022)    Utilities     Within the past 12 months, have you been unable to get utilities (heat, electricity) when it was really needed?: No   Stress: Not on file   Interpersonal Safety: Not on file   Substance Use: Low Risk  (09/08/2022)    Substance Use     In the past year, how often have you used prescription drugs for non-medical reasons?: Never     In the past year, how often have you used illegal drugs?: Never     In the past year, have you used any substance for non-medical reasons?: No   Intimate Partner Violence: Not At Risk (06/29/2022)    Humiliation, Afraid, Rape, and Kick questionnaire     Fear of Current or Ex-Partner: No     Emotionally Abused: No     Physically Abused: No     Sexually Abused: No   Social Connections: Not on file   Financial Resource Strain: Low Risk  (04/25/2022)    Overall Financial Resource Strain (CARDIA)     Difficulty of Paying Living Expenses: Not hard at all   Depression: Not at risk (09/08/2022)    PHQ-2     PHQ-2 Score: 0   Internet Connectivity: No Internet connectivity concern identified (09/08/2022)    Internet Connectivity     Do you have access to internet services: Yes     How do you connect to the internet: Personal Device at home     Is your internet connection strong enough for you to watch video on your device without major problems?: Yes     Do you have enough data to get through the month?: Yes     Does at least one of the devices have a camera that you can use for video chat?: Yes   Health Literacy: Low Risk  (09/08/2022)    Health Literacy     : Never       Would you be willing to receive help with  any of the needs that you have identified today? Not applicable       SHIPPING     Specialty Medication(s) to be Shipped:   Hematology/Oncology: Ojjaara    Other medication(s) to be shipped: No additional medications requested for fill at this time     Changes to insurance: No    Cost and Payment: Patient has a $0 copay, payment information is not required.    Delivery Scheduled: Yes, Expected medication delivery date: 05/01/23.     Medication will be delivered via UPS to the confirmed prescription address in [facility].    The patient will receive a drug information handout for each medication shipped and additional FDA Medication Guides as required.  Verified that patient has previously received a Conservation officer, historic buildings and a Surveyor, mining.    The patient or caregiver noted above participated in the development of this care plan and knows that they can request review of or adjustments to the care plan at any time.      All of the patient's questions and concerns have been addressed.    Cerria Randhawa Vangie Bicker, PharmD   [facility] Specialty Pharmacist

## 2023-04-30 DIAGNOSIS — D7581 Myelofibrosis: Principal | ICD-10-CM

## 2023-04-30 MED FILL — OJJAARA 100 MG TABLET: ORAL | 30 days supply | Qty: 30 | Fill #1

## 2023-04-30 NOTE — Unmapped (Signed)
 I spoke with patient Emily Walter to confirm appointments on the following date(s): 06/18/23 Labs and Return Active visit with Synetta Fail.     Faith Aura Fey

## 2023-05-07 DIAGNOSIS — D7581 Myelofibrosis: Principal | ICD-10-CM

## 2023-05-07 NOTE — Unmapped (Signed)
 [facility] Other Call ONC Phone Room Smart Lists: General -     Hi,     Senita contacted the Communication Center regarding the following:    - she received her Ojjaara 100mg  and needs to know when she needs to return to [facility]. She was told that it needed to be two weeks after taking the medication.     Please contact Betsey at [phone] .    Thanks in advance,    Durward Fortes  [facility]   [phone]    [facility]

## 2023-05-08 DIAGNOSIS — D7581 Myelofibrosis: Principal | ICD-10-CM

## 2023-05-08 NOTE — Unmapped (Signed)
 Called to let patient know she gets labs monthly and is due this week.  Left message with call back info.  Advised to call and let us know when she gets drawn so we can watch for results.

## 2023-05-09 DIAGNOSIS — I10 Essential (primary) hypertension: Principal | ICD-10-CM

## 2023-05-09 MED ORDER — SPIRONOLACTONE 25 MG TABLET
ORAL_TABLET | Freq: Every day | ORAL | 2 refills | 90 days | Status: CP
Start: 2023-05-09 — End: ?

## 2023-05-10 ENCOUNTER — Ambulatory Visit: Admit: 2023-05-10 | Discharge: 2023-05-10

## 2023-05-10 ENCOUNTER — Ambulatory Visit: Admit: 2023-05-10 | Discharge: 2023-05-10 | Attending: Medical | Primary: Medical

## 2023-05-10 DIAGNOSIS — Z87442 Personal history of urinary calculi: Principal | ICD-10-CM

## 2023-05-10 LAB — COMPREHENSIVE METABOLIC PANEL
ALBUMIN: 4 g/dL (ref 3.4–5.0)
ALKALINE PHOSPHATASE: 90 U/L (ref 46–116)
ALT (SGPT): <7 U/L — ABNORMAL LOW (ref 10–49)
ANION GAP: 11 mmol/L (ref 5–14)
AST (SGOT): 21 U/L (ref <=34)
BILIRUBIN TOTAL: 1.1 mg/dL (ref 0.3–1.2)
BLOOD UREA NITROGEN: 26 mg/dL — ABNORMAL HIGH (ref 9–23)
BUN / CREAT RATIO: 17
CALCIUM: 9.6 mg/dL (ref 8.7–10.4)
CHLORIDE: 97 mmol/L — ABNORMAL LOW (ref 98–107)
CO2: 34.4 mmol/L — ABNORMAL HIGH (ref 20.0–31.0)
CREATININE: 1.57 mg/dL — ABNORMAL HIGH (ref 0.55–1.02)
EGFR CKD-EPI (2021) FEMALE: 34 mL/min/1.73m2 — ABNORMAL LOW (ref >=60)
GLUCOSE RANDOM: 129 mg/dL (ref 70–179)
POTASSIUM: 4.5 mmol/L (ref 3.4–4.8)
PROTEIN TOTAL: 8.1 g/dL (ref 5.7–8.2)
SODIUM: 142 mmol/L (ref 135–145)

## 2023-05-10 LAB — CBC W/ AUTO DIFF
BASOPHILS ABSOLUTE COUNT: 0.7 10*9/L — ABNORMAL HIGH (ref 0.0–0.1)
BASOPHILS RELATIVE PERCENT: 3.2 %
EOSINOPHILS ABSOLUTE COUNT: 0.9 10*9/L — ABNORMAL HIGH (ref 0.0–0.5)
EOSINOPHILS RELATIVE PERCENT: 3.9 %
HEMATOCRIT: 36.5 % (ref 34.0–44.0)
HEMOGLOBIN: 11.6 g/dL (ref 11.3–14.9)
LYMPHOCYTES ABSOLUTE COUNT: 3.1 10*9/L (ref 1.1–3.6)
LYMPHOCYTES RELATIVE PERCENT: 14.1 %
MEAN CORPUSCULAR HEMOGLOBIN CONC: 31.9 g/dL — ABNORMAL LOW (ref 32.0–36.0)
MEAN CORPUSCULAR HEMOGLOBIN: 26.5 pg (ref 25.9–32.4)
MEAN CORPUSCULAR VOLUME: 83.2 fL (ref 77.6–95.7)
MEAN PLATELET VOLUME: 9.8 fL (ref 6.8–10.7)
MONOCYTES ABSOLUTE COUNT: 0.3 10*9/L (ref 0.3–0.8)
MONOCYTES RELATIVE PERCENT: 1.4 %
NEUTROPHILS ABSOLUTE COUNT: 16.9 10*9/L — ABNORMAL HIGH (ref 1.8–7.8)
NEUTROPHILS RELATIVE PERCENT: 77.4 %
NUCLEATED RED BLOOD CELLS: 1 /100{WBCs} (ref <=4)
PLATELET COUNT: 18 10*9/L — ABNORMAL LOW (ref 150–450)
RED BLOOD CELL COUNT: 4.39 10*12/L (ref 3.95–5.13)
RED CELL DISTRIBUTION WIDTH: 22.6 % — ABNORMAL HIGH (ref 12.2–15.2)
WBC ADJUSTED: 21.8 10*9/L — ABNORMAL HIGH (ref 3.6–11.2)

## 2023-05-10 LAB — IRON & TIBC
IRON SATURATION (CALC): 19 %
IRON: 35 ug/dL — ABNORMAL LOW (ref 50–170)
TOTAL IRON BINDING CAPACITY (CALC): 187.7 ug/dL — ABNORMAL LOW (ref 250.0–425.0)
TRANSFERRIN: 149 mg/dL — ABNORMAL LOW (ref 250.0–380.0)

## 2023-05-10 LAB — LACTATE DEHYDROGENASE: LACTATE DEHYDROGENASE: 683 U/L — ABNORMAL HIGH (ref 120–246)

## 2023-05-10 LAB — FERRITIN: FERRITIN: 233.4 ng/mL (ref 7.3–270.7)

## 2023-05-10 LAB — SLIDE REVIEW

## 2023-05-10 NOTE — Unmapped (Signed)
 Assessment:  LEFT staghorn calculus. S/p LEFT PCNL Dow Adolph) 04/24/22. Complicated by gross hematuria, hydronephrosis and urosepsis.   LEFT upper quadrant abdominal pain.    -Felt to be due to symptomatic splenomegaly. This has resolved since starting new medication for her myelofibrosis.   Recurrent UTI's. Currently on Doxy ppx x 90 days.   -Tolerating medication well with no SE's. She has not had any infection since her last visit. Discussed that I recommend she complete the full 90 days of treatment and then we will monitor her off the abx.   Uric acid nephrolithiasis.   -No nephrolithiasis seen on CT abd/pelvis done 01/14/23.   Mixed LUTs.   N/V, biliary colic.   -Cholecystectomy postponed d/t other health concerns.    Splenomegaly.   CHF. Follows with [facility].   CKD3. Follows with PCP.   DM  Thrombocytopenia (severe).   Chronic lumbar back pain. Managed by PCP.   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 Follows with [facility]. S/p bone marrow biopsy 02/21/23. On Momelotinib.   OSA    Plan:  -Complete course of Doxycycline. After 90 days we will discontinue the medication.   -Increase water intake.   -Notify the clinic if you develop UTI symptoms. Do not treat for a UTI without a culture.     RTO 6 months for symptom check or sooner if needed.     Emily Walter, MPAP, PA-C  Reconstructive Urology at [facility], [place]   05/11/23  Phone: [phone]  Pager: [phone]      Referring Provider: Dr. Dow Adolph     HPI:   73 y.o. female     Patient with above history who was initially seen by Dr. Dow Adolph on 03/10/2022 d/t L staghorn calculus. Decision was made to proceed with L PCNL which was done on 04/24/2022. Stent with string was to be removed on POD#5. She then experienced a possible choking event POD#0 and was transferred to ICU. She required transfer to [facility] from [facility] on 3/20 d/t ICU status and pressor requirements along with concern of post-operative bleeding. She did not require embolization after transfer. She was discharged home on 04/28/22.     She then presented to OSH on 3/27 after removing her ureteral stent at home. She became febrile to 102F. At the OSH she became tachycardic and hypotensive. She was transferred to [facility] for further care on 3/28. She was noted to have L hydronephrosis on imaging at that time so a L ureteral stent was placed on 3/28 (found to have clot in the collecting system). She continued to have gross hematuria during her hospital stay. Angiogram was done with [facility] on 4/6 but no active bleeding was noted. She did not require embolization. She ultimately passed TOV and had her ureteral stent removed at the bedside prior to discharge on 4/10.     Interval history (06/15/22):  She is here today for follow-up after recent PCNL.   She reports doing well.   Denies any flank pain, fever, chills, gross hematuria or dysuria.     Stone analysis: 100% Uric Acid  Aerobic culture: No growth     Renal US results pending.     Did not complete LithoLink study.   States that she has cut back to only drinking one pepsi a day.     Interval history (12/27/22):  Here for 6 month follow-up for nephrolithiasis.   Has not passed any stones since her last visit.   Denies any gross hematuria.  States she is having LEFT flank pain today.   She has had at least 2 culture proven UTI's (E. Coli) since last visit.   With each episode she develops L flank pain.   She uses premarin cream that is prescribed by her PCP.     Renal US done 12/18/22 showed only a 4 mm stone in RLP with no hydronephrosis.     She also reports ongoing biliary colic and n/v.   She is scheduled to have a cholecystectomy in January.   Requesting something for nausea today.     Denies fever, chills or dysuria.     Interval history (02/14/23):  Here for follow-up on recurrent UTI's and left flank pain.   Urine culture at her last visit showed 10-50K Enterococcus and she was treated with a course of abx.   Otherwise she has not had an infection since her last visit.   She was seen in the ER on 12/8 for constipation. UA at that time was negative for infection. CT abd/pelvis was done which showed asymmetric left renal pelvis urothelial thickening and mild surrounding stranding unchanged likely related to prior inflammation from staghorn calculi. No hydronephrosis or ureteral stones noted.     She continues to report having LEFT sided pain. It starts in the LUQ and radiates around to the flank. No associated n/v, gross hematuria, dysuria, fever or chills.   Husband states that she had imaging done with her pain doctor in [place] last month which showed several bulging discs in her spine. They are wanting to do steroid injections after she has her gallbladder removed later this month.     No UTI symptoms reported today.     Interval history (05/10/23):  Patient presents today for follow-up of recurrent UTIs and left flank pain.  Per chart review she underwent a bone marrow biopsy in January which showed dysplastic progression of her primary myelofibrosis.  She was also found to have worsening splenomegaly with anemia.  This was felt to be the cause of her left-sided abdominal and flank pain.  She was started on momelotinib by hematology.  She states that since then she is feeling much better.  Her left-sided abdominal and flank pain have resolved.  She also has much more energy.    She was started on doxycycline daily for 90 days at her last visit currently denies.  States that she has not had any infections since her last visit.  She is tolerating the medication well.    She denies any gross hematuria, dysuria, fever, chills, nausea or vomiting.     Her cholecystectomy has been postponed due to her other medical issues.    Past history reviewed and unchanged    ROS:   A comprehensive 10-system review was negative, except as noted in HPI.  The patient was asked to review all abnormal responses not pertinent to today's visit with their primary care physician.    BP 138/71  - Pulse 88  - Temp 36 ??C (96.8 ??F) (Temporal)  - Resp 20  - SpO2 95%     Physical Exam:  General: well developed, well nourished, no acute distress  HEENT: PERLA, EOM intact, normocephalic, atraumatic  Neck: supple and no masses  Chest: symmetrical  Lungs: non-labored breathing  Heart: normal rhythm, no JVD  Abdomen: no tenderness, no masses or hernias, no palpable organomegaly  GU: voiding spontaneously  Extremities: no deformities, no edema, no cyanosis

## 2023-05-11 DIAGNOSIS — D7581 Myelofibrosis: Principal | ICD-10-CM

## 2023-05-15 ENCOUNTER — Ambulatory Visit: Admit: 2023-05-15 | Discharge: 2023-05-16 | Attending: Adult Health | Primary: Adult Health

## 2023-05-15 DIAGNOSIS — D7581 Myelofibrosis: Principal | ICD-10-CM

## 2023-05-15 DIAGNOSIS — D509 Iron deficiency anemia, unspecified: Principal | ICD-10-CM

## 2023-05-15 NOTE — Unmapped (Signed)
 Myeloproliferative Neoplasms Clinic Follow up Visit    Patient: Emily Walter  MRN: [id number]  DOB: Oct 28, 1950  Date of Visit: 05/15/2023      This patient visit was completed through the use of an audio/video or telephone encounter.    I spent 10 minutes on the phone with the patient. I spent an additional 15 minutes on pre- and post-visit activities.     The patient was physically located in [place]  or a state in which I am permitted to provide care. The patient understood that s/he may incur co-pays and cost sharing, and agreed to the telemedicine visit. The visit was completed via phone and/or video, which was appropriate and reasonable under the circumstances given the patient's presentation at the time.    The patient has been advised of the potential risks and limitations of this mode of treatment (including, but not limited to, the absence of in-person examination) and has agreed to be treated using telemedicine. The patient's/patient's family's questions regarding telemedicine have been answered.     If the phone/video visit was completed in an ambulatory setting, the patient has also been advised to contact their provider???s office for worsening conditions, and seek emergency medical treatment and/or call 911 if the patient deems either necessary.    Visit conducted by: Telephone  Person contacted: Arita Belch  Contact phone number: [phone]  Is there someone else in the room? no         Reason for Visit   Emily Walter is a 73 y.o. with HFpEF, OSA, DM2, hypertension, CKD 3 (eGFR 32ml/min), staghorn calculi, chronic back pain, and triple negative primary myelofibrosis (higher risk) diagnosed 07/2016 who presents today in early follow up for progressive cytopenias prompting a bone marrow biopsy on 03/03/23.       Interval History   Since her visit with me a month ago, Emily Walter continues on momelotinib for triple-negative primary myelofibrosis.     History of Present Illness  She feels significantly better since her last visit, engaging in daily activities such as cooking two meals a day, washing dishes, and shopping. She is planning a trip to [facility] next Monday.    Her hemoglobin levels have normalized, which is a notable improvement as they have not been normal for a year. She expresses relief and happiness about this change.    She continues to experience low platelet counts but has no bleeding or melena.    Her white blood cell count has decreased slightly, which is a positive trend compared to previous levels in March.    No pain in her left side, indicating an improvement in her spleen-related symptoms, as she has been pain-free for about a month.    She is currently taking Ojjaara  and continues to follow her medication regimen without any changes.      Review of Systems   10-systems otherwise reviewed and negative except as per Interval History.    Assessment   #1 Primary myelofibrosis, intermediate-2 by DIPSS-Plus, High-risk by MIPSS70-Plus - with dysplastic progression 02/2023  #2 Thrombocytopenia - severe, plts 18 -  thought secondary to #1  #3 Anemia - Multifactorial, secondary to iron  deficiency, #1, CKD.  Hgb 9.9g/dL, not requiring transfusions.    Emily Walter is doing very well on momelotinib.  She continues to improve symptomatically.  Her anemia is improving and her hgb is now normal.  Platelets remain normal, but she does not have any bleeding.  She should continue momelotinib.    #  4 Splenomegaly - spleen 19.1cm on CT A/P from 01/14/23; was 15.6cm on  05/2022 and normal in 2020  Secondary to myelofibrosis. Abdominal pain on the left side has improved but remains intermittent. Ojjaara  is expected to reduce spleen size over time.  - Monitor abdominal pain and splenomegaly symptoms.  - Assess spleen size during the in-person clinic visit in four weeks.    #5 Chronic lumbar back pain - managed by PCP  #6 Lucency of right shoulder on right shoulder X-ray from 08/10/2021- SPEP/IFE obtained 03/12/23. Faint band suspicious for monoclonal component typed as IgM Lambda. Concentration of possible monoclonal protein cannot be accurately quantified due to the polyclonal background.   #7 HFpEF - no LE edema, stable symptoms - established with [facility]  #8 Staghorn renal calculi - follows with [facility]  #9 CKD3 - eGFR 56ml/min - follows with PCP  #10 Dyspnea and throat pain on exertion - pt had nuclear stress test 03/08/23 - results pending - followed by PCP  #11 Depression - on sertraline  25mg  po daily - managed by PCP  #12 Iron  deficiency- ferritin 140, iron  35, iron  sat 14% on 12/18/2022.  Last received Infed  1g on 07/14/22.  Iron  infusion last month improved hemoglobin levels. Continued monitoring of blood counts is necessary to assess treatment response.  - Monitor hemoglobin levels with monthly lab work.          Plan   Continue momelotinib 100mg  po daily  Dose reduced by 50% due to plts 18, baseline CKD (eGFR 27ml/min). Plan to increase if tolerates.   Indication = PMF with symptomatic splenomegaly + anemia  Monitoring = Local labs at [facility].  CBC-D, CMP - standing orders placed for q2 weeks for now given severe thrombocytopenia.   Avoid aspirin/NSAIDs due to severe thrombocytopenia  Follow up in person in 4 weeks    Artis Binder, RN, MSN, AGPCNP-C  Nurse Practitioner  Hematologic Malignancies  [facility]  05/15/2023        History of Present Illness     Hematology/Oncology History   MDS/MPN (myelodysplastic/myeloproliferative neoplasms)   08/03/2016 Initial Diagnosis    PRIMARY MYELOFIBROSIS vs MDS/MPN with fibrosis    Prior labs show:  2014 at [facility]: normal CBC, WBC 6.5, Hgb 15.2, Plts 181.    CBC from 08/03/2016: WBC 10.3 x 10^9/L, Hgb 12.3 g/dL, MCV 85, plt 52 x 16^1/W.    Due to the cytopenias, Emily Walter was sent to Dr. Ivie Maroon and underwent a bone marrow biopsy. I currently do not have bone marrow slides to review.  Outside [facility] report from 08/03/2016 states,  Hypercellular bone marrow (85%) with hyperplastic hematopoiesis.  Atypical megakaryocyte hyperplasia with focal clustering.  Abnormal localization of immature myeloid precursors.  No significant increase in blasts.  Mild to moderate reticulin fibrosis.    Driver mutation = SRSF2 (9604)  Karyotype = 46,XX,add(4)(q33),add(17)(q21)[13]     Higher-risk disease, with thrombocytopenia, SRSF2 mutation.      She was not an optimal alloHSCT candidate due to multiple co-morbidities.      She trialed ruxolitinib  but did not have a good response.     07/14/2022:  Received Infed  1g IV for iron  deficiency.     02/21/2023 Progression    MYELODYSPLASTIC PROGRESSION     She has been on observation alone for several years.  She now has progressive cytopenias, with platelets trending down to teens from 40-50's in April 2024 and hgb trending down from 11-12 g/dL in [facility] 5409  to 9.9 g/dL now.  WBC has progressively increased, was 10 in 05/2022 and now consistently >15.  Myeloid mutation panel with persistent SRSF2 mutation and new ASXL1 mutation with VAF 5%. In this context, a bone marrow biopsy was performed at [facility] on 02/21/23.  This showed persistent myelofibrosis with dysplastic features, consistent with MDS/MPN or perhaps MDS with myelofibrosis.      Karyotype is pending, but prior karyotype 46,XX with add(4) and add(17).       The bone marrow now appears most consistent with MDS.    For prognostic calculations, I used labs from 02/21/23: ANC 15, hgb 9.9, plts 18, bm blasts 2%, age 73yo, intermediate karyotype, SRSF2, ASXL1.      IPSS:  Int-1 (1 pt)  IPSS-R: Intermediate. (4pt) 88yr median overall survival.  25% AML transformation 3.2 yrs.   IPSS-M: Moderate-High. Leukemia-free survival 2.3 yrs. Overall survival 2.8 yrs.      03/11/2023 -  Chemotherapy    MOMELOTINIB    Indication = primary myelofibrosis with anemia   Starting dose = 100mg  po daily (reduced for platelet count 18)    03/12/23: WBC 24.7, Hgb 10, Plts 18. sCr 1.34.   04/09/23: WBC 26.6, Hgb 10.6, Plts 17.  sCr 1.34. 4% myelocytes, 4% metamyelocytes.              Medications     Current Outpatient Medications   Medication Sig Dispense Refill    cholecalciferol, vitamin D3-50 mcg, 2,000 unit,, 50 mcg (2,000 unit) tablet Take 1 tablet (50 mcg total) by mouth in the morning.      diclofenac sodium (VOLTAREN) 1 % gel Apply 4 g topically 4 (four) times a day as needed for arthritis or pain (Knee pain). 100 g 2    doxycycline (VIBRAMYCIN) 100 MG capsule Take 1 capsule (100 mg total) by mouth daily. 90 capsule 0    furosemide (LASIX) 40 MG tablet Take 1 tablet (40 mg total) by mouth daily. 90 tablet 3    levocetirizine (XYZAL) 5 MG tablet Take 1 tablet (5 mg total) by mouth every evening. 90 tablet 3    lidocaine (LIDODERM) 5 % patch Place 1 patch on the skin daily. Apply to affected area for 12 hours only each day (then remove patch)      lovastatin (MEVACOR) 40 MG tablet Take 1 tablet (40 mg total) by mouth daily. 90 tablet 3    momelotinib (OJJAARA) 100 mg tablet Take 1 tablet (100 mg total) by mouth daily. Swallow tablets whole; do not cut, crush, or chew. 30 tablet 2    omeprazole (PRILOSEC) 40 MG capsule Take 1 capsule (40 mg total) by mouth daily. 90 capsule 3    oxyCODONE (ROXICODONE) 10 mg immediate release tablet Take 1 tablet (10 mg total) by mouth every four (4) hours as needed for pain. 20 tablet 0    sertraline (ZOLOFT) 25 MG tablet Take 1 tablet (25 mg total) by mouth daily. 90 tablet 3    spironolactone (ALDACTONE) 25 MG tablet Take 1 tablet (25 mg total) by mouth daily. 90 tablet 2    albuterol HFA 90 mcg/actuation inhaler Inhale 2 puffs every six (6) hours as needed for wheezing or shortness of breath. (Patient not taking: Reported on 05/15/2023) 8 g 3    conjugated estrogens (PREMARIN) 0.625 mg/gram vaginal cream Insert 0.5 g into the vagina Two (2) times a week. (Patient not taking: Reported on 05/15/2023) 30 g 1    enalapril (VASOTEC) 5 MG tablet TAKE  1 TABLET EVERY DAY (Patient not taking: Reported on 05/15/2023) 90 tablet 3    fluticasone propionate (FLONASE) 50 mcg/actuation nasal spray 2 sprays into each nostril daily as needed for allergies. (Patient not taking: Reported on 05/15/2023)      miscellaneous medical supply Misc Rollator for daily use with ambulation dx: M48.061      NARCAN 4 mg/actuation nasal spray 1 spray into alternating nostrils once as needed.  0    ondansetron  (ZOFRAN -ODT) 4 MG disintegrating tablet Dissolve 1 tablet (4 mg total) in the mouth every eight (8) hours as needed for nausea. 15 tablet 3    oxyCODONE -acetaminophen (PERCOCET) 10-325 mg per tablet TAKE 1 TABLET BY MOUTH THREE TIMES A DAY AS NEEDED FOR PAIN (Patient not taking: Reported on 05/15/2023)      polyethylene glycol (GOLYTELY ) 236-22.74-6.74 gram solution Take by mouth as directed per [facility] GI prep instructions, for split bowel prep. 4000 mL 0    rOPINIRole  (REQUIP ) 0.5 MG tablet Take 1 tablet (0.5 mg total) by mouth nightly. (Patient not taking: Reported on 05/15/2023) 30 tablet 11    ULTRA-LIGHT ROLLATOR Misc Use as directed.  0     No current facility-administered medications for this visit.       Allergies     Allergies   Allergen Reactions    Fentanyl Anaphylaxis    Codeine Other (See Comments)     Headache, nausea    Corticosteroids (Glucocorticoids) Headache     **ALL STEROIDS**    Gabapentin Other (See Comments)     oversedation    Prednisone  Headache    Pregabalin Rash     Swelling and general fatigue       Past Medical and Surgical History     Past Medical History:   Diagnosis Date    Anxiety     Arthritis     Asthma     Cancer     Cataract     Chronic kidney disease     Depression     Difficult intravenous access     GERD (gastroesophageal reflux disease)     Heart failure     HLD (hyperlipidemia)     Hypertension     Myelofibrosis     Nephrolithiasis     Sleep apnea     Tinnitus      Past Surgical History:   Procedure Laterality Date    HYSTERECTOMY      PR CYSTO/URETERO W/LITHOTRIPSY &INDWELL STENT INSRT Left 08/21/2018    Procedure: priority CYSTOURETHROSCOPY, WITH URETEROSCOPY AND/OR PYELOSCOPY; WITH LITHOTRIPSY INCLUDING INSERTION OF INDWELLING URETERAL STENT;  Surgeon: Jhon Moselle Viprakasit, MD;  Location: CYSTO PROCEDURE SUITES [facility];  Service: Urology    PR CYSTO/URETERO/PYELOSCOPY, CALCULUS TX Left 09/04/2018    Procedure: CYSTOURETHROSCOPY, W/URETEROSCOPY &/OR PYELOSCOPY; W/REMOVAL/MANIPULATION CALCULUS(URETERAL CATH INCLUDED);  Surgeon: Jhon Moselle Viprakasit, MD;  Location: CYSTO PROCEDURE SUITES [facility];  Service: Urology    PR CYSTOSCOPY,INSERT URETERAL STENT Left 05/04/2022    Procedure: CYSTOURETHROSCOPY,  WITH INSERTION OF INDWELLING URETERAL STENT (EG, GIBBONS OR DOUBLE-J TYPE);  Surgeon: Elliott Guy, MD;  Location: MAIN OR [facility];  Service: Urology    PR CYSTOSCOPY,REMV CALCULUS,SIMPLE Left 09/04/2018    Procedure: CYSTOURETHROSCOPY, WITH REMOVAL OF FOREIGN BODY, CALCULUS OR URETERAL STENT FROM URETHRA OR BLADDER; SIMPLE;  Surgeon: Jhon Moselle Viprakasit, MD;  Location: CYSTO PROCEDURE SUITES [facility];  Service: Urology    PR CYSTOURETHROSCOPY,URETER CATHETER Left 08/21/2018    Procedure: CYSTOURETHROSCOPY, W/URETERAL CATHETERIZATION, W/WO IRRIG, INSTILL, OR URETEROPYELOG, EXCLUS OF RADIOLG  SVC;  Surgeon: Jhon Moselle Viprakasit, MD;  Location: CYSTO PROCEDURE SUITES [facility];  Service: Urology    PR CYSTOURETHROSCOPY,URETER CATHETER Left 09/04/2018    Procedure: CYSTOURETHROSCOPY, W/URETERAL CATHETERIZATION, W/WO IRRIG, INSTILL, OR URETEROPYELOG, EXCLUS OF RADIOLG SVC;  Surgeon: Jhon Moselle Viprakasit, MD;  Location: CYSTO PROCEDURE SUITES [facility];  Service: Urology    PR PERQ DILATION XST TRC NEW ACCESS RENAL COLTJ SYS Left 04/24/2022    Procedure: DILATION OF EXISTING TRACT, PERC, FOR ENDOUROLOGIC PROC INCL IMAGING GUIDANCE & RADIOLOGIC SUPERVISION & INTERPRET, W POSTPROC TUBE PLACEMENT, WHEN PERF; INCL NEW ACCESS INTO RENAL COLLECTING SYSTEM;  Surgeon: Derrill Flirt, MD;  Location: [facility] OR [facility];  Service: Urology    PR PERQ NL/PL LITHOTRP COMPLEX >2 CM MLT LOCATIONS Left 04/24/2022    Procedure: standard PCNL prone vs. standard; need fortec;  Surgeon: Derrill Flirt, MD;  [facility];  Service: Urology    PR REMOVE BLADDER STONE,<2.5 CM N/A 08/21/2018    Procedure: LITHOLAPAXY: CRUSH/FRAGMENT CALCULUS, BY ANY MEANS IN BLADDER & REMOVAL OF FRAGMENTS; SIMPLE/SMALL <2.5 CM;  Surgeon: Jhon Moselle Viprakasit, MD;  Location: CYSTO PROCEDURE SUITES [facility];  Service: Urology    SALPINGOOPHORECTOMY Right     12/07/2010    VAGINAL HYSTERECTOMY      12/07/2010       Social History     Social History     Socioeconomic History    Marital status: Married   Tobacco Use    Smoking status: Never    Smokeless tobacco: Never   Vaping Use    Vaping status: Never Used   Substance and Sexual Activity    Alcohol use: No    Drug use: No    Sexual activity: Not Currently     Social Drivers of Health     Financial Resource Strain: Low Risk  (04/25/2022)    Overall Financial Resource Strain (CARDIA)     Difficulty of Paying Living Expenses: Not hard at all   Food Insecurity: No Food Insecurity (04/25/2022)    Hunger Vital Sign     Worried About Running Out of Food in the Last Year: Never true     Ran Out of Food in the Last Year: Never true   Transportation Needs: No Transportation Needs (04/25/2022)    PRAPARE - Therapist, art (Medical): No     Lack of Transportation (Non-Medical): No   Housing: Low Risk  (04/25/2022)    Housing     Within the past 12 months, have you ever stayed: outside, in a car, in a tent, in an overnight shelter, or temporarily in someone else's home (i.e. 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  Exposed to 2nd hand smoke; she is a lifelong non-smoker.  Went on disability in 06/07/04 d/t back and has worsened this year.      Enjoys traveling.  Married - husband is widowed.      Family History   Two sons - the youngest died in 08-Jun-2011 at the age of 46 from bladder cancer.     Physical Examination     There were no vitals filed for this visit.      GENERAL: Appears tired but in no acute distress.  Accompanied by her husband.  HEENT: No scleral icterus.  No periorbital purpura.  No macroglossia.  RESP: CTAB.  Base  of lungs is clear to auscultation bilaterally.  CV: RRR.  No murmurs rubs or gallops appreciated.  ABD: Limited by seated position and body habitus.  NEURO: A&Ox4, CN observed are WNL  PSYCH: Normal affect, affect is full and congruent.  No psychomotor agitation nor retardation.  Thought process is goal-directed and linear.      Laboratory Testing and Imaging     Results for orders placed or performed in visit on 05/10/23   Comprehensive Metabolic Panel   Result Value Ref Range    Sodium 142 135 - 145 mmol/L    Potassium 4.5 3.4 - 4.8 mmol/L    Chloride 97 (L) 98 - 107 mmol/L    CO2 34.4 (H) 20.0 - 31.0 mmol/L    Anion Gap 11 5 - 14 mmol/L    BUN 26 (H) 9 - 23 mg/dL    Creatinine 9.60 (H) 0.55 - 1.02 mg/dL    BUN/Creatinine Ratio 17     eGFR CKD-EPI (2021) Female 34 (L) >=60 mL/min/1.60m2    Glucose 129 70 - 179 mg/dL    Calcium 9.6 8.7 - 45.4 mg/dL    Albumin 4.0 3.4 - 5.0 g/dL    Total Protein 8.1 5.7 - 8.2 g/dL    Total Bilirubin 1.1 0.3 - 1.2 mg/dL    AST 21 <=09 U/L    ALT <7 (L) 10 - 49 U/L    Alkaline Phosphatase 90 46 - 116 U/L   Lactate dehydrogenase   Result Value Ref Range    LDH 683 (H) 120 - 246 U/L   Ferritin   Result Value Ref Range    Ferritin 233.4 7.3 - 270.7 ng/mL   Iron  & TIBC   Result Value Ref Range    Iron  35 (L) 50 - 170 ug/dL    TIBC 811.9 (L) 147.8 - 425.0 ug/dL    Transferrin 295.6 (L) 250.0 - 380.0 mg/dL    Iron  Saturation (%) 19 %   CBC w/ Differential   Result Value Ref Range    WBC 21.8 (H) 3.6 - 11.2 10*9/L    RBC 4.39 3.95 - 5.13 10*12/L    HGB 11.6 11.3 - 14.9 g/dL    HCT 21.3 08.6 - 57.8 %    MCV 83.2 77.6 - 95.7 fL    MCH 26.5 25.9 - 32.4 pg    MCHC 31.9 (L) 32.0 - 36.0 g/dL    RDW 46.9 (H) 62.9 - 15.2 %    MPV 9.8 6.8 - 10.7 fL    Platelet 18 (L) 150 - 450 10*9/L    nRBC 1 <=4 /100 WBCs    Neutrophils % 77.4 %    Lymphocytes % 14.1 %    Monocytes % 1.4 %    Eosinophils % 3.9 %    Basophils % 3.2 %    Absolute Neutrophils 16.9 (H) 1.8 - 7.8 10*9/L    Absolute Lymphocytes 3.1 1.1 - 3.6 10*9/L    Absolute Monocytes 0.3 0.3 - 0.8 10*9/L    Absolute Eosinophils 0.9 (H) 0.0 - 0.5 10*9/L    Absolute Basophils 0.7 (H) 0.0 - 0.1 10*9/L    Anisocytosis Marked (A) Not Present   Morphology Review   Result Value Ref Range    Smear Review Comments See Comment (A) Undefined    Polychromasia Slight (A) Not Present     *Note: Due to a large number of results and/or encounters for the requested time period, some results have not been displayed. A complete  set of results can be found in Results Review.

## 2023-05-17 ENCOUNTER — Ambulatory Visit: Admit: 2023-05-17 | Discharge: 2023-05-18 | Attending: Medical | Primary: Medical

## 2023-05-17 ENCOUNTER — Ambulatory Visit: Admit: 2023-05-17 | Discharge: 2023-05-18

## 2023-05-17 DIAGNOSIS — I10 Essential (primary) hypertension: Principal | ICD-10-CM

## 2023-05-17 DIAGNOSIS — N1831 Stage 3a chronic kidney disease (CMS-HCC): Principal | ICD-10-CM

## 2023-05-17 DIAGNOSIS — N390 Urinary tract infection, site not specified: Principal | ICD-10-CM

## 2023-05-17 NOTE — Unmapped (Signed)
 Internal Medicine Phone Visit    This visit is conducted via telephone.    Contact Information  Person Contacted: Patient  Contact Phone number: [phone] (home) [phone] (work)  Is there someone else in the room? No.   Patient agreed to a phone visit    Emily Walter is a 73 y.o. female  participating in a telephone encounter.    Reason for Call  Follow up    Subjective:    Last visit w/ me 1/9. Saw urology 4/3, had virtual visit w/ onc 4/8.    No new issues    No more abdominal pain, or side pain for at least 1 month    Finished doxycycline two days ago  No new urinary sx    Hasn't been checking BP at home  No LH. Ongoing dizzy spells, hasn't worsened.    Tried requip, made her sleep so she stopped it   Not really having restless legs anymore    I have reviewed the problem list, medications, and allergies and have updated/reconciled them if needed.    Objective:  Sounds well in NAD    Assessment & Plan:  1. White coat syndrome with diagnosis of hypertension    2. Stage 3a chronic kidney disease (CMS-HCC)    3. Recurrent UTI           # White coat HTN with HTN  BP has been normal off of enalapril for months.  - continue spironolactone, lasix    # Stage 3A CKD  Cr has been stable 1.3-1.5.  - urine albumin/cr ratio ordered    # Recurrent UTI  Just completed 3 months of doxycycline. Follows w/ urology.  - monitor for recurrence of sx      Return in about 7 months (around 12/17/2023).           The patient reports they are physically located in [place]  and is currently: at home. I conducted a phone visit.  I spent 7 minutes on the phone call with the patient on the date of service .

## 2023-05-18 NOTE — Unmapped (Signed)
 Called/spoke with patient to schedule 7 month follow up appointment.

## 2023-05-19 DIAGNOSIS — D7581 Myelofibrosis: Principal | ICD-10-CM

## 2023-05-20 DIAGNOSIS — D7581 Myelofibrosis: Principal | ICD-10-CM

## 2023-05-21 DIAGNOSIS — D7581 Myelofibrosis: Principal | ICD-10-CM

## 2023-05-21 NOTE — Unmapped (Signed)
 [facility] Delivery Pharm[facility] Refill Coordination Note    Specialty Medication(s) to be Shipped:   Hematology/Oncology: OJJAARA  100 mg tablet (momelotinib)    Other medication(s) to be shipped: No additional medications requested for fill at this time     Emily Walter, DOB: [birth year]  Phone: [phone] (home) [phone] (work)      All above HIPAA information was verified with patient.     Was a Nurse, learning disability used for this call? No    Completed refill call assessment today to schedule patient's medication shipment from the [facility]  [phone]).  All relevant notes have been reviewed.     Specialty medication(s) and dose(s) confirmed: Regimen is correct and unchanged.   Changes to medications: Emily Walter reports no changes at this time.  Changes to insurance: No  New side effects reported not previously addressed with a pharmacist or physician: None reported  Questions for the pharmacist: No    Confirmed patient received a Conservation officer, historic buildings and a Surveyor, mining with first shipment. The patient will receive a drug information handout for each medication shipped and additional FDA Medication Guides as required.       DISEASE/MEDICATION-SPECIFIC INFORMATION        N/A    SPECIALTY MEDICATION ADHERENCE     Medication Adherence    Specialty Medication: OJJAARA  100 mg tablet (momelotinib)  Patient is on additional specialty medications: No              Were doses missed due to medication being on hold? No    OJJAARA  100 mg tablet (momelotinib): 14 days of medicine on hand       REFERRAL TO PHARMACIST     Referral to the pharmacist: Not needed      Brand [facility]     Shipping address confirmed in Epic.     Cost and Payment: Patient has a $0 copay, payment information is not required.    Delivery Scheduled: Yes, Expected medication delivery date: 06/01/23.     Medication will be delivered via UPS to the prescription address in Epic WAM.    Eulogio Requena   [facility]  Specialty Technician

## 2023-05-31 MED FILL — OJJAARA 100 MG TABLET: ORAL | 30 days supply | Qty: 30 | Fill #2

## 2023-06-04 DIAGNOSIS — D7581 Myelofibrosis: Principal | ICD-10-CM

## 2023-06-04 NOTE — Unmapped (Signed)
 AOC Triage Note     Patient: Emily Walter     Reason for call:  return call    Time call returned: 0927    Goal for this communication: question about lab frequency     Phone Assessment: pt unsure of how often she is to be having labs drawn     Triage Recommendations: RN advised that per Becky's last note, she is to have blood drawn every two weeks and that there are standing orders at [facility].  Pt states understanding       Approx minutes spent reviewing chart, calling pt and documentation of call:  10

## 2023-06-04 NOTE — Unmapped (Signed)
 [facility] Other Call ONC Phone Room Smart Lists: General -     Hi,     Emily Walter contacted the Communication Center regarding the following:    - Requested to speak with a member of the care team. Stated that they were told to get lab work done other than the 06/18/23 appointment due to a medication that was delivered, but they forgot the specifics.    Please contact [name] at [phone].    Thanks in advance,    [name]  [facility]   [phone]    [facility]

## 2023-06-06 LAB — COMPREHENSIVE METABOLIC PANEL
ALBUMIN: 4.3 g/dL (ref 3.8–4.8)
ALKALINE PHOSPHATASE: 89 IU/L (ref 44–121)
ALT (SGPT): 10 IU/L (ref 0–32)
AST (SGOT): 22 IU/L (ref 0–40)
BILIRUBIN TOTAL (MG/DL) IN SER/PLAS: 0.8 mg/dL (ref 0.0–1.2)
BLOOD UREA NITROGEN: 24 mg/dL (ref 8–27)
BUN / CREAT RATIO: 17 (ref 12–28)
CALCIUM: 8.7 mg/dL (ref 8.7–10.3)
CHLORIDE: 99 mmol/L (ref 96–106)
CO2: 21 mmol/L (ref 20–29)
CREATININE: 1.4 mg/dL — ABNORMAL HIGH (ref 0.57–1.00)
EGFR: 39 mL/min/{1.73_m2} — ABNORMAL LOW
GLOBULIN, TOTAL: 2.9 g/dL (ref 1.5–4.5)
GLUCOSE: 122 mg/dL — ABNORMAL HIGH (ref 70–99)
POTASSIUM: 4 mmol/L (ref 3.5–5.2)
SODIUM: 141 mmol/L (ref 134–144)
TOTAL PROTEIN: 7.2 g/dL (ref 6.0–8.5)

## 2023-06-06 LAB — CBC W/ DIFFERENTIAL
BASOPHILS ABSOLUTE COUNT: 1.3 10*3/uL — ABNORMAL HIGH (ref 0.0–0.2)
BASOPHILS RELATIVE PERCENT: 6 %
EOSINOPHILS ABSOLUTE COUNT: 1.5 10*3/uL — ABNORMAL HIGH (ref 0.0–0.4)
EOSINOPHILS RELATIVE PERCENT: 7 %
HEMATOCRIT: 37.8 % (ref 34.0–46.6)
HEMOGLOBIN: 11.8 g/dL (ref 11.1–15.9)
LYMPHOCYTES ABSOLUTE COUNT: 2.8 10*3/uL (ref 0.7–3.1)
LYMPHOCYTES RELATIVE PERCENT: 13 %
MEAN CORPUSCULAR HEMOGLOBIN CONC: 31.2 g/dL — ABNORMAL LOW (ref 31.5–35.7)
MEAN CORPUSCULAR HEMOGLOBIN: 27.5 pg (ref 26.6–33.0)
MEAN CORPUSCULAR VOLUME: 88 fL (ref 79–97)
MONOCYTES ABSOLUTE COUNT: 0.8 10*3/uL (ref 0.1–0.9)
MONOCYTES RELATIVE PERCENT: 4 %
NEUTROPHILS ABSOLUTE COUNT: 11.4 10*3/uL — ABNORMAL HIGH (ref 1.4–7.0)
NEUTROPHILS RELATIVE PERCENT: 54 %
NUCLEATED RED BLOOD CELLS: 4 % — ABNORMAL HIGH (ref 0–0)
PLATELET COUNT: 15 10*3/uL — CL (ref 150–450)
RED BLOOD CELL COUNT: 4.29 x10E6/uL (ref 3.77–5.28)
RED CELL DISTRIBUTION WIDTH: 19.3 % — ABNORMAL HIGH (ref 11.7–15.4)
WHITE BLOOD CELL COUNT: 21.2 10*3/uL (ref 3.4–10.8)

## 2023-06-06 LAB — IMMATURE CELLS
METAMYLOCYTES-LABCORP: 7 % — ABNORMAL HIGH (ref 0–0)
MYELOCYTES: 9 % — ABNORMAL HIGH (ref 0–0)

## 2023-06-14 DIAGNOSIS — D7581 Myelofibrosis: Principal | ICD-10-CM

## 2023-06-14 NOTE — Unmapped (Signed)
 [facility] Other Call ONC Phone Room Smart Lists: MD Call/Provider-to-Provider -     Corrin Dimes,    Dr. Quentin Brunner w/ [facility] has contacted the Communication Center requesting to speak with you directly regarding the following:    States that the patient was admitted into the [place] ED un expectantly and he would like to speak with you about the care that she is getting here, states that he cannot get her records     Dr. Johnn Najjar is available for a call back anytime at [phone]      A page has also been sent.    Thank you,  Gwenevere Lent  [facility]  [phone]

## 2023-06-14 NOTE — Unmapped (Signed)
Acknowledged, thanks.

## 2023-06-14 NOTE — Unmapped (Signed)
 Returned call to Dr. Sharin David, resident at [facility].    He reports that Emily Walter presented to the [place] with her husband for his appointment.  She became presyncopal, flushed, dizzy and was admitted to the ER.  Complained of flank pain which is not new for her.  Has not completed UA yet, CT pending.    WBC 28, hgb 11.2, plt 15, ANC 16.2 (similar to labs done here)    Discussed current therapy with momelotinib and that labs are similar to previous.  She seems to have responded well to fluids and zofran.  Reviewed CT from 01/2023 as well as pertinent UA/UC results with him.  Will have our coordinator send records to [facility].     They have not decided whether or not they will admit her, but will keep me updated throughout the day.    Fortunately, Emily Walter has an appointment scheduled with me on Monday, but will also request PCP follow up.

## 2023-06-18 ENCOUNTER — Ambulatory Visit
Admit: 2023-06-18 | Discharge: 2023-06-19 | Payer: Medicare (Managed Care) | Attending: Adult Health | Primary: Adult Health

## 2023-06-18 ENCOUNTER — Other Ambulatory Visit: Admit: 2023-06-18 | Discharge: 2023-06-19 | Payer: Medicare (Managed Care)

## 2023-06-18 DIAGNOSIS — D7581 Myelofibrosis: Principal | ICD-10-CM

## 2023-06-18 DIAGNOSIS — D696 Thrombocytopenia, unspecified: Principal | ICD-10-CM

## 2023-06-18 DIAGNOSIS — N1831 Stage 3a chronic kidney disease (CMS-HCC): Principal | ICD-10-CM

## 2023-06-18 DIAGNOSIS — R161 Splenomegaly, not elsewhere classified: Principal | ICD-10-CM

## 2023-06-18 LAB — CBC W/ AUTO DIFF
BASOPHILS ABSOLUTE COUNT: 0.7 10*9/L — ABNORMAL HIGH (ref 0.0–0.1)
BASOPHILS RELATIVE PERCENT: 2.9 %
EOSINOPHILS ABSOLUTE COUNT: 0.8 10*9/L — ABNORMAL HIGH (ref 0.0–0.5)
EOSINOPHILS RELATIVE PERCENT: 3.4 %
HEMATOCRIT: 32.5 % — ABNORMAL LOW (ref 34.0–44.0)
HEMOGLOBIN: 10.4 g/dL — ABNORMAL LOW (ref 11.3–14.9)
LYMPHOCYTES ABSOLUTE COUNT: 2.7 10*9/L (ref 1.1–3.6)
LYMPHOCYTES RELATIVE PERCENT: 11 %
MEAN CORPUSCULAR HEMOGLOBIN CONC: 31.9 g/dL — ABNORMAL LOW (ref 32.0–36.0)
MEAN CORPUSCULAR HEMOGLOBIN: 26.9 pg (ref 25.9–32.4)
MEAN CORPUSCULAR VOLUME: 84.4 fL (ref 77.6–95.7)
MEAN PLATELET VOLUME: 9 fL (ref 6.8–10.7)
MONOCYTES ABSOLUTE COUNT: 0.6 10*9/L (ref 0.3–0.8)
MONOCYTES RELATIVE PERCENT: 2.6 %
NEUTROPHILS ABSOLUTE COUNT: 19.6 10*9/L — ABNORMAL HIGH (ref 1.8–7.8)
NEUTROPHILS RELATIVE PERCENT: 80.1 %
PLATELET COUNT: 13 10*9/L — ABNORMAL LOW (ref 150–450)
RED BLOOD CELL COUNT: 3.86 10*12/L — ABNORMAL LOW (ref 3.95–5.13)
RED CELL DISTRIBUTION WIDTH: 21.6 % — ABNORMAL HIGH (ref 12.2–15.2)
WBC ADJUSTED: 24.4 10*9/L — ABNORMAL HIGH (ref 3.6–11.2)

## 2023-06-18 LAB — COMPREHENSIVE METABOLIC PANEL
ALBUMIN: 3.7 g/dL (ref 3.4–5.0)
ALKALINE PHOSPHATASE: 84 U/L (ref 46–116)
ALT (SGPT): 8 U/L — ABNORMAL LOW (ref 10–49)
ANION GAP: 6 mmol/L (ref 5–14)
AST (SGOT): 19 U/L (ref <=34)
BILIRUBIN TOTAL: 1.4 mg/dL — ABNORMAL HIGH (ref 0.3–1.2)
BLOOD UREA NITROGEN: 18 mg/dL (ref 9–23)
BUN / CREAT RATIO: 12
CALCIUM: 9 mg/dL (ref 8.7–10.4)
CHLORIDE: 103 mmol/L (ref 98–107)
CO2: 33 mmol/L — ABNORMAL HIGH (ref 20.0–31.0)
CREATININE: 1.46 mg/dL — ABNORMAL HIGH (ref 0.55–1.02)
EGFR CKD-EPI (2021) FEMALE: 37 mL/min/1.73m2 — ABNORMAL LOW (ref >=60)
GLUCOSE RANDOM: 110 mg/dL (ref 70–179)
POTASSIUM: 4.4 mmol/L (ref 3.4–4.8)
PROTEIN TOTAL: 7.6 g/dL (ref 5.7–8.2)
SODIUM: 142 mmol/L (ref 135–145)

## 2023-06-18 LAB — LACTATE DEHYDROGENASE: LACTATE DEHYDROGENASE: 712 U/L — ABNORMAL HIGH (ref 120–246)

## 2023-06-18 LAB — SLIDE REVIEW

## 2023-06-18 NOTE — Unmapped (Addendum)
 Please get labs at [facility] on June 18th or so  Emily Cline do a phone visit on June 24th  In person visit in 3 months  Spleen ultrasound same day as your visit  Start your ojjaara ASAP    If ANYTHING changes between now and our visit, please call us ! [phone].

## 2023-06-18 NOTE — Unmapped (Signed)
 Myeloproliferative Neoplasms Clinic Follow up Visit    Patient: Emily Walter  MRN: [id number]  DOB: [birth year]  Date of Visit: 06/18/2023      Reason for Visit   Emily Walter is a 73 y.o. with HFpEF, OSA, DM2, hypertension, CKD 3 (eGFR 71ml/min), staghorn calculi, chronic back pain, and triple negative primary myelofibrosis (higher risk) diagnosed 07/2016 who presents today in early follow up for progressive cytopenias prompting a bone marrow biopsy on 03/03/23.  Momelotinib 100mg  daily since 03/11/23.      Interval History     History of Present Illness  Emily Walter is a 73 year old female with myelofibrosis who presents for routine follow up.     Seen in the [facility] ED on Wednesday when she was there with her husband and experienced a presyncopal event and vomiting.  After vomiting, she described a sensation where she was unable to swallow.    She notes worsening abdominal pain that began prior to her emergency room visit on Wednesday. The pain originates near her umbilicus and radiates to her spine, particularly in the splenic region. Similar to prior pain.      She developed blood blisters in her mouth last week and since she thought it was a side effect of the momelotinib, she stopped taking it but did not contact our office.  Since discontinuing, she notes increased bruising and decreased energy levels.    While on Ojjaara, she had increased energy and appetite, allowing her to engage in activities such as cooking.    No numbness or tingling in her extremities. She reports increased bruising, blood blisters in her mouth, difficulty swallowing, and nausea post-vomiting. She notes sediment in her urine, described as 'sand'. No new symptoms of numbness or tingling.      Review of Systems   10-systems otherwise reviewed and negative except as per Interval History.    Assessment   #1 Primary myelofibrosis, intermediate-2 by DIPSS-Plus, High-risk by MIPSS70-Plus - with dysplastic progression 02/2023  #2 Thrombocytopenia - severe, plts 18 -  thought secondary to #1  #3 Anemia - Multifactorial, secondary to iron deficiency, #1, CKD.  Hgb 9.9g/dL, not requiring transfusions.    Emily Walter is doing very well on momelotinib.  She continues to improve symptomatically.  Her anemia is improving and her hgb is now normal.  Platelets remain normal, but she does not have any bleeding.  She should continue momelotinib.    #4 Splenomegaly - spleen 19.1cm on CT A/P from 01/14/23; was 15.6cm on  05/2022 and normal in 2020  Splenomegaly with associated flank pain. The spleen size appears unchanged on imaging, but the pain may be due to rebound splenomegaly after stopping Momelotinib. The spleen is tender to palpation, and the pain radiates from the umbilicus to the spine. Restarting Momelotinib is expected to manage symptoms and potentially reduce spleen size.  Spleen measured 21cm on CT at ER last week.  About 14cm below LCM on exam though body habitus makes it difficult to accurately assess.    #5 Chronic lumbar back pain - managed by PCP  #6 Lucency of right shoulder on right shoulder X-ray from 08/10/2021- SPEP/IFE obtained 03/12/23. Faint band suspicious for monoclonal component typed as IgM Lambda. Concentration of possible monoclonal protein cannot be accurately quantified due to the polyclonal background.   #7 HFpEF - no LE edema, stable symptoms - established with [facility]  #8 Staghorn renal calculi - follows with [facility]  #9 CKD3 -  eGFR 38ml/min - follows with PCP  #10 Dyspnea and throat pain on exertion - pt had nuclear stress test 03/08/23 - results pending - followed by PCP  #11 Depression - on sertraline 25mg  po daily - managed by PCP  #12 Iron deficiency- ferritin 140, iron 35, iron sat 14% on 12/18/2022.  Received Infed 1g on 07/14/22, 03/30/23.  Iron infusion improved hemoglobin levels. Continued monitoring of blood counts is necessary to assess treatment response.  - Monitor hemoglobin levels with monthly lab work.      Plan   Resume momelotinib 100mg  po daily  Dose reduced by 50% due to plts 18, baseline CKD (eGFR 41ml/min). Plan to increase if tolerates.   Indication = PMF with symptomatic splenomegaly + anemia  Monitoring = Local labs at [facility].  CBC-D, CMP - standing orders   Avoid aspirin/NSAIDs due to severe thrombocytopenia  Spleen ultrasound in 3 months along with in person visit  Labs June 18th or so [facility])  Phone visit June 24th  Follow up in person in 3 months    Artis Binder, RN, MSN, AGPCNP-C  Nurse Practitioner  Hematologic Malignancies  [facility]  06/18/2023    I personally spent 50 minutes face-to-face and non-face-to-face in the care of this patient, which includes all pre, intra, and post visit time on the date of service. This time was spent in reviewing notes, labs and other test results in the chart, speaking with and examining the patient, communicating with other medical teams and documentation of the clinical encounter. All documented time was specific to the E/M visit and does not include any procedures that may have been performed.        History of Present Illness     Hematology/Oncology History   MDS/MPN (myelodysplastic/myeloproliferative neoplasms)     08/03/2016 Initial Diagnosis    PRIMARY MYELOFIBROSIS vs MDS/MPN with fibrosis    Prior labs show:  2014 at [facility]: normal CBC, WBC 6.5, Hgb 15.2, Plts 181.    CBC from 08/03/2016: WBC 10.3 x 10^9/L, Hgb 12.3 g/dL, MCV 85, plt 52 x 16^1/W.    Due to the cytopenias, Ms. Tobler was sent to Dr. Ivie Maroon and underwent a bone marrow biopsy. I currently do not have bone marrow slides to review.  Outside [facility] report from 08/03/2016 states,  Hypercellular bone marrow (85%) with hyperplastic hematopoiesis.  Atypical megakaryocyte hyperplasia with focal clustering.  Abnormal localization of immature myeloid precursors.  No significant increase in blasts.  Mild to moderate reticulin fibrosis.    Driver mutation = SRSF2 (2018)  Karyotype = 46,XX,add(4)(q33),add(17)(q21)[13]     Higher-risk disease, with thrombocytopenia, SRSF2 mutation.      She was not an optimal alloHSCT candidate due to multiple co-morbidities.      She trialed ruxolitinib but did not have a good response.     07/14/2022:  Received Infed 1g IV for iron deficiency.     02/21/2023 Progression    MYELODYSPLASTIC PROGRESSION     She has been on observation alone for several years.  She now has progressive cytopenias, with platelets trending down to teens from 40-50's in April 2024 and hgb trending down from 11-12 g/dL in Feb/March 2024 to 9.9 g/dL now.  WBC has progressively increased, was 10 in 05/2022 and now consistently >15.  Myeloid mutation panel with persistent SRSF2 mutation and new ASXL1 mutation with VAF 5%. In this context, a bone marrow biopsy was performed at [facility] on 02/21/23.  This showed persistent myelofibrosis with  dysplastic features, consistent with MDS/MPN or perhaps MDS with myelofibrosis.      Karyotype is pending, but prior karyotype 46,XX with add(4) and add(17).       The bone marrow now appears most consistent with MDS.    For prognostic calculations, I used labs from 02/21/23: ANC 15, hgb 9.9, plts 18, bm blasts 2%, age 21yo, intermediate karyotype, SRSF2, ASXL1.      IPSS:  Int-1 (1 pt)  IPSS-R: Intermediate. (4pt) 37yr median overall survival.  25% AML transformation 3.2 yrs.   IPSS-M: Moderate-High. Leukemia-free survival 2.3 yrs. Overall survival 2.8 yrs.      03/11/2023 -  Chemotherapy    MOMELOTINIB    Indication = primary myelofibrosis with anemia   Starting dose = 100mg  po daily (reduced for platelet count 18)    03/12/23: WBC 24.7, Hgb 10, Plts 18. sCr 1.34.   04/09/23: WBC 26.6, Hgb 10.6, Plts 17.  sCr 1.34. 4% myelocytes, 4% metamyelocytes.   05/10/23: WBC 21.8, hgb 11.6, plt 18  06/11/23: Patient stopped momelotinib without team's knowledge due to what she considered new side effects  06/18/23: WBC 24.4, hgb 10.4, plt 13, sCr 1.46 Medications     Current Outpatient Medications   Medication Sig Dispense Refill    albuterol HFA 90 mcg/actuation inhaler Inhale 2 puffs every six (6) hours as needed for wheezing or shortness of breath. 8 g 3    cholecalciferol, vitamin D3-50 mcg, 2,000 unit,, 50 mcg (2,000 unit) tablet Take 1 tablet (50 mcg total) by mouth in the morning.      conjugated estrogens (PREMARIN) 0.625 mg/gram vaginal cream Insert 0.5 g into the vagina Two (2) times a week. 30 g 1    diclofenac sodium (VOLTAREN) 1 % gel Apply 4 g topically 4 (four) times a day as needed for arthritis or pain (Knee pain). 100 g 2    fluticasone propionate (FLONASE) 50 mcg/actuation nasal spray 2 sprays into each nostril daily as needed for allergies.      furosemide (LASIX) 40 MG tablet Take 1 tablet (40 mg total) by mouth daily. 90 tablet 3    levocetirizine (XYZAL) 5 MG tablet Take 1 tablet (5 mg total) by mouth every evening. 90 tablet 3    lidocaine (LIDODERM) 5 % patch Place 1 patch on the skin daily. Apply to affected area for 12 hours only each day (then remove patch)      lovastatin (MEVACOR) 40 MG tablet Take 1 tablet (40 mg total) by mouth daily. 90 tablet 3    miscellaneous medical supply Misc Rollator for daily use with ambulation dx: M48.061      NARCAN 4 mg/actuation nasal spray 1 spray into alternating nostrils once as needed.  0    omeprazole (PRILOSEC) 40 MG capsule Take 1 capsule (40 mg total) by mouth daily. 90 capsule 3    ondansetron (ZOFRAN-ODT) 4 MG disintegrating tablet Dissolve 1 tablet (4 mg total) in the mouth every eight (8) hours as needed for nausea. 15 tablet 3    oxyCODONE (ROXICODONE) 10 mg immediate release tablet Take 1 tablet (10 mg total) by mouth every four (4) hours as needed for pain. 20 tablet 0    sertraline (ZOLOFT) 25 MG tablet Take 1 tablet (25 mg total) by mouth daily. 90 tablet 3    spironolactone (ALDACTONE) 25 MG tablet Take 1 tablet (25 mg total) by mouth daily. 90 tablet 2    ULTRA-LIGHT ROLLATOR Misc Use as directed.  0  momelotinib (OJJAARA) 100 mg tablet Take 1 tablet (100 mg total) by mouth daily. Swallow tablets whole; do not cut, crush, or chew. (Patient not taking: Reported on 06/18/2023) 30 tablet 2    polyethylene glycol (GOLYTELY) 236-22.74-6.74 gram solution Take by mouth as directed per [facility] GI prep instructions, for split bowel prep. (Patient not taking: Reported on 06/18/2023) 4000 mL 0     No current facility-administered medications for this visit.       Allergies     Allergies   Allergen Reactions    Fentanyl Anaphylaxis    Codeine Other (See Comments)     Headache, nausea    Corticosteroids (Glucocorticoids) Headache     **ALL STEROIDS**    Gabapentin Other (See Comments)     oversedation    Prednisone Headache    Pregabalin Rash     Swelling and general fatigue       Past Medical and Surgical History     Past Medical History:   Diagnosis Date    Anxiety     Arthritis     Asthma (HHS-HCC)     Cancer       Cataract     Chronic kidney disease     Depression     Difficult intravenous access     GERD (gastroesophageal reflux disease)     Heart failure       HLD (hyperlipidemia)     Hypertension     Myelofibrosis       Nephrolithiasis     Sleep apnea     Tinnitus      Past Surgical History:   Procedure Laterality Date    HYSTERECTOMY      PR CYSTO/URETERO W/LITHOTRIPSY &INDWELL STENT INSRT Left 08/21/2018    Procedure: priority CYSTOURETHROSCOPY, WITH URETEROSCOPY AND/OR PYELOSCOPY; WITH LITHOTRIPSY INCLUDING INSERTION OF INDWELLING URETERAL STENT;  Surgeon: Jhon Moselle Viprakasit, MD;  Location: CYSTO PROCEDURE SUITES [facility];  Service: Urology    PR CYSTO/URETERO/PYELOSCOPY, CALCULUS TX Left 09/04/2018    Procedure: CYSTOURETHROSCOPY, W/URETEROSCOPY &/OR PYELOSCOPY; W/REMOVAL/MANIPULATION CALCULUS(URETERAL CATH INCLUDED);  Surgeon: Jhon Moselle Viprakasit, MD;  Location: CYSTO PROCEDURE SUITES [facility];  Service: Urology    PR CYSTOSCOPY,INSERT URETERAL STENT Left 05/04/2022    Procedure: CYSTOURETHROSCOPY,  WITH INSERTION OF INDWELLING URETERAL STENT (EG, GIBBONS OR DOUBLE-J TYPE);  Surgeon: Elliott Guy, MD;  Location: MAIN OR [facility];  Service: Urology    PR CYSTOSCOPY,REMV CALCULUS,SIMPLE Left 09/04/2018    Procedure: CYSTOURETHROSCOPY, WITH REMOVAL OF FOREIGN BODY, CALCULUS OR URETERAL STENT FROM URETHRA OR BLADDER; SIMPLE;  Surgeon: Jhon Moselle Viprakasit, MD;  Location: CYSTO PROCEDURE SUITES [facility];  Service: Urology    PR CYSTOURETHROSCOPY,URETER CATHETER Left 08/21/2018    Procedure: CYSTOURETHROSCOPY, W/URETERAL CATHETERIZATION, W/WO IRRIG, INSTILL, OR URETEROPYELOG, EXCLUS OF RADIOLG SVC;  Surgeon: Jhon Moselle Viprakasit, MD;  Location: CYSTO PROCEDURE SUITES [facility];  Service: Urology    PR CYSTOURETHROSCOPY,URETER CATHETER Left 09/04/2018    Procedure: CYSTOURETHROSCOPY, W/URETERAL CATHETERIZATION, W/WO IRRIG, INSTILL, OR URETEROPYELOG, EXCLUS OF RADIOLG SVC;  Surgeon: Jhon Moselle Viprakasit, MD;  Location: CYSTO PROCEDURE SUITES [place];  Service: Urology    PR PERQ DILATION XST TRC NEW ACCESS RENAL COLTJ SYS Left 04/24/2022    Procedure: DILATION OF EXISTING TRACT, PERC, FOR ENDOUROLOGIC PROC INCL IMAGING GUIDANCE & RADIOLOGIC SUPERVISION & INTERPRET, W POSTPROC TUBE PLACEMENT, WHEN PERF; INCL NEW ACCESS INTO RENAL COLLECTING SYSTEM;  Surgeon: Derrill Flirt, MD;  [facility]: [facility] OR [facility];  Service: Urology    PR PERQ NL/PL LITHOTRP COMPLEX >2  CM MLT LOCATIONS Left 04/24/2022    Procedure: standard PCNL prone vs. standard; need fortec;  Surgeon: Derrill Flirt, MD;  Location: [facility];  Service: Urology    PR REMOVE BLADDER STONE,<2.5 CM N/A 08/21/2018    Procedure: LITHOLAPAXY: CRUSH/FRAGMENT CALCULUS, BY ANY MEANS IN BLADDER & REMOVAL OF FRAGMENTS; SIMPLE/SMALL <2.5 CM;  Surgeon: Jhon Moselle Viprakasit, MD;  Location: CYSTO PROCEDURE SUITES [facility];  Service: Urology    SALPINGOOPHORECTOMY Right     12/07/2010    VAGINAL HYSTERECTOMY      12/07/2010 Social History     Social History     Socioeconomic History    Marital status: Married   Tobacco Use    Smoking status: Never    Smokeless tobacco: Never   Vaping Use    Vaping status: Never Used   Substance and Sexual Activity    Alcohol use: No    Drug use: No    Sexual activity: Not Currently     Social Drivers of Health     Financial Resource Strain: Low Risk  (04/25/2022)    Overall Financial Resource Strain (CARDIA)     Difficulty of Paying Living Expenses: Not hard at all   Food Insecurity: No Food Insecurity (05/17/2023)    Hunger Vital Sign     Worried About Running Out of Food in the Last Year: Never true     Ran Out of Food in the Last Year: Never true   Transportation Needs: No Transportation Needs (05/17/2023)    PRAPARE - Therapist, art (Medical): No     Lack of Transportation (Non-Medical): No   Housing: Low Risk  (05/17/2023)    Housing     Within the past 12 months, have you ever stayed: outside, in a car, in a tent, in an overnight shelter, or temporarily in someone else's home (i.e. 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  Exposed to 2nd hand smoke; she is a lifelong non-smoker.  Went on disability in 07-19-04 d/t back and has worsened this year.      Enjoys traveling.  Married - husband is widowed.      Family History   Two sons - the youngest died in 07-20-11 at the age of 56 from bladder cancer.     Physical Examination     Vitals:    06/18/23 1321   BP: 140/53   Pulse: 76   Resp: 17   Temp: 36.7 ??C (98 ??F)   TempSrc: Temporal   SpO2: 95%   Weight: (!) 101.5 kg (223 lb 12.3 oz)         GENERAL: Appears tired but in no acute distress.  Accompanied by her husband.  HEENT: No scleral icterus.  No periorbital purpura.  RESP: CTAB.    CV: RRR.  No murmurs rubs or gallops appreciated.  ABD: BS present, splenomegaly to about 14cm, limited by body habitus.  NEURO: A&Ox4, CN observed are WNL  PSYCH: Normal affect, affect is full and congruent.  No psychomotor agitation nor retardation.  Thought process is goal-directed and linear.      Laboratory Testing and Imaging     Results for orders placed or performed in visit on 06/18/23   Comprehensive Metabolic Panel   Result Value Ref Range    Sodium 142 135 - 145 mmol/L    Potassium  4.4 3.4 - 4.8 mmol/L    Chloride 103 98 - 107 mmol/L    CO2 33.0 (H) 20.0 - 31.0 mmol/L    Anion Gap 6 5 - 14 mmol/L    BUN 18 9 - 23 mg/dL    Creatinine 1.61 (H) 0.55 - 1.02 mg/dL    BUN/Creatinine Ratio 12     eGFR CKD-EPI (2021) Female 37 (L) >=60 mL/min/1.40m2    Glucose 110 70 - 179 mg/dL    Calcium 9.0 8.7 - 09.6 mg/dL    Albumin 3.7 3.4 - 5.0 g/dL    Total Protein 7.6 5.7 - 8.2 g/dL    Total Bilirubin 1.4 (H) 0.3 - 1.2 mg/dL    AST 19 <=04 U/L    ALT 8 (L) 10 - 49 U/L    Alkaline Phosphatase 84 46 - 116 U/L   Lactate dehydrogenase   Result Value Ref Range    LDH 712 (H) 120 - 246 U/L   CBC w/ Differential   Result Value Ref Range    WBC 24.4 (H) 3.6 - 11.2 10*9/L    RBC 3.86 (L) 3.95 - 5.13 10*12/L    HGB 10.4 (L) 11.3 - 14.9 g/dL    HCT 54.0 (L) 98.1 - 44.0 %    MCV 84.4 77.6 - 95.7 fL    MCH 26.9 25.9 - 32.4 pg    MCHC 31.9 (L) 32.0 - 36.0 g/dL    RDW 19.1 (H) 47.8 - 15.2 %    MPV 9.0 6.8 - 10.7 fL    Platelet 13 (L) 150 - 450 10*9/L    Neutrophils % 80.1 %    Lymphocytes % 11.0 %    Monocytes % 2.6 %    Eosinophils % 3.4 %    Basophils % 2.9 %    Absolute Neutrophils 19.6 (H) 1.8 - 7.8 10*9/L    Absolute Lymphocytes 2.7 1.1 - 3.6 10*9/L    Absolute Monocytes 0.6 0.3 - 0.8 10*9/L    Absolute Eosinophils 0.8 (H) 0.0 - 0.5 10*9/L    Absolute Basophils 0.7 (H) 0.0 - 0.1 10*9/L    Anisocytosis Moderate (A) Not Present    Hypochromasia Slight (A) Not Present   Morphology Review   Result Value Ref Range    Smear Review Comments See Comment (A) Undefined    Burr Cells Present (A) Not Present    Neutrophil Left Shift Present (A) Not Present     *Note: Due to a large number of results and/or encounters for the requested time period, some results have not been displayed. A complete set of results can be found in Results Review.

## 2023-06-19 DIAGNOSIS — D7581 Myelofibrosis: Principal | ICD-10-CM

## 2023-06-21 DIAGNOSIS — D7581 Myelofibrosis: Principal | ICD-10-CM

## 2023-06-21 MED ORDER — MOMELOTINIB 100 MG TABLET
ORAL_TABLET | Freq: Every day | ORAL | 2 refills | 30.00000 days | Status: CP
Start: 2023-06-21 — End: ?
  Filled 2023-06-28: qty 30, 30d supply, fill #0

## 2023-06-21 NOTE — Unmapped (Signed)
 Pt is requesting refill    Most recent clinic visit: 06/18/2023  Next clinic visit:  07/31/2023

## 2023-06-21 NOTE — Unmapped (Signed)
 [facility] Refill Coordination Note    Specialty Medication(s) to be Shipped:   Hematology/Oncology: OJJAARA 100 mg tablet (momelotinib)    Other medication(s) to be shipped: No additional medications requested for fill at this time     Emily Walter, DOB: 29-Apr-1950  Phone: [phone] (home) [phone] (work)      All above HIPAA information was verified with patient.     Was a Nurse, learning disability used for this call? No    Completed refill call assessment today to schedule patient's medication shipment from the [facility]  [phone]).  All relevant notes have been reviewed.     Specialty medication(s) and dose(s) confirmed: Regimen is correct and unchanged.   Changes to medications: Pedro reports no changes at this time.  Changes to insurance: No  New side effects reported not previously addressed with a pharmacist or physician: None reported  Questions for the pharmacist: No    Confirmed patient received a Conservation officer, historic buildings and a Surveyor, mining with first shipment. The patient will receive a drug information handout for each medication shipped and additional FDA Medication Guides as required.       DISEASE/MEDICATION-SPECIFIC INFORMATION        N/A    SPECIALTY MEDICATION ADHERENCE     Medication Adherence    Patient reported X missed doses in the last month: 0  Specialty Medication: OJJAARA 100 mg tablet (momelotinib)  Patient is on additional specialty medications: No              Were doses missed due to medication being on hold? No    OJJAARA 100 mg tablet (momelotinib): 14 days of medicine on hand       REFERRAL TO PHARMACIST     Referral to the pharmacist: Not needed      [facility]     Shipping address confirmed in Epic.     Cost and Payment: Patient has a $0 copay, payment information is not required.    Delivery Scheduled: Yes, Expected medication delivery date: 06/29/23.  However, Rx request for refills was sent to the provider as there are none remaining.     Medication will be delivered via UPS to the prescription address in Epic WAM.    Emily Walter   [facility]  Specialty Technician

## 2023-07-02 DIAGNOSIS — D7581 Myelofibrosis: Principal | ICD-10-CM

## 2023-07-16 DIAGNOSIS — D7581 Myelofibrosis: Principal | ICD-10-CM

## 2023-07-17 NOTE — Unmapped (Signed)
 [facility] Refill Coordination Note    Specialty Medication(s) to be Shipped:   Hematology/Oncology: Ojjaara 100 mg    Other medication(s) to be shipped: No additional medications requested for fill at this time     Emily Walter, DOB: 14-Nov-1950  Phone: [phone] (home) [phone] (work)      All above HIPAA information was verified with patient.     Was a Nurse, learning disability used for this call? No    Completed refill call assessment today to schedule patient's medication shipment from the [facility]  [phone]).  All relevant notes have been reviewed.     Specialty medication(s) and dose(s) confirmed: Regimen is correct and unchanged.   Changes to medications: Shawntel reports no changes at this time.  Changes to insurance: No  New side effects reported not previously addressed with a pharmacist or physician: None reported  Questions for the pharmacist: No    Confirmed patient received a Conservation officer, historic buildings and a Surveyor, mining with first shipment. The patient will receive a drug information handout for each medication shipped and additional FDA Medication Guides as required.       DISEASE/MEDICATION-SPECIFIC INFORMATION        N/A    SPECIALTY MEDICATION ADHERENCE     Medication Adherence    Patient reported X missed doses in the last month: 0  Specialty Medication: OJJAARA 100 mg tablet (momelotinib)  Patient is on additional specialty medications: No  Patient is on more than two specialty medications: No  Any gaps in refill history greater than 2 weeks in the last 3 months: no  Demonstrates understanding of importance of adherence: yes              Were doses missed due to medication being on hold? No    OJJAARA 100   mg: 14 days of medicine on hand     REFERRAL TO PHARMACIST     Referral to the pharmacist: Not needed      [facility]     Shipping address confirmed in Epic.     Cost and Payment: Patient has a $0 copay, payment information is not required.    Delivery Scheduled: Yes, Expected medication delivery date: 07/24/23.     Medication will be delivered via UPS to the prescription address in Epic WAM.    Stephen Ehrlich   [facility]  Specialty Technician

## 2023-07-19 DIAGNOSIS — E7800 Pure hypercholesterolemia, unspecified: Principal | ICD-10-CM

## 2023-07-19 MED ORDER — LOVASTATIN 40 MG TABLET
ORAL_TABLET | Freq: Every day | ORAL | 1 refills | 90.00000 days | Status: CP
Start: 2023-07-19 — End: 2024-02-15

## 2023-07-23 MED FILL — OJJAARA 100 MG TABLET: ORAL | 30 days supply | Qty: 30 | Fill #1

## 2023-07-26 LAB — COMPREHENSIVE METABOLIC PANEL
ALBUMIN: 4.2 g/dL (ref 3.8–4.8)
ALKALINE PHOSPHATASE: 96 IU/L (ref 44–121)
ALT (SGPT): 9 IU/L (ref 0–32)
AST (SGOT): 20 IU/L (ref 0–40)
BILIRUBIN TOTAL (MG/DL) IN SER/PLAS: 0.9 mg/dL (ref 0.0–1.2)
BLOOD UREA NITROGEN: 16 mg/dL (ref 8–27)
BUN / CREAT RATIO: 12 (ref 12–28)
CALCIUM: 9.3 mg/dL (ref 8.7–10.3)
CHLORIDE: 101 mmol/L (ref 96–106)
CO2: 24 mmol/L (ref 20–29)
CREATININE: 1.31 mg/dL — ABNORMAL HIGH (ref 0.57–1.00)
EGFR: 42 mL/min/{1.73_m2} — ABNORMAL LOW
GLOBULIN, TOTAL: 2.7 g/dL (ref 1.5–4.5)
GLUCOSE: 107 mg/dL — ABNORMAL HIGH (ref 70–99)
POTASSIUM: 3.9 mmol/L (ref 3.5–5.2)
SODIUM: 143 mmol/L (ref 134–144)
TOTAL PROTEIN: 6.9 g/dL (ref 6.0–8.5)

## 2023-07-27 DIAGNOSIS — L739 Follicular disorder, unspecified: Principal | ICD-10-CM

## 2023-07-27 DIAGNOSIS — M25561 Pain in right knee: Principal | ICD-10-CM

## 2023-07-27 DIAGNOSIS — T148XXA Other injury of unspecified body region, initial encounter: Principal | ICD-10-CM

## 2023-07-27 LAB — CBC W/ DIFFERENTIAL
BASOPHILS ABSOLUTE COUNT: 2.8 10*3/uL — ABNORMAL HIGH (ref 0.0–0.2)
BASOPHILS RELATIVE PERCENT: 9 %
EOSINOPHILS ABSOLUTE COUNT: 3.5 10*3/uL — ABNORMAL HIGH (ref 0.0–0.4)
EOSINOPHILS RELATIVE PERCENT: 11 %
HEMATOCRIT: 37.7 % (ref 34.0–46.6)
HEMOGLOBIN: 11.2 g/dL (ref 11.1–15.9)
LYMPHOCYTES ABSOLUTE COUNT: 2.2 10*3/uL (ref 0.7–3.1)
LYMPHOCYTES RELATIVE PERCENT: 7 %
MEAN CORPUSCULAR HEMOGLOBIN CONC: 29.7 g/dL — ABNORMAL LOW (ref 31.5–35.7)
MEAN CORPUSCULAR HEMOGLOBIN: 27.1 pg (ref 26.6–33.0)
MEAN CORPUSCULAR VOLUME: 91 fL (ref 79–97)
MONOCYTES ABSOLUTE COUNT: 0.6 10*3/uL (ref 0.1–0.9)
MONOCYTES RELATIVE PERCENT: 2 %
NEUTROPHILS ABSOLUTE COUNT: 17.6 10*3/uL — ABNORMAL HIGH (ref 1.4–7.0)
NEUTROPHILS RELATIVE PERCENT: 55 %
NUCLEATED RED BLOOD CELLS: 7 % — ABNORMAL HIGH (ref 0–0)
PLATELET COUNT: 15 10*3/uL — ABNORMAL LOW (ref 150–450)
RED BLOOD CELL COUNT: 4.13 x10E6/uL (ref 3.77–5.28)
RED CELL DISTRIBUTION WIDTH: 19 % — ABNORMAL HIGH (ref 11.7–15.4)
WHITE BLOOD CELL COUNT: 31.5 10*3/uL (ref 3.4–10.8)

## 2023-07-27 LAB — IMMATURE CELLS
BANDS: 1 %
METAMYLOCYTES-LABCORP: 6 % — ABNORMAL HIGH (ref 0–0)
MYELOCYTES: 9 % — ABNORMAL HIGH (ref 0–0)

## 2023-07-27 NOTE — Unmapped (Signed)
 Internal Medicine Clinic Visit    Reason for visit: Bruising    A/P:      Emily Walter was seen today for follow-up.    Diagnoses and all orders for this visit:    Hematoma  Has some significant bruising on L upper/mid back for the last month, with some induration. Likely had some spontaneous bleeding there. Hemoglobin and platelets from two days ago are at her baseline. Sent message to hematologist during this visit, agreed that no acute intervention needed at this time and they will follow up with her at her upcoming appt in a few days.  - return precautions    Folliculitis  Has a small bump on vulva for past few days. Appears to be folliculitis.  - warm compresses    Posterior right knee pain  Present for a few days. This could likely be a small baker's cyst. No abnormalities felt on exam today.  - supportive care      Return for Next scheduled follow up.      __________________________________________________________    HPI:    Last visit 4/10 (phone). Saw heme/onc 5/12.     Bad bruising and knots on back for the last month - mostly on mid back L and upper side.  Very sore. Stings sometimes. Did have one on the R side of back, bu t it cleared up. Didn't feel any knots in that area at that time.   No recent trauma or falls. Didn't hit back on anything.  Takes percocet, helps    No new meds in the last month  Has been back on cancer drug for 4 months, ever since being on it the LUQ pain has resolved    Blister on vulva for the past two days     Pain back of R knee for a few days  __________________________________________________________    Problem List:  Problem List[1]    Medications:  Reviewed in EPIC  __________________________________________________________    Physical Exam:   Vital Signs:  Vitals:    07/27/23 0918   BP: 130/73   BP Position: Sitting   BP Cuff Size: Large   Pulse: 81   Temp: 36.3 ??C (97.3 ??F)   TempSrc: Temporal   SpO2: 94%   Weight: 98.9 kg (218 lb)   Height: 160 cm (5' 3)       Gen: Female sitting in chair in NAD.  HEENT: Bruin/AT. Sclera anicteric.   CV: RRR, no murmurs appreciated.  Pulm: Normal WOB. CTAB.  Skin: Bruising on L upper/mid back with induration noted on palpation.  Ext: No swelling noted on posterior R knee.      Medication adherence and barriers to the treatment plan have been addressed. Opportunities to optimize healthy behaviors have been discussed. Patient / caregiver voiced understanding.        Emily Bob, MD         [1]   Patient Active Problem List  Diagnosis    White coat syndrome with diagnosis of hypertension    Mixed urge and stress incontinence    Restless leg syndrome    Depression    Myelofibrosis       GERD (gastroesophageal reflux disease)    History of nephrolithiasis    Stage 3a chronic kidney disease (CMS-HCC)    Lumbar stenosis with neurogenic claudication    Osteoarthritis    Thrombocytopenia    Hyperlipidemia    Severe obstructive sleep apnea    Chronic heart failure with preserved ejection fraction  Prediabetes    Iron  deficiency anemia    Flank pain    Gallstones    Recurrent UTI    MDS/MPN (myelodysplastic/myeloproliferative neoplasms)

## 2023-07-27 NOTE — Unmapped (Addendum)
 Thank you for coming today!  Please follow up with your cancer doctor in a few days.  Please seek immediate medical attention if you notice significant bruising anywhere else and/or you develop lightheadedness or dizziness, or active bleeding from anywhere.    The bump in your groin is likely a hair follicle that has become inflamed. Use warm compresses for 5-10 minutes at a time, 3 times a day.    The pain on back of your knee is likely due to a Baker's cyst.  A Baker's cyst is a swelling behind the knee. It may cause pain or stiffness when you bend your knee or straighten it all the way. Baker's cysts are also called popliteal cysts.  If you have arthritis or another condition that is the cause of the Baker's cyst, your doctor may treat that condition. This may relieve the pain and swelling.  A Baker's cyst may go away on its own. If not, or if it is causing a lot of discomfort, your doctor may give you a shot of steroid medicine to reduce swelling. In some cases, fluid is drained with a needle or the Baker's cyst is removed in surgery.  There are things you can do at home to reduce the swelling and pain, such as staying off your leg.  How can you care for yourself at home?  Rest your knee as much as possible.  Ask your doctor if you can take an over-the-counter pain medicine, such as acetaminophen (Tylenol).  Use a cane, a crutch, a walker, or another device if you need help to get around. These can help rest your knees.  If you have an elastic bandage, make sure it is snug but not so tight that your leg is numb, tingles, or swells below the bandage. Loosen the bandage if it is too tight.

## 2023-07-29 DIAGNOSIS — D7581 Myelofibrosis: Principal | ICD-10-CM

## 2023-07-30 DIAGNOSIS — D7581 Myelofibrosis: Principal | ICD-10-CM

## 2023-07-31 ENCOUNTER — Ambulatory Visit
Admit: 2023-07-31 | Discharge: 2023-08-01 | Payer: Medicare (Managed Care) | Attending: Adult Health | Primary: Adult Health

## 2023-07-31 NOTE — Unmapped (Signed)
 Myeloproliferative Neoplasms Clinic Follow up Visit    Patient: Emily Walter  MRN: [id number]  DOB: [birth year]  Date of Visit: 07/31/2023      This patient visit was completed through the use of an audio/video or telephone encounter.    I spent 15 minutes on the phone with the patient. I spent an additional 20 minutes on pre- and post-visit activities.     The patient was physically located in [place]  or a state in which I am permitted to provide care. The patient understood that s/he may incur co-pays and cost sharing, and agreed to the telemedicine visit. The visit was completed via phone and/or video, which was appropriate and reasonable under the circumstances given the patient's presentation at the time.    The patient has been advised of the potential risks and limitations of this mode of treatment (including, but not limited to, the absence of in-person examination) and has agreed to be treated using telemedicine. The patient's/patient's family's questions regarding telemedicine have been answered.     If the phone/video visit was completed in an ambulatory setting, the patient has also been advised to contact their provider???s office for worsening conditions, and seek emergency medical treatment and/or call 911 if the patient deems either necessary.    Visit conducted by: Telephone  Person contacted: Cheryl VEAR Sprague  Contact phone number: [phone]  Is there someone else in the room? no       Reason for Visit   Emily Walter is a 73 y.o. with HFpEF, OSA, DM2, hypertension, CKD 3 (eGFR 56ml/min), staghorn calculi, chronic back pain, and triple negative primary myelofibrosis (higher risk) diagnosed 07/2016 who presents today in early follow up for progressive cytopenias prompting a bone marrow biopsy on 03/03/23.  Momelotinib 100mg  daily since 03/11/23.      Interval History     History of Present Illness  FAATIMAH Walter is a 73 year old female with myelofibrosis who presents for follow-up.    She developed a new, large bruise on her thoracic back area, which stings, is sore, itches at times, and has some knots. The bruise is slowly improving. She denies any trauma, falls, or accidents that could have caused it. Previously, she had a similar bruise on her right side that resolved without issues.    She is currently taking momelotinib 100 mg daily. She generally feels fine but experiences episodes of low energy, which are not daily occurrences. She avoids going out in the heat as it exacerbates her symptoms. Despite these episodes, she is able to perform daily activities such as cooking and washing clothes.    She mentioned a blood blister on the side of her tongue that appeared last week but resolved the next day. No other bleeding symptoms such as melena, gum bleeds, or epistaxis.    She reports soreness in one place under her breasts but no pain under her ribs where her spleen is located. Previously, she experienced pain on the left side due to an enlarging spleen, but this has resolved since starting her current medication.      Review of Systems   10-systems otherwise reviewed and negative except as per Interval History.    Assessment   #1 Primary myelofibrosis, intermediate-2 by DIPSS-Plus, High-risk by MIPSS70-Plus - with dysplastic progression 02/2023  #2 Thrombocytopenia - severe, plts 18 -  thought secondary to #1  #3 Anemia - Multifactorial, secondary to iron  deficiency, #1, CKD.  Improving 2/2 momelotinib 11.2 g/dL.  Primary myelofibrosis managed with momelotinib 100 mg daily. Recent large thoracic bruise, likely related to medication's bleeding risk. No recent falls or trauma. Bruise resolving slowly. Transient blood blister on tongue noted. Hemoglobin stable at 11.2. Increased white blood cell count, possibly due to inflammation. No splenic pain or enlargement. Monitoring for bleeding is crucial due to momelotinib's side effects.    #4 Splenomegaly - spleen 19.1cm on CT A/P from 01/14/23; was 15.6cm on 05/2022 and normal in 2020  Splenomegaly with associated flank pain. Spleen measured 21cm on CT at ER in May.  About 14cm below LCM on exam though body habitus makes it difficult to accurately assess.    #5 Chronic lumbar back pain - managed by PCP  #6 Lucency of right shoulder on right shoulder X-ray from 08/10/2021- SPEP/IFE obtained 03/12/23. Faint band suspicious for monoclonal component typed as IgM Lambda. Concentration of possible monoclonal protein cannot be accurately quantified due to the polyclonal background.   #7 HFpEF - no LE edema, stable symptoms - established with [facility]  #8 Staghorn renal calculi - follows with [facility]  #9 CKD3 - eGFR 61ml/min - follows with PCP  #10 Dyspnea and throat pain on exertion - pt had nuclear stress test 03/08/23 - results pending - followed by PCP  #11 Depression - on sertraline  25mg  po daily - managed by PCP  #12 Iron  deficiency- ferritin 140, iron  35, iron  sat 14% on 12/18/2022.  Received Infed  1g on 07/14/22, 03/30/23.  Iron  infusion improved hemoglobin levels. Continued monitoring of blood counts is necessary to assess treatment response.  - Monitor hemoglobin levels with monthly lab work.          Plan   Continue momelotinib 100mg  po daily  Dose reduced by 50% due to plts 18, baseline CKD (eGFR 65ml/min). Plan to increase if tolerates.   Indication = PMF with symptomatic splenomegaly + anemia  Monitoring = Local labs at [facility].  CBC-D, CMP - standing orders   Avoid aspirin/NSAIDs due to severe thrombocytopenia  Spleen ultrasound in 3 months in August as scheduled  Recheck labs in 3-4 weeks at [facility]  Follow up in person in August    Emily Adjutant, RN, MSN, AGPCNP-C  Nurse Practitioner  Hematologic Malignancies  [facility]  07/31/2023          History of Present Illness     Hematology/Oncology History   MDS/MPN (myelodysplastic/myeloproliferative neoplasms)      08/03/2016 Initial Diagnosis    PRIMARY MYELOFIBROSIS vs MDS/MPN with fibrosis    Prior labs show:  2014 at [facility]: normal CBC, WBC 6.5, Hgb 15.2, Plts 181.    CBC from 08/03/2016: WBC 10.3 x 10^9/L, Hgb 12.3 g/dL, MCV 85, plt 52 x 89^0/O.    Due to the cytopenias, Ms. Asche was sent to Dr. India Barge and underwent a bone marrow biopsy. I currently do not have bone marrow slides to review.  Outside [facility] report from 08/03/2016 states,  Hypercellular bone marrow (85%) with hyperplastic hematopoiesis.  Atypical megakaryocyte hyperplasia with focal clustering.  Abnormal localization of immature myeloid precursors.  No significant increase in blasts.  Mild to moderate reticulin fibrosis.    Driver mutation = SRSF2 (2018)  Karyotype = 46,XX,add(4)(q33),add(17)(q21)[13]     Higher-risk disease, with thrombocytopenia, SRSF2 mutation.      She was not an optimal alloHSCT candidate due to multiple co-morbidities.      She trialed ruxolitinib  but did not have a good response.     07/14/2022:  Received Infed  1g IV for iron  deficiency.     02/21/2023 Progression    MYELODYSPLASTIC PROGRESSION     She has been on observation alone for several years.  She now has progressive cytopenias, with platelets trending down to teens from 40-50's in April 2024 and hgb trending down from 11-12 g/dL in Feb/March 2024 to 9.9 g/dL now.  WBC has progressively increased, was 10 in 05/2022 and now consistently >15.  Myeloid mutation panel with persistent SRSF2 mutation and new ASXL1 mutation with VAF 5%. In this context, a bone marrow biopsy was performed at [facility] on 02/21/23.  This showed persistent myelofibrosis with dysplastic features, consistent with MDS/MPN or perhaps MDS with myelofibrosis.      Karyotype is pending, but prior karyotype 46,XX with add(4) and add(17).       The bone marrow now appears most consistent with MDS.    For prognostic calculations, I used labs from 02/21/23: ANC 15, hgb 9.9, plts 18, bm blasts 2%, age 42yo, intermediate karyotype, SRSF2, ASXL1.      IPSS:  Int-1 (1 pt)  IPSS-R: Intermediate. (4pt) 34yr median overall survival.  25% AML transformation 3.2 yrs.   IPSS-M: Moderate-High. Leukemia-free survival 2.3 yrs. Overall survival 2.8 yrs.      03/11/2023 -  Chemotherapy    MOMELOTINIB    Indication = primary myelofibrosis with anemia   Starting dose = 100mg  po daily (reduced for platelet count 18)    03/12/23: WBC 24.7, Hgb 10, Plts 18. sCr 1.34.   04/09/23: WBC 26.6, Hgb 10.6, Plts 17.  sCr 1.34. 4% myelocytes, 4% metamyelocytes.   05/10/23: WBC 21.8, hgb 11.6, plt 18  06/11/23: Patient stopped momelotinib without team's knowledge due to what she considered new side effects  06/18/23: WBC 24.4, hgb 10.4, plt 13, sCr 1.46             Medications     Current Outpatient Medications   Medication Sig Dispense Refill    albuterol  HFA 90 mcg/actuation inhaler Inhale 2 puffs every six (6) hours as needed for wheezing or shortness of breath. 8 g 3    cholecalciferol , vitamin D3-50 mcg, 2,000 unit,, 50 mcg (2,000 unit) tablet Take 1 tablet (50 mcg total) by mouth in the morning.      conjugated estrogens  (PREMARIN ) 0.625 mg/gram vaginal cream Insert 0.5 g into the vagina Two (2) times a week. 30 g 1    diclofenac  sodium (VOLTAREN ) 1 % gel Apply 4 g topically 4 (four) times a day as needed for arthritis or pain (Knee pain). 100 g 2    fluticasone propionate (FLONASE) 50 mcg/actuation nasal spray 2 sprays into each nostril daily as needed for allergies.      furosemide  (LASIX ) 40 MG tablet Take 1 tablet (40 mg total) by mouth daily. 90 tablet 3    levocetirizine (XYZAL) 5 MG tablet Take 1 tablet (5 mg total) by mouth every evening. 90 tablet 3    lidocaine (LIDODERM) 5 % patch Place 1 patch on the skin daily. Apply to affected area for 12 hours only each day (then remove patch)      lovastatin  (MEVACOR ) 40 MG tablet Take 1 tablet (40 mg total) by mouth daily. 90 tablet 1    miscellaneous medical supply Misc Rollator for daily use with ambulation dx: M48.061      momelotinib (OJJAARA ) 100 mg tablet Take 1 tablet (100 mg total) by mouth daily. Swallow tablets whole; do not cut, crush, or chew. 30 tablet  2    NARCAN 4 mg/actuation nasal spray 1 spray into alternating nostrils once as needed.  0    omeprazole  (PRILOSEC) 40 MG capsule Take 1 capsule (40 mg total) by mouth daily. 90 capsule 3    ondansetron  (ZOFRAN -ODT) 4 MG disintegrating tablet Dissolve 1 tablet (4 mg total) in the mouth every eight (8) hours as needed for nausea. 15 tablet 3    oxyCODONE -acetaminophen (PERCOCET) 10-325 mg per tablet Take 1 tablet by mouth every six (6) hours as needed for pain.      polyethylene glycol (GOLYTELY ) 236-22.74-6.74 gram solution Take by mouth as directed per [facility] GI prep instructions, for split bowel prep. (Patient not taking: Reported on 07/27/2023) 4000 mL 0    sertraline  (ZOLOFT ) 25 MG tablet Take 1 tablet (25 mg total) by mouth daily. 90 tablet 3    spironolactone  (ALDACTONE ) 25 MG tablet Take 1 tablet (25 mg total) by mouth daily. 90 tablet 2    ULTRA-LIGHT ROLLATOR Misc Use as directed.  0     No current facility-administered medications for this visit.       Allergies     Allergies   Allergen Reactions    Fentanyl Anaphylaxis    Codeine Other (See Comments)     Headache, nausea    Corticosteroids (Glucocorticoids) Headache     **ALL STEROIDS**    Gabapentin Other (See Comments)     oversedation    Prednisone  Headache    Pregabalin Rash     Swelling and general fatigue       Past Medical and Surgical History     Past Medical History:   Diagnosis Date    Anxiety     Arthritis     Asthma (HHS-HCC)     Cancer        Cataract     Chronic kidney disease     Depression     Difficult intravenous access     GERD (gastroesophageal reflux disease)     Heart failure        HLD (hyperlipidemia)     Hypertension     Myelofibrosis        Nephrolithiasis     Sleep apnea     Tinnitus      Past Surgical History:   Procedure Laterality Date    HYSTERECTOMY      PR CYSTO/URETERO W/LITHOTRIPSY &INDWELL STENT INSRT Left 08/21/2018 Procedure: priority CYSTOURETHROSCOPY, WITH URETEROSCOPY AND/OR PYELOSCOPY; WITH LITHOTRIPSY INCLUDING INSERTION OF INDWELLING URETERAL STENT;  Surgeon: Nicholaus Mt Viprakasit, MD;  Location: CYSTO PROCEDURE SUITES [facility];  Service: Urology    PR CYSTO/URETERO/PYELOSCOPY, CALCULUS TX Left 09/04/2018    Procedure: CYSTOURETHROSCOPY, W/URETEROSCOPY &/OR PYELOSCOPY; W/REMOVAL/MANIPULATION CALCULUS(URETERAL CATH INCLUDED);  Surgeon: Nicholaus Mt Viprakasit, MD;  Location: CYSTO PROCEDURE SUITES [facility];  Service: Urology    PR CYSTOSCOPY,INSERT URETERAL STENT Left 05/04/2022    Procedure: CYSTOURETHROSCOPY,  WITH INSERTION OF INDWELLING URETERAL STENT (EG, GIBBONS OR DOUBLE-J TYPE);  Surgeon: Drena Darryle Gull, MD;  Location: MAIN OR [facility];  Service: Urology    PR CYSTOSCOPY,REMV CALCULUS,SIMPLE Left 09/04/2018    Procedure: CYSTOURETHROSCOPY, WITH REMOVAL OF FOREIGN BODY, CALCULUS OR URETERAL STENT FROM URETHRA OR BLADDER; SIMPLE;  Surgeon: Nicholaus Mt Viprakasit, MD;  Location: CYSTO PROCEDURE SUITES [facility];  Service: Urology    PR CYSTOURETHROSCOPY,URETER CATHETER Left 08/21/2018    Procedure: CYSTOURETHROSCOPY, W/URETERAL CATHETERIZATION, W/WO IRRIG, INSTILL, OR URETEROPYELOG, EXCLUS OF RADIOLG SVC;  Surgeon: Nicholaus Mt Viprakasit, MD;  Location: CYSTO PROCEDURE SUITES [facility]  Service: Urology  PR CYSTOURETHROSCOPY,URETER CATHETER Left 09/04/2018    Procedure: CYSTOURETHROSCOPY, W/URETERAL CATHETERIZATION, W/WO IRRIG, INSTILL, OR URETEROPYELOG, EXCLUS OF RADIOLG SVC;  Surgeon: Nicholaus Mt Viprakasit, MD;  Location: CYSTO PROCEDURE SUITES [facility];  Service: Urology    PR PERQ DILATION XST TRC NEW ACCESS RENAL COLTJ SYS Left 04/24/2022    Procedure: DILATION OF EXISTING TRACT, PERC, FOR ENDOUROLOGIC PROC INCL IMAGING GUIDANCE & RADIOLOGIC SUPERVISION & INTERPRET, W POSTPROC TUBE PLACEMENT, WHEN PERF; INCL NEW ACCESS INTO RENAL COLLECTING SYSTEM;  Surgeon: Mitch Alm Pickles, MD;  Location: [facility] OR [facility];  Service: Urology    PR PERQ NL/PL LITHOTRP COMPLEX >2 CM MLT LOCATIONS Left 04/24/2022    Procedure: standard PCNL prone vs. standard; need fortec;  Surgeon: Mitch Alm Pickles, MD;  Location: [facility] OR [facility];  Service: Urology    PR REMOVE BLADDER STONE,<2.5 CM N/A 08/21/2018    Procedure: LITHOLAPAXY: CRUSH/FRAGMENT CALCULUS, BY ANY MEANS IN BLADDER & REMOVAL OF FRAGMENTS; SIMPLE/SMALL <2.5 CM;  Surgeon: Nicholaus Mt Viprakasit, MD;  Location: CYSTO PROCEDURE SUITES [facility];  Service: Urology    SALPINGOOPHORECTOMY Right     12/07/2010    VAGINAL HYSTERECTOMY      12/07/2010       Social History     Social History     Socioeconomic History    Marital status: Married   Tobacco Use    Smoking status: Never    Smokeless tobacco: Never   Vaping Use    Vaping status: Never Used   Substance and Sexual Activity    Alcohol use: No    Drug use: No    Sexual activity: Not Currently     Social Drivers of Health     Financial Resource Strain: Low Risk  (04/25/2022)    Overall Financial Resource Strain (CARDIA)     Difficulty of Paying Living Expenses: Not hard at all   Food Insecurity: No Food Insecurity (05/17/2023)    Hunger Vital Sign     Worried About Running Out of Food in the Last Year: Never true     Ran Out of Food in the Last Year: Never true   Transportation Needs: No Transportation Needs (05/17/2023)    PRAPARE - Therapist, art (Medical): No     Lack of Transportation (Non-Medical): No   Housing: Low Risk  (05/17/2023)    Housing     Within the past 12 months, have you ever stayed: outside, in a car, in a tent, in an overnight shelter, or temporarily in someone else's home (i.e. 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  Exposed to 2nd hand smoke; she is a lifelong non-smoker.  Went on disability in 2004/08/28 d/t back and has worsened this year.      Enjoys traveling.  Married - husband is widowed.      Family History Two sons - the youngest died in 29-Aug-2011 at the age of 46 from bladder cancer.     Physical Examination     There were no vitals filed for this visit.        GENERAL: Appears tired but in no acute distress.  Accompanied by her husband.  HEENT: No scleral icterus.  No periorbital purpura.  RESP: CTAB.    CV: RRR.  No murmurs rubs or gallops appreciated.  ABD: BS present, splenomegaly to about 14cm, limited by body habitus.  NEURO: A&Ox4, CN observed are WNL  PSYCH: Normal affect, affect is full and congruent.  No psychomotor agitation nor retardation.  Thought process is goal-directed and linear.      Laboratory Testing and Imaging     Results for orders placed or performed in visit on 07/16/23   CBC w/ Differential   Result Value Ref Range    WBC 31.5 (HH) 3.4 - 10.8 x10E3/uL    RBC 4.13 3.77 - 5.28 x10E6/uL    HGB 11.2 11.1 - 15.9 g/dL    HCT 62.2 65.9 - 53.3 %    MCV 91 79 - 97 fL    MCH 27.1 26.6 - 33.0 pg    MCHC 29.7 (L) 31.5 - 35.7 g/dL    RDW 80.9 (H) 88.2 - 15.4 %    Platelet 15 (L) 150 - 450 x10E3/uL    Neutrophils % 55 Not Estab. %    Lymphocytes % 7 Not Estab. %    Monocytes % 2 Not Estab. %    Eosinophils % 11 Not Estab. %    Basophils % 9 Not Estab. %    Immature Cells Note     Absolute Neutrophils 17.6 (H) 1.4 - 7.0 x10E3/uL    Absolute Lymphocytes 2.2 0.7 - 3.1 x10E3/uL    Absolute Monocytes  0.6 0.1 - 0.9 x10E3/uL    Absolute Eosinophils 3.5 (H) 0.0 - 0.4 x10E3/uL    Absolute Basophils  2.8 (H) 0.0 - 0.2 x10E3/uL    Nucleated RBC 7 (H) 0 - 0 %    Hematology Comments: Note:    Comprehensive Metabolic Panel   Result Value Ref Range    Glucose 107 (H) 70 - 99 mg/dL    BUN 16 8 - 27 mg/dL    Creatinine 8.68 (H) 0.57 - 1.00 mg/dL    eGFR 42 (L) >40 fO/fpw/8.26    BUN/Creatinine Ratio 12 12 - 28    Sodium 143 134 - 144 mmol/L    Potassium 3.9 3.5 - 5.2 mmol/L    Chloride 101 96 - 106 mmol/L    CO2 24 20 - 29 mmol/L    Calcium 9.3 8.7 - 10.3 mg/dL    Total Protein 6.9 6.0 - 8.5 g/dL    Albumin 4.2 3.8 - 4.8 g/dL    Globulin, Total 2.7 1.5 - 4.5 g/dL    Total Bilirubin 0.9 0.0 - 1.2 mg/dL    Alkaline Phosphatase 96 44 - 121 IU/L    AST 20 0 - 40 IU/L    ALT 9 0 - 32 IU/L   Immature Cells   Result Value Ref Range    Bands 1 Not Estab. %    Metamylocytes 6 (H) 0 - 0 %    Myelocytes 9 (H) 0 - 0 %     *Note: Due to a large number of results and/or encounters for the requested time period, some results have not been displayed. A complete set of results can be found in Results Review.

## 2023-08-02 DIAGNOSIS — D7581 Myelofibrosis: Principal | ICD-10-CM

## 2023-08-07 NOTE — Unmapped (Signed)
 Can you please let her know that she should not need a referral, and I would recommend finding a local podiatrist as the wait for [facility] podiatry is generally longer

## 2023-08-07 NOTE — Unmapped (Signed)
 Copied from CRM #[id number]. Topic: Referral - New Referral Request  >> Aug 07, 2023 10:31 AM Leim T wrote:  The patient  called requesting a referral to Podiatry     Reason for Referral: one bad nail on big toe    Referral(s):  Facility Name It doesn't matter     Coverage: yes, coverage is accurate on file.    Preferred Contact: {Cell Phone    Routine callback turnaround time: 24-48 business hours. Programmer, systems Notified)

## 2023-08-07 NOTE — Unmapped (Signed)
 LM on patient VM requesting additional details on the bad nail.   Patient was advised to return a call to the clinic or to schedule a virtual visit to discuss symptoms and determine next steps.

## 2023-08-08 NOTE — Unmapped (Signed)
 Patient had already scheduled an [facility] evaluation for 08/09/23.  Notified patient that they could contact local podiatrist, however patient requested to keep appointment for next steps.

## 2023-08-09 ENCOUNTER — Inpatient Hospital Stay: Admit: 2023-08-09 | Discharge: 2023-08-09 | Payer: Medicare (Managed Care)

## 2023-08-09 DIAGNOSIS — B351 Tinea unguium: Principal | ICD-10-CM

## 2023-08-09 DIAGNOSIS — M25561 Pain in right knee: Principal | ICD-10-CM

## 2023-08-09 NOTE — Unmapped (Unsigned)
 Internal Medicine Initial Visit        Assessment/Plan:     Emily Walter presents today to establish care.    {TIP - [facility]- RAFF Pilot- Clinical Documentation Specialist Recommendations-  No specialty comments available.   This text will self delete upon signing note:[id number]}       Assessment & Plan  Acute pain of right knee  Plan  Orders:    XR Knee 1 or 2 Views Right; Future    US  Knee Right; Future    Onychomycosis    Orders:    Ambulatory referral to Podiatry; Future         Staffed with Dr. Ed, seen and discussed    Return in about 3 months (around 11/09/2023).        Chief Complaint:      Emily Walter is a 73 y.o. female who presents to Establish Care for ***      Subjective:     HPI  ***      The past medical history, surgical history, family history, social history, medications and allergies were reviewed in Epic.     Review of Systems    The balance of 10 systems was reviewed and unremarkable except as stated above.        Objective:     BP 113/63 (BP Site: L Arm, BP Position: Sitting, BP Cuff Size: Small)  - Pulse 81  - Temp 35.9 ??C (96.7 ??F) (Temporal)  - Resp 18  - Ht 160 cm (5' 3)  - Wt 98.8 kg (217 lb 12.8 oz)  - SpO2 97%  - BMI 38.58 kg/m??      General: ***  HEENT: ***  Eyes: ***  Cardiovascular: ***  Pulmonary: ***  Gastrointestinal: ***  Musculoskeletal: ***  Skin: ***  Back: ***  ***      Records Review ***    PHQ-9 Score:     GAD-7 Score:         Medication adherence and barriers to the treatment plan have been addressed. Opportunities to optimize healthy behaviors have been discussed. Patient / caregiver voiced understanding.

## 2023-08-09 NOTE — Unmapped (Signed)
 [facility] at [facility]     Reason for visit: Follow up    Questions / Concerns that need to be addressed:     Screening BP 113/63 P 81    Omron BPs (complete if screening BP has a systolic  > 130 or diastolic > 80)  BP#1    BP#2   BP#3     Average BP   (please note this as a comment in vitals)     PTHomeBP           Dexcom or Libre CGM in use? If so, pull appropriate reporting through portal (Dexcom) or EPIC order [name]).    HCDM reviewed and updated in Epic:    We are working to make sure all of our patients??? wishes are updated in Epic and part of that is documenting a Environmental health practitioner for each patient  A Health Care Decision Maker is someone you choose who can make health care decisions for you if you are not able - who would you most want to do this for you????  was updated.    HCDM (patient stated preference): Emily, Walter - Spouse - [phone]    HCDM, First AlternateBETHA Zachary Jodie Walter Son [phone]    BPAs completed:      Annual Screenings:   Tobacco, Audit/Alcohol , Substance Use, Domestic Abuse, Health Literacy, and Falls - 65+  __________________________________________________________________________________________    SCREENINGS COMPLETED IN FLOWSHEETS      AUDIT       PHQ2       PHQ9          GAD7       COPD Assessment       Falls Risk

## 2023-08-09 NOTE — Unmapped (Signed)
 It was nice to see you in clinic today! Below is a summary of what we discussed:     1) Xray of R knee today  2) Ultrasound of R knee pending at [facility], referral placed  3) See podiatry for your nail. Referral given.      General Clinic Information:  - Our clinic is open Monday through Friday 8AM-5PM. Please call [phone] (or toll free [phone]) or send a MyChart message to request an appointment. Our fax number is [phone].  - Our Same Day Clinic functions as an urgent care that can see you for same day appointments if you primary provider is not available. Please call [phone] (or toll free [phone]) and ask for an appointment in the Same Day Clinic  - We have a full service lab, imaging services, and outpatient pharmacy on the first floor of [place].   - If you need medical attention after 5PM during the week or it's the weekend, call the [facility] 24/7 Nursing Line [phone] to get nurse advice.

## 2023-08-10 DIAGNOSIS — D7581 Myelofibrosis: Principal | ICD-10-CM

## 2023-08-10 NOTE — Unmapped (Signed)
 Internal Medicine Clinic Visit    Reason for visit: onychomycosis R toe nail    A/P:       Assessment & Plan  Acute pain of right knee  Given new worsening pain of the right lower extremity ordered x-ray to rule out any fracture. This could be worsening of her osteoarthritis. Ultrasound to rule out any fluid buildup or clotting in the area.  Discussed conservative measures including arthritis cream, heat, ice, and elevation.  Orders:    XR Knee 1 or 2 Views Right; Future    US  Knee Right; Future    Onychomycosis  Advised the patient to go to a podiatrist for her nail.  Patient plans to go to a local podiatrist in [place].  Orders:    Ambulatory referral to Podiatry; Future      Return in about 3 months (around 11/09/2023).    Staffed with Dr. Ed, seen and discussed    __________________________________________________________    HPI:    Emily Walter is a 73 year old female with a history of myelofibrosis, depression, restless leg syndrome, severe obstructive sleep apnea, and lumbar stenosis with neurogenic claudication who presented today for what she thought was a visit to cut her toenail. Does not recall previous conversations recommending her to go to a local podiatrist for her thick toe nails. She also endorses some leg pain in her right lower extremity. She says this is some chronic pain however has worsened recently. She reports some pain around her right hip which radiates down her leg.  Denies any tingling, numbness, and burning.  Says that most of the pain is localized behind her right knee.  Denies any recent falls or trauma to the area.  __________________________________________________________      Medications:  Reviewed in EPIC  __________________________________________________________    Physical Exam:   Vital Signs:  Vitals:    08/09/23 1355   BP: 113/63   BP Site: L Arm   BP Position: Sitting   BP Cuff Size: Small   Pulse: 81   Resp: 18   Temp: 35.9 ??C (96.7 ??F)   TempSrc: Temporal   SpO2: 97%   Weight: 98.8 kg (217 lb 12.8 oz)   Height: 160 cm (5' 3)     Gen: NAD  CV: RRR  Pulm: CTA bilaterally, no crackles or wheezes  Abd: normal BS.   Ext: nonpitting edema to the bilateral lower extremities, no pain to palpation  Skin: thick main toe nail bilaterally    Medication adherence and barriers to the treatment plan have been addressed. Opportunities to optimize healthy behaviors have been discussed. Patient / caregiver voiced understanding.    Eber Dare, PGY1

## 2023-08-12 NOTE — Unmapped (Signed)
 Contacted patient to deliver results of right knee x-ray.  Discussed findings that are consistent with osteoarthritis.  No fractures. Advised the patient to complete her right lower extremity ultrasound at her convenience to see if there is anything else contributing to her right lower extremity pain.     Eber Dare, PGY1  August 12, 2023 3:38 PM

## 2023-08-12 NOTE — Unmapped (Signed)
 Addended by: ED SEAL on: 08/12/2023 11:15 AM     Modules accepted: Level of Service

## 2023-08-13 DIAGNOSIS — D7581 Myelofibrosis: Principal | ICD-10-CM

## 2023-08-14 NOTE — Unmapped (Signed)
 Copied from CRM #[id number]. Topic: Access To Clinicians - Req Clinic Call Back  >> Aug 14, 2023  9:23 AM Leim T wrote:  Patient is calling and wants a nurse to call her in regards to her ultrasound.  Patient doesn't have a mychart.  The best phone number to call patient is [phone]

## 2023-08-16 NOTE — Unmapped (Signed)
 [facility] Refill Coordination Note    Specialty Medication(s) to be Shipped:   Hematology/Oncology: OJJAARA  100 mg tablet (momelotinib)    Other medication(s) to be shipped: No additional medications requested for fill at this time     Emily Walter, DOB: Jan 04, 1951  Phone: [phone] (home) [phone] (work)      All above HIPAA information was verified with patient.     Was a Nurse, learning disability used for this call? No    Completed refill call assessment today to schedule patient's medication shipment from the [facility]  [phone]).  All relevant notes have been reviewed.     Specialty medication(s) and dose(s) confirmed: Regimen is correct and unchanged.   Changes to medications: Emily Walter reports no changes at this time.  Changes to insurance: No  New side effects reported not previously addressed with a pharmacist or physician: None reported  Questions for the pharmacist: No    Confirmed patient received a Conservation officer, historic buildings and a Surveyor, mining with first shipment. The patient will receive a drug information handout for each medication shipped and additional FDA Medication Guides as required.       DISEASE/MEDICATION-SPECIFIC INFORMATION        N/A    SPECIALTY MEDICATION ADHERENCE     Medication Adherence    Patient reported X missed doses in the last month: 0  Specialty Medication: OJJAARA  100 mg tablet (momelotinib)  Patient is on additional specialty medications: No              Were doses missed due to medication being on hold? No    OJJAARA  100 mg tablet (momelotinib): 14 days of medicine on hand       REFERRAL TO PHARMACIST     Referral to the pharmacist: Not needed      [facility]     Shipping address confirmed in Epic.     Cost and Payment: Patient has a $0 copay, payment information is not required.    Delivery Scheduled: Yes, Expected medication delivery date: 08/23/23.     Medication will be delivered via UPS to the prescription address in Epic WAM.    Emily Walter   [facility]  Specialty Technician

## 2023-08-21 ENCOUNTER — Emergency Department
Admit: 2023-08-21 | Discharge: 2023-08-21 | Disposition: A | Discharge status: Home / Self Care | Payer: Medicare (Managed Care)

## 2023-08-21 ENCOUNTER — Ambulatory Visit: Admit: 2023-08-21 | Payer: Medicare (Managed Care)

## 2023-08-21 DIAGNOSIS — W19XXXA Unspecified fall, initial encounter: Principal | ICD-10-CM

## 2023-08-21 LAB — CBC W/ AUTO DIFF
HEMATOCRIT: 37.5 % (ref 34.0–44.0)
HEMOGLOBIN: 12.3 g/dL (ref 11.3–14.9)
MEAN CORPUSCULAR HEMOGLOBIN CONC: 32.8 g/dL (ref 32.0–36.0)
MEAN CORPUSCULAR HEMOGLOBIN: 27.5 pg (ref 25.9–32.4)
MEAN CORPUSCULAR VOLUME: 83.8 fL (ref 77.6–95.7)
MEAN PLATELET VOLUME: 9.8 fL (ref 6.8–10.7)
NUCLEATED RED BLOOD CELLS: 4 /100{WBCs}
PLATELET COUNT: 18 10*9/L — ABNORMAL LOW (ref 150–450)
RED BLOOD CELL COUNT: 4.48 10*12/L (ref 3.95–5.13)
RED CELL DISTRIBUTION WIDTH: 20.9 % — ABNORMAL HIGH (ref 12.2–15.2)
WBC ADJUSTED: 30.8 10*9/L — ABNORMAL HIGH (ref 3.6–11.2)

## 2023-08-21 LAB — MANUAL DIFFERENTIAL
BASOPHILS - ABS (DIFF): 1.2 10*9/L — ABNORMAL HIGH (ref 0.0–0.1)
BASOPHILS - REL (DIFF): 4 %
EOSINOPHILS - ABS (DIFF): 2.5 10*9/L — ABNORMAL HIGH (ref 0.0–0.5)
EOSINOPHILS - REL (DIFF): 8 %
LYMPHOCYTES - ABS (DIFF): 2.8 10*9/L (ref 1.1–3.6)
LYMPHOCYTES - REL (DIFF): 9 %
MONOCYTES - ABS (DIFF): 0.3 10*9/L (ref 0.3–0.8)
MONOCYTES - REL (DIFF): 1 %
NEUTROPHILS - ABS (DIFF): 24 10*9/L — ABNORMAL HIGH (ref 1.8–7.8)
NEUTROPHILS - REL (DIFF): 78 %

## 2023-08-21 LAB — COMPREHENSIVE METABOLIC PANEL
ALBUMIN: 4 g/dL (ref 3.4–5.0)
ALKALINE PHOSPHATASE: 93 U/L (ref 46–116)
ALT (SGPT): 8 U/L — ABNORMAL LOW (ref 10–49)
ANION GAP: 11 mmol/L (ref 5–14)
AST (SGOT): 24 U/L (ref <=34)
BILIRUBIN TOTAL: 1.1 mg/dL (ref 0.3–1.2)
BLOOD UREA NITROGEN: 29 mg/dL — ABNORMAL HIGH (ref 9–23)
BUN / CREAT RATIO: 18
CALCIUM: 9.4 mg/dL (ref 8.7–10.4)
CHLORIDE: 103 mmol/L (ref 98–107)
CO2: 29 mmol/L (ref 20.0–31.0)
CREATININE: 1.58 mg/dL — ABNORMAL HIGH (ref 0.55–1.02)
EGFR CKD-EPI (2021) FEMALE: 34 mL/min/1.73m2 — ABNORMAL LOW (ref >=60)
GLUCOSE RANDOM: 106 mg/dL (ref 70–179)
POTASSIUM: 4.7 mmol/L (ref 3.4–4.8)
PROTEIN TOTAL: 8.3 g/dL — ABNORMAL HIGH (ref 5.7–8.2)
SODIUM: 143 mmol/L (ref 135–145)

## 2023-08-21 MED ADMIN — diph,pertuss(acel),tetanus vaccine-Tdap (BOOSTRIX) injection 0.5 mL: .5 mL | INTRAMUSCULAR | @ 20:00:00 | Stop: 2023-08-21

## 2023-08-21 NOTE — Unmapped (Signed)
 Pt here after a mechanical fall. Pt hit R eye. -thinners, - LOC

## 2023-08-21 NOTE — Unmapped (Signed)
 [facility]  Emergency Department Provider Note     ED Clinical Impression     Final diagnoses:   Fall, initial encounter (Primary)      Impression, Medical Decision Making, ED Course     DDX: The differential for this complaint includes but is not limited to trauma secondary to mechanical fall with unknown coagulable status..     MDM: 73 y.o. female with PMH most significant for myelofibrosis and multijoint arthritis who presents with acute facial injury secondary to fall As described below.  History and presentation largely concerned with patient's increased risk of bleeding due to low platelets.  In addition has had significant trauma to the face.  Will proceed with CT imaging of the head face and cervical spine.  Will assess CBC, CMP.  In case need to replete or provide blood products.    Initial vitals reviewed as within normal limits and nonactionable at this time.    Initial workup to include CT head, face, cervical spine.  Given myelofibrosis will assess labs for any coagulopathy given history of platelets in the low 10K's.    Labs largely consistent with baseline, creatinine mildly elevated however low suspicion for rhabdo as patient was not down on the ground for extended period of time.  Please encourage oral intake of fluids to recover back to baseline kidney function.       Disposition: Home with follow-up for PCP    MDM Elements                ____________________________________________    The case was discussed with the attending physician, who is in agreement with the above assessment and plan.      History     Chief Complaint  Chief Complaint   Patient presents with    Fall       HPI   Emily Walter is a 73 y.o. female with past medical history of low fibrosis, pain management for lower back and knee pain, hypertension and COPD who presents with acute facial trauma after a fall.  Patient endorses getting out of a truck to go to a store when she missed the step up on the curb and fell.  Husband witnessed the fall.  No loss consciousness, nausea or vomiting, no midline spinal tenderness.  Does endorse right shoulder tenderness predominantly on the trap.  Has no changes in vision, no pain with ocular movements, no changes in mentation.  Does not take blood thinners.  Does have history of myelofibrosis with unknown coagulation status.  No history of heart attack or strokes, no history of seizures, took a home dose of oxycodone  given with pain management.  Improved pain tolerance.  Denies abdominal pain, chest pain, shortness of breath.  Did not report trauma to the abdomen    Outside Historian(s): I have obtained additional history/collateral from husband at bedside.    Past Medical History[1]    Past Surgical History[2]    No current facility-administered medications for this encounter.    Current Outpatient Medications:     albuterol  HFA 90 mcg/actuation inhaler, Inhale 2 puffs every six (6) hours as needed for wheezing or shortness of breath., Disp: 8 g, Rfl: 3    cholecalciferol , vitamin D3-50 mcg, 2,000 unit,, 50 mcg (2,000 unit) tablet, Take 1 tablet (50 mcg total) by mouth in the morning., Disp: , Rfl:     conjugated estrogens  (PREMARIN ) 0.625 mg/gram vaginal cream, Insert 0.5 g into the vagina Two (2) times a week., Disp:  30 g, Rfl: 1    diclofenac  sodium (VOLTAREN ) 1 % gel, Apply 4 g topically 4 (four) times a day as needed for arthritis or pain (Knee pain)., Disp: 100 g, Rfl: 2    fluticasone propionate (FLONASE) 50 mcg/actuation nasal spray, 2 sprays into each nostril daily as needed for allergies., Disp: , Rfl:     furosemide  (LASIX ) 40 MG tablet, Take 1 tablet (40 mg total) by mouth daily., Disp: 90 tablet, Rfl: 3    levocetirizine (XYZAL) 5 MG tablet, Take 1 tablet (5 mg total) by mouth every evening., Disp: 90 tablet, Rfl: 3    lidocaine (LIDODERM) 5 % patch, Place 1 patch on the skin daily. Apply to affected area for 12 hours only each day (then remove patch), Disp: , Rfl:     lovastatin  (MEVACOR ) 40 MG tablet, Take 1 tablet (40 mg total) by mouth daily., Disp: 90 tablet, Rfl: 1    miscellaneous medical supply Misc, Rollator for daily use with ambulation dx: M48.061, Disp: , Rfl:     momelotinib (OJJAARA ) 100 mg tablet, Take 1 tablet (100 mg total) by mouth daily. Swallow tablets whole; do not cut, crush, or chew., Disp: 30 tablet, Rfl: 2    NARCAN 4 mg/actuation nasal spray, 1 spray into alternating nostrils once as needed., Disp: , Rfl: 0    omeprazole  (PRILOSEC) 40 MG capsule, Take 1 capsule (40 mg total) by mouth daily., Disp: 90 capsule, Rfl: 3    ondansetron  (ZOFRAN -ODT) 4 MG disintegrating tablet, Dissolve 1 tablet (4 mg total) in the mouth every eight (8) hours as needed for nausea., Disp: 15 tablet, Rfl: 3    oxyCODONE -acetaminophen (PERCOCET) 10-325 mg per tablet, Take 1 tablet by mouth every six (6) hours as needed for pain., Disp: , Rfl:     polyethylene glycol (GOLYTELY ) 236-22.74-6.74 gram solution, Take by mouth as directed per [facility] GI prep instructions, for split bowel prep. (Patient not taking: Reported on 08/09/2023), Disp: 4000 mL, Rfl: 0    sertraline  (ZOLOFT ) 25 MG tablet, Take 1 tablet (25 mg total) by mouth daily., Disp: 90 tablet, Rfl: 3    spironolactone  (ALDACTONE ) 25 MG tablet, Take 1 tablet (25 mg total) by mouth daily., Disp: 90 tablet, Rfl: 2    ULTRA-LIGHT ROLLATOR Misc, Use as directed., Disp: , Rfl: 0    Allergies  Fentanyl, Codeine, Corticosteroids (glucocorticoids), Gabapentin, Prednisone , and Pregabalin    Family History  Family History[3]    Social History  Short Social History[4]     Physical Exam     VITAL SIGNS:      Vitals:    08/21/23 1141 08/21/23 1146 08/21/23 1149   BP:   135/78   Pulse: 94  91   Resp:   18   Temp:  36.4 ??C (97.5 ??F)    TempSrc:  Oral    SpO2: 96%     Weight:  99.1 kg (218 lb 7.6 oz)        Constitutional: Alert and oriented. No acute distress.  Eyes: Right conjunctive a with blood, left unremarkable.  Normal ocular motor movements.  No pain with movements.  Pupils equal and reactive to light.  HEENT: Normocephalic and atraumatic. Conjunctivae clear. No congestion. Moist mucous membranes.  No point tenderness to cervical spine.  There is tenderness to the right trap however.  Obvious trauma to face including the right eyebrow, right infraorbital space, nose.  No loose or freely moving bones palpated on exam.  Cardiovascular: Rate as above,  regular rhythm. Normal and symmetric distal pulses. Brisk capillary refill. Normal skin turgor.  Respiratory: Normal respiratory effort. Breath sounds are normal. There are no wheezing or crackles heard.  Gastrointestinal: Tenderness to the epigastric and left upper quadrant area.  Spleen enlarged and palpable proximately 3 cm below the left costal margin.  No hepatomegaly.  2 bruises present 1 in the epigastric 1 and left upper quadrant.   Genitourinary: Deferred.  Musculoskeletal: Tender to right trap, left wrist.  No gross deformity to extremities.  Neurologic: Normal speech and language. No gross focal neurologic deficits are appreciated. Patient is moving all extremities equally, face is symmetric at rest and with speech.  Skin: Skin is warm, dry and intact. No rash noted.  Psychiatric: Mood and affect are normal. Speech and behavior are normal.     Radiology     CT Cervical Spine Wo Contrast   Final Result      No acute fracture or traumatic listhesis of the cervical spine.         CT Maxillofacial Wo Contrast   Final Result      -Soft tissue swelling anterior to the right orbit. No evidence of acute fracture.      -DJD of the TMJs.               CT Head Wo Contrast   Final Result   No acute intracranial abnormalities.      Right periorbital soft tissue contusion/small hematoma.                Pertinent labs & imaging results that were available during my care of the patient were independently interpreted by me and considered in my medical decision making (see chart for details).    Portions of this record have been created using Scientist, clinical (histocompatibility and immunogenetics). Dictation errors have been sought, but may not have been identified and corrected.               [1]   Past Medical History:  Diagnosis Date    Anxiety     Arthritis     Asthma (HHS-HCC)     Cancer        Cataract     Chronic kidney disease     Depression     Difficult intravenous access     GERD (gastroesophageal reflux disease)     Heart failure        HLD (hyperlipidemia)     Hypertension     Myelofibrosis        Nephrolithiasis     Sleep apnea     Tinnitus    [2]   Past Surgical History:  Procedure Laterality Date    HYSTERECTOMY      PR CYSTO/URETERO W/LITHOTRIPSY &INDWELL STENT INSRT Left 08/21/2018    Procedure: priority CYSTOURETHROSCOPY, WITH URETEROSCOPY AND/OR PYELOSCOPY; WITH LITHOTRIPSY INCLUDING INSERTION OF INDWELLING URETERAL STENT;  Surgeon: Nicholaus Mt Viprakasit, MD;  Location: CYSTO PROCEDURE SUITES [facility];  Service: Urology    PR CYSTO/URETERO/PYELOSCOPY, CALCULUS TX Left 09/04/2018    Procedure: CYSTOURETHROSCOPY, W/URETEROSCOPY &/OR PYELOSCOPY; W/REMOVAL/MANIPULATION CALCULUS(URETERAL CATH INCLUDED);  Surgeon: Nicholaus Mt Viprakasit, MD;  Location: CYSTO PROCEDURE SUITES [facility];  Service: Urology    PR CYSTOSCOPY,INSERT URETERAL STENT Left 05/04/2022    Procedure: CYSTOURETHROSCOPY,  WITH INSERTION OF INDWELLING URETERAL STENT (EG, GIBBONS OR DOUBLE-J TYPE);  Surgeon: Drena Darryle Gull, MD;  Location: MAIN OR [facility];  Service: Urology    PR CYSTOSCOPY,REMV CALCULUS,SIMPLE Left 09/04/2018    Procedure: CYSTOURETHROSCOPY, WITH REMOVAL  OF FOREIGN BODY, CALCULUS OR URETERAL STENT FROM URETHRA OR BLADDER; SIMPLE;  Surgeon: Nicholaus Mt Viprakasit, MD;  Location: CYSTO PROCEDURE SUITES [facility];  Service: Urology    PR CYSTOURETHROSCOPY,URETER CATHETER Left 08/21/2018    Procedure: CYSTOURETHROSCOPY, W/URETERAL CATHETERIZATION, W/WO IRRIG, INSTILL, OR URETEROPYELOG, EXCLUS OF RADIOLG SVC;  Surgeon: Nicholaus Mt Viprakasit, MD;  Location: CYSTO PROCEDURE SUITES [facility]; Service: Urology    PR CYSTOURETHROSCOPY,URETER CATHETER Left 09/04/2018    Procedure: CYSTOURETHROSCOPY, W/URETERAL CATHETERIZATION, W/WO IRRIG, INSTILL, OR URETEROPYELOG, EXCLUS OF RADIOLG SVC;  Surgeon: Nicholaus Mt Viprakasit, MD;  Location: CYSTO PROCEDURE SUITES [place];  Service: Urology    PR PERQ DILATION XST TRC NEW ACCESS RENAL COLTJ SYS Left 04/24/2022    Procedure: DILATION OF EXISTING TRACT, PERC, FOR ENDOUROLOGIC PROC INCL IMAGING GUIDANCE & RADIOLOGIC SUPERVISION & INTERPRET, W POSTPROC TUBE PLACEMENT, WHEN PERF; INCL NEW ACCESS INTO RENAL COLLECTING SYSTEM;  Surgeon: Mitch Alm Pickles, MD;  Location: [facility] OR [facility];  Service: Urology    PR PERQ NL/PL LITHOTRP COMPLEX >2 CM MLT LOCATIONS Left 04/24/2022    Procedure: standard PCNL prone vs. standard; need fortec;  Surgeon: Mitch Alm Pickles, MD;  Location: [facility] OR [facility];  Service: Urology    PR REMOVE BLADDER STONE,<2.5 CM N/A 08/21/2018    Procedure: LITHOLAPAXY: CRUSH/FRAGMENT CALCULUS, BY ANY MEANS IN BLADDER & REMOVAL OF FRAGMENTS; SIMPLE/SMALL <2.5 CM;  Surgeon: Nicholaus Mt Viprakasit, MD;  Location: CYSTO PROCEDURE SUITES [facility];  Service: Urology    SALPINGOOPHORECTOMY Right     12/07/2010    VAGINAL HYSTERECTOMY      12/07/2010   [3]   Family History  Problem Relation Age of Onset    Hypertension Mother     Cancer Brother         bone    Hypertension Brother     Hypertension Brother     Hypertension Brother     Anesthesia problems Neg Hx     Bleeding Disorder Neg Hx     Breast cancer Neg Hx     Ovarian cancer Neg Hx     Colon cancer Neg Hx     Endometrial cancer Neg Hx    [4]   Social History  Tobacco Use    Smoking status: Never    Smokeless tobacco: Never   Vaping Use    Vaping status: Never Used   Substance Use Topics    Alcohol use: No    Drug use: No        Correne Bernardino LABOR, MD  Resident  08/21/23 [phone]

## 2023-08-21 NOTE — Unmapped (Signed)
 Here for mechanical fall, tripped on sidewalk and hit head on ground. No BT or LOC. Bruising around right eye, still able to see out of it.

## 2023-08-22 DIAGNOSIS — D7581 Myelofibrosis: Principal | ICD-10-CM

## 2023-08-22 LAB — CBC W/ DIFFERENTIAL
BASOPHILS ABSOLUTE COUNT: 1.6 x10E3/uL — ABNORMAL HIGH (ref 0.0–0.2)
BASOPHILS RELATIVE PERCENT: 6 %
EOSINOPHILS ABSOLUTE COUNT: 1.9 x10E3/uL — ABNORMAL HIGH (ref 0.0–0.4)
EOSINOPHILS RELATIVE PERCENT: 7 %
HEMATOCRIT: 39.7 % (ref 34.0–46.6)
HEMOGLOBIN: 12.1 g/dL (ref 11.1–15.9)
LYMPHOCYTES ABSOLUTE COUNT: 4.1 x10E3/uL — ABNORMAL HIGH (ref 0.7–3.1)
LYMPHOCYTES RELATIVE PERCENT: 15 %
MEAN CORPUSCULAR HEMOGLOBIN CONC: 30.5 g/dL — ABNORMAL LOW (ref 31.5–35.7)
MEAN CORPUSCULAR HEMOGLOBIN: 27.4 pg (ref 26.6–33.0)
MEAN CORPUSCULAR VOLUME: 90 fL (ref 79–97)
MONOCYTES ABSOLUTE COUNT: 0 x10E3/uL — ABNORMAL LOW (ref 0.1–0.9)
MONOCYTES RELATIVE PERCENT: 0 %
NEUTROPHILS ABSOLUTE COUNT: 16.5 x10E3/uL — ABNORMAL HIGH (ref 1.4–7.0)
NEUTROPHILS RELATIVE PERCENT: 61 %
NUCLEATED RED BLOOD CELLS: 9 % — ABNORMAL HIGH (ref 0–0)
PLATELET COUNT: 13 x10E3/uL — ABNORMAL LOW (ref 150–450)
RED BLOOD CELL COUNT: 4.41 x10E6/uL (ref 3.77–5.28)
RED CELL DISTRIBUTION WIDTH: 19 % — ABNORMAL HIGH (ref 11.7–15.4)
WHITE BLOOD CELL COUNT: 27.1 x10E3/uL — ABNORMAL HIGH (ref 3.4–10.8)

## 2023-08-22 LAB — COMPREHENSIVE METABOLIC PANEL
ALBUMIN: 4.3 g/dL (ref 3.8–4.8)
ALKALINE PHOSPHATASE: 98 IU/L (ref 44–121)
ALT (SGPT): 10 IU/L (ref 0–32)
AST (SGOT): 22 IU/L (ref 0–40)
BILIRUBIN TOTAL (MG/DL) IN SER/PLAS: 0.8 mg/dL (ref 0.0–1.2)
BLOOD UREA NITROGEN: 27 mg/dL (ref 8–27)
BUN / CREAT RATIO: 17 (ref 12–28)
CALCIUM: 8.8 mg/dL (ref 8.7–10.3)
CHLORIDE: 101 mmol/L (ref 96–106)
CO2: 21 mmol/L (ref 20–29)
CREATININE: 1.6 mg/dL — ABNORMAL HIGH (ref 0.57–1.00)
EGFR: 33 mL/min/1.73 — ABNORMAL LOW
GLOBULIN, TOTAL: 3.3 g/dL (ref 1.5–4.5)
GLUCOSE: 102 mg/dL — ABNORMAL HIGH (ref 70–99)
POTASSIUM: 4.6 mmol/L (ref 3.5–5.2)
SODIUM: 141 mmol/L (ref 134–144)
TOTAL PROTEIN: 7.6 g/dL (ref 6.0–8.5)

## 2023-08-22 LAB — IMMATURE CELLS
METAMYLOCYTES-LABCORP: 5 % — ABNORMAL HIGH (ref 0–0)
MYELOCYTES: 6 % — ABNORMAL HIGH (ref 0–0)

## 2023-08-22 MED FILL — OJJAARA 100 MG TABLET: ORAL | 30 days supply | Qty: 30 | Fill #2

## 2023-08-22 NOTE — Unmapped (Signed)
 Copied from CRM #[id number]. Topic: Symptom Management - Critical Lab Values  >> Aug 22, 2023 12:52 PM Almeda NOVAK wrote:      Emily Walter with [employer] has contacted the [employer] to report critical lab results.    Please contact Walter at [phone] opt 1 to confirm results.  Reference number is [id number]    I have sent you a page for your reference as well.      Thank you,  Almeda GORMAN Avena  [facility]  [phone]

## 2023-08-24 DIAGNOSIS — D7581 Myelofibrosis: Principal | ICD-10-CM

## 2023-08-30 DIAGNOSIS — M25561 Pain in right knee: Principal | ICD-10-CM

## 2023-08-30 DIAGNOSIS — W19XXXA Unspecified fall, initial encounter: Principal | ICD-10-CM

## 2023-08-30 NOTE — Unmapped (Signed)
 [facility] at [facility]     Reason for visit: Follow up    Questions / Concerns that need to be addressed: s/p ER visit r/t s/p fall.    Screening BP- 116/62, 80      HCDM reviewed and updated in Epic:    We are working to make sure all of our patients??? wishes are updated in Epic and part of that is documenting a Environmental health practitioner for each patient  A Health Care Decision Maker is someone you choose who can make health care decisions for you if you are not able - who would you most want to do this for you????  is already up to date.    HCDM (patient stated preference): Emily Walter, Emily Walter - Spouse - [phone]    HCDM, First AlternateBETHA Zachary Heck - Son [phone]    BPAs completed:  PHQ9    Annual Screenings:     __________________________________________________________________________________________    SCREENINGS COMPLETED IN FLOWSHEETS      AUDIT       PHQ2       PHQ9          GAD7       COPD Assessment       Falls Risk

## 2023-08-30 NOTE — Unmapped (Addendum)
 It was pleasure seeing you in clinic today!    Please contact our clinic if you would be interested in participating in a falls physical therapy program!    Below is the phone number for the [facility] for you to call and schedule the ultrasound of your knee.  [phone]

## 2023-08-30 NOTE — Unmapped (Signed)
 Internal Medicine Clinic Visit    Reason for visit: ER f/u after fall with headstrike    Assessment & Plan  Fall, initial encounter  Fall due to misstep onto a curb on 7/15 with subsequent visit to the ED. Not on anticoagulation. Platelets were 18 at the ED, baseline count appears to be around 13-15 in the setting of myelofibrosis. Imaging of head, face, and neck pertinent only for periorbital edema and small hematoma. No acute intracranial pathology, facial or cervical fractures. Physical exam pertinent for extensive ecchymoses of the face, neck, upper, and lower extremities. Pain controlled with Percocet prescribed by pain management.  - Discussed fall physical therapy, patient defers at this time  - Counseled patient on fall precautions  - Continue Percocet per pain management       Acute pain of right knee  Seen 7/3 for acute right knee pain. X-ray right knee ordered consistent with OA. US  right knee ordered at the same time but incomplete due to fall and visit to ED on the day it was previously scheduled.  - Discussed rescheduling US  on the way out of the building today and provided phone number for [facility]  - Follow up results            Staffed with Dr. Marvis, seen and discussed    __________________________________________________________    HPI:    Emily Walter is a 73 year old female with a history of myelofibrosis, HFpEF, severe OSA, CKD3, HLD, GERD, MDD, RLS, OA, and prediabetes who presents today for ER follow-up after a fall on 7/15 with head strike, no LOC.    Patient visited [facility] ED 7/15 after a fall onto the side walk with head strike, no LOC. She was on her way to complete an US  of her knee for acute right knee pain she has been experiencing. She reports some transient blurred vision which she attributes to the brightness of the sun outside. She had a misstep onto a curb when she fell onto her face, left wrist, right shoulder and breast. In the ED she had a negative CT head, maxillofacial, and cervical spine for acute pathology. Imaging noted right eye periorbital edema/ hematoma. Discharged from ED with concussion precautions.    Since being discharged home from the ED, she has noticed some healing of the bruising around her eye, along her upper extremities, abdomen, and lower extremities. She and her husband have noted a new bruise along her right SCM that they don't recall being present on the day of the fall. She follows with the pain clinic and takes oxycodone -acetaminophen. Rx has helped manage the pain from her fall, is to f/u with pain management on 8/26. She has not been wearing her glasses due to facial pain. Patient has not been going anywhere, mostly staying at home. She has a walker as well as a cane at home. Her husband reports taking extra precautions to help her get around. She has not had any additional falls since. Denies blurred vision, headaches, lightheadedness, dizziness.     __________________________________________________________        Medications:  Reviewed in EPIC  __________________________________________________________    Physical Exam:   Vital Signs:  Vitals:    08/30/23 1042   BP: 116/62   BP Site: L Arm   BP Position: Sitting   BP Cuff Size: Large   Pulse: 80   Resp: 18   Temp: 36.1 ??C (97 ??F)   TempSrc: Temporal   SpO2: 95%   Weight: 98.3 kg (  216 lb 12.8 oz)   Height: 160 cm (5' 3)          Gen: Well appearing, NAD  HEENT: Right periorbital ecchymosis. Conjunctival hemorrhage of the right eye. Bilateral edema along the nasal bridge.  Neck: Ecchymosis along the right SCM  Pulm: Normal work of breathing  Abdomen: Scattered ecchymoses on the trunk and right breast  Ext: Scattered ecchymoses along the right upper extremity, bilateral hands, and right lower extremity. No edema.    PHQ-9 Score:  PHQ-9 Total Score: 5    Medication adherence and barriers to the treatment plan have been addressed. Opportunities to optimize healthy behaviors have been discussed. Patient / caregiver voiced understanding.      Tinnie Lute, MD  Internal Medicine, PGY-1

## 2023-09-09 NOTE — Unmapped (Signed)
 I saw and evaluated the patient, participating in the key portions of the service.  I reviewed the resident???s note.  I agree with the resident???s findings and plan. Hazeline Junker, MD

## 2023-09-11 DIAGNOSIS — R42 Dizziness and giddiness: Principal | ICD-10-CM

## 2023-09-11 DIAGNOSIS — W19XXXD Unspecified fall, subsequent encounter: Principal | ICD-10-CM

## 2023-09-11 MED ORDER — DICLOFENAC 1 % TOPICAL GEL
Freq: Four times a day (QID) | TOPICAL | 2 refills | 7.00000 days | Status: CP | PRN
Start: 2023-09-11 — End: ?

## 2023-09-11 NOTE — Unmapped (Signed)
 I saw and evaluated the patient, participating in the key portions of the service.?? I reviewed the resident???s note.?? I agree with the resident???s findings and plan. Jacquiline Doe, MD

## 2023-09-11 NOTE — Unmapped (Signed)
 Vitals:    09/11/23 1511 09/11/23 1555 09/11/23 1556 09/11/23 1559   Orthostatic BP:  136/66 129/68 123/61   Orthostatic Pulse:  84 86 88   BP Site: L Arm L Arm L Arm L Arm   BP Position: Sitting Supine Sitting Sitting   BP Cuff Size: Large Large Large Large

## 2023-09-11 NOTE — Unmapped (Signed)
 [facility] SAME DAY CLINIC NOTE     09/11/2023    PCP: Emily Emmie Dais, MD     Assessment and Plan    [name] was seen today for fall.    Diagnoses and all orders for this visit:    Dizziness  -     Orthostatic blood pressure  -     Ambulatory referral to Neurology; Future  -     Ambulatory referral to ENT; Future    Fall, subsequent encounter  -     diclofenac  sodium (VOLTAREN ) 1 % gel; Apply 4 g topically four (4) times a day as needed for arthritis or pain (Knee pain).      Dizziness, falls  Persistent dizziness ~1 yr since L ear hearing loss, leading to imbalance and falls, no LOC. Extensively worked-up w negative MRIH, CTH. Seen by ENT, underwent vestibular therapy w/o improvement. Has L sensorineural hearing loss, tinnitus, dizziness described as head is spinning but not room. Triad of sx most consistent w Meniere's disease, consider BPPV, labyrinthitis. No nystagmus noted on exam. Negative romberg's  - orthostatics performed, negative.  - follow-up with ENT, new referral made  - referred to neurology  - follow-up w PCP in 1 mo  - provided resource regarding meniere's disease  - provided resource for epley maneuver in consideration of bppv  - counseled on low sodium intake, limit caffeine. Does not smoke  - continue to take home lasix  and spironolactone , as diuretics can be used to manage Meniere's by endolymph accumulation in inner ear, thereby reducing inner ear pressure  - refilled voltaren  for MSK pain d/t falls    Follow up as scheduled or sooner as needed.    Patient was seen and discussed with Dr. Myrna who is in agreement with the assessment and plan as outlined above.     Subjective    Problem List:  Problem List[1]    HPI  Chief Complaint   Patient presents with    Emily Walter is a 73 y.o. year old female with the above problem list including myelofibrosis who presents to the same day clinic for dizziness and falls. Seen in clinic 7/24 for ED follow-up for fall d/t misstep on curb. Fell again 7/24 at home.  History of Present Illness  She has experienced persistent dizziness for over a year, worsening in the past month, described head spinning but room not spinning. Has fallen on several occasions, no LOC. Significant bruising on face and back d/t falls and hx of myelofibrosis w low platelets. She uses a cane when walking.    The dizziness began after a sudden loss of hearing in her left ear in April of the previous year, with no current hearing in that ear. Seen by ENT. She underwent vestibular therapy with no improvement. Occasional tinnitus occurs primarily in one ear a few times a week. No notable new meds or changes in meds.    No headaches, nausea or vomiting. No worsening blurry vision. No changes in dizziness when sitting vs standing. No recent fevers, chills, diarrhea, constipation.     Meds and allergies were reviewed in Epic    ROS: 10 point ROS was performed and is otherwise negative other than mentioned in the HPI    Objective  PE:  Vitals:    09/11/23 1511   BP: 124/63   Pulse: 86   Temp: 36.6 ??C (97.9 ??F)   SpO2: 97%     General:  uncomfortable-appearing  Eyes: EOMI, sclera clear, PERRL  ENT: OP clear w/o erythema or exudate  CV: regular, no murmurs  Resp: NWOB  GI: soft, NTND, NABS  MSK: full ROM, no deformity noted  Skin: petechiae over face, back  Ext: no cyanosis/clubbing/edema  Neuro: CN II-XII intact, no nystagmus, negative romberg, strength 5/5 distal & proximal BUE and BLE, no loss of sensation    Procedure: None  See procedure note from this encounter    _______________________________________________________________________________________    Same Day Metric Tracker:    Same Day Metric Tracker:  Referral source for today's visit:  none    Referral to other outpatient urgent services? N/A    Did today's visit result in referral to ED or direct admission? No              [1]   Patient Active Problem List  Diagnosis    White coat syndrome with diagnosis of hypertension    Mixed urge and stress incontinence    Restless leg syndrome    Depression    Myelofibrosis       GERD (gastroesophageal reflux disease)    History of nephrolithiasis    Stage 3a chronic kidney disease (CMS-HCC)    Lumbar stenosis with neurogenic claudication    Osteoarthritis    Thrombocytopenia    Hyperlipidemia    Severe obstructive sleep apnea    Chronic heart failure with preserved ejection fraction       Prediabetes    Iron  deficiency anemia    Flank pain    Gallstones    Recurrent UTI    MDS/MPN (myelodysplastic/myeloproliferative neoplasms)

## 2023-09-13 ENCOUNTER — Emergency Department
Admit: 2023-09-13 | Discharge: 2023-09-14 | Disposition: A | Discharge status: Home / Self Care | Payer: Medicare (Managed Care)

## 2023-09-13 DIAGNOSIS — D7581 Myelofibrosis: Principal | ICD-10-CM

## 2023-09-13 LAB — URINALYSIS WITH MICROSCOPY WITH CULTURE REFLEX PERFORMABLE
BILIRUBIN UA: NEGATIVE
GLUCOSE UA: NEGATIVE
KETONES UA: NEGATIVE
NITRITE UA: NEGATIVE
PH UA: 5.5 (ref 5.0–9.0)
PROTEIN UA: NEGATIVE
RBC UA: 63 /HPF — ABNORMAL HIGH (ref <=4)
SPECIFIC GRAVITY UA: 1.014 (ref 1.003–1.030)
SQUAMOUS EPITHELIAL: 2 /HPF (ref 0–5)
UROBILINOGEN UA: <2
WBC UA: <1 /HPF (ref 0–5)

## 2023-09-13 LAB — CBC W/ AUTO DIFF
BASOPHILS ABSOLUTE COUNT: 0.7 10*9/L — ABNORMAL HIGH (ref 0.0–0.1)
BASOPHILS RELATIVE PERCENT: 3 %
EOSINOPHILS ABSOLUTE COUNT: 1.5 10*9/L — ABNORMAL HIGH (ref 0.0–0.5)
EOSINOPHILS RELATIVE PERCENT: 6 %
HEMATOCRIT: 34.3 % (ref 34.0–44.0)
HEMOGLOBIN: 11.1 g/dL — ABNORMAL LOW (ref 11.3–14.9)
LYMPHOCYTES ABSOLUTE COUNT: 1.6 10*9/L (ref 1.1–3.6)
LYMPHOCYTES RELATIVE PERCENT: 6.5 %
MEAN CORPUSCULAR HEMOGLOBIN CONC: 32.3 g/dL (ref 32.0–36.0)
MEAN CORPUSCULAR HEMOGLOBIN: 27.2 pg (ref 25.9–32.4)
MEAN CORPUSCULAR VOLUME: 84 fL (ref 77.6–95.7)
MEAN PLATELET VOLUME: 9.4 fL (ref 6.8–10.7)
MONOCYTES ABSOLUTE COUNT: 0.7 10*9/L (ref 0.3–0.8)
MONOCYTES RELATIVE PERCENT: 2.7 %
NEUTROPHILS ABSOLUTE COUNT: 20.3 10*9/L — ABNORMAL HIGH (ref 1.8–7.8)
NEUTROPHILS RELATIVE PERCENT: 81.8 %
NUCLEATED RED BLOOD CELLS: 3 /100{WBCs} (ref <=4)
PLATELET COUNT: 19 10*9/L — ABNORMAL LOW (ref 150–450)
RED BLOOD CELL COUNT: 4.09 10*12/L (ref 3.95–5.13)
RED CELL DISTRIBUTION WIDTH: 20.6 % — ABNORMAL HIGH (ref 12.2–15.2)
WBC ADJUSTED: 24.8 10*9/L — ABNORMAL HIGH (ref 3.6–11.2)

## 2023-09-13 LAB — COMPREHENSIVE METABOLIC PANEL
ALBUMIN: 3.8 g/dL (ref 3.4–5.0)
ALKALINE PHOSPHATASE: 94 U/L (ref 46–116)
ALT (SGPT): 7 U/L — ABNORMAL LOW (ref 10–49)
ANION GAP: 15 mmol/L — ABNORMAL HIGH (ref 5–14)
AST (SGOT): 18 U/L (ref <=34)
BILIRUBIN TOTAL: 1.2 mg/dL (ref 0.3–1.2)
BLOOD UREA NITROGEN: 22 mg/dL (ref 9–23)
BUN / CREAT RATIO: 12
CALCIUM: 9.3 mg/dL (ref 8.7–10.4)
CHLORIDE: 99 mmol/L (ref 98–107)
CO2: 32.2 mmol/L — ABNORMAL HIGH (ref 20.0–31.0)
CREATININE: 1.81 mg/dL — ABNORMAL HIGH (ref 0.55–1.02)
EGFR CKD-EPI (2021) FEMALE: 29 mL/min/1.73m2 — ABNORMAL LOW (ref >=60)
GLUCOSE RANDOM: 112 mg/dL (ref 70–179)
POTASSIUM: 4.2 mmol/L (ref 3.4–4.8)
PROTEIN TOTAL: 7.8 g/dL (ref 5.7–8.2)
SODIUM: 146 mmol/L — ABNORMAL HIGH (ref 135–145)

## 2023-09-13 LAB — PROTIME-INR
INR: 1.24
PROTIME: 14.1 s — ABNORMAL HIGH (ref 9.9–12.6)

## 2023-09-13 LAB — APTT
APTT: 30.4 s (ref 24.8–38.4)
HEPARIN CORRELATION: 0.2

## 2023-09-13 MED ORDER — MOMELOTINIB 100 MG TABLET
ORAL_TABLET | Freq: Every day | ORAL | 2 refills | 30.00000 days | Status: CP
Start: 2023-09-13 — End: ?
  Filled 2023-09-20: qty 30, 30d supply, fill #0

## 2023-09-13 NOTE — Unmapped (Signed)
 [facility] Refill Coordination Note    Specialty Medication(s) to be Shipped:   Hematology/Oncology: OJJAARA  100 mg tablet (momelotinib)    Other medication(s) to be shipped: No additional medications requested for fill at this time     Emily Walter, DOB: 1950/04/27  Phone: [phone] (home) [phone] (work)      All above HIPAA information was verified with patient.     Was a Nurse, learning disability used for this call? No    Completed refill call assessment today to schedule patient's medication shipment from the [facility]  [phone]).  All relevant notes have been reviewed.     Specialty medication(s) and dose(s) confirmed: Regimen is correct and unchanged.   Changes to medications: Piccola reports no changes at this time.  Changes to insurance: No  New side effects reported not previously addressed with a pharmacist or physician: None reported  Questions for the pharmacist: No    Confirmed patient received a Conservation officer, historic buildings and a Surveyor, mining with first shipment. The patient will receive a drug information handout for each medication shipped and additional FDA Medication Guides as required.       DISEASE/MEDICATION-SPECIFIC INFORMATION        N/A    SPECIALTY MEDICATION ADHERENCE     Medication Adherence    Patient reported X missed doses in the last month: 0  Specialty Medication: OJJAARA  100 mg tablet (momelotinib)  Patient is on additional specialty medications: No              Were doses missed due to medication being on hold? No    OJJAARA  100 mg tablet (momelotinib): 15 days of medicine on hand       REFERRAL TO PHARMACIST     Referral to the pharmacist: Not needed      [facility]     Shipping address confirmed in Epic.     Cost and Payment: Patient has a $0 copay, payment information is not required.    Delivery Scheduled: Yes, Expected medication delivery date: 09/21/23.  However, Rx request for refills was sent to the provider as there are none remaining.     Medication will be delivered via UPS to the prescription address in Epic WAM.    Nikholas Geffre   [facility]  Specialty Technician

## 2023-09-13 NOTE — Unmapped (Signed)
 [facility]  Emergency Department Provider Note     ED Clinical Impression     Final diagnoses:   Nephrolithiasis (Primary)   Mesenteric panniculitis      Adnexal cyst      Impression, Medical Decision Making, ED Course     Impression: 73 y.o. female with PMH most significant for myelofibrosis, CKD, HF, nephrolithiasis, UTI, HTN, HLD, GERD who presents with RLQ and epigastric pain that began today as described below.     DDx/MDM: 73 yo female with PMH of myelofibrosis, CKD, HF, nephrolithiasis, UTI, HTN, HLD, GERD who presents with RLQ and epigastric pain that began today. Patient describes pain as similar to prior nephrolithiasis. Patient was seen here in the ED on 08/21/23 for a fall, CT head was negative at that time. She has since been seen by her PCP who has her referred to neurology and ENT. She has not had any falls prior to her visit with her PCP and is functioning at her baseline. On exam, there is LLQ tenderness. There is ecchymosis noted to the face and right upper thoracic back from prior fall. Hematology is monitoring these bruises. No CVA tenderness. She is non-toxic appearing. Vitals are WNL. Differential diagnosis includes but is not limited to  UTI, kidney stones, pyelonephritis, cystitis, appendicitis, diverticulitis, cholecystitis. CBC, CMP, UA, CT a/p were ordered to rule out above diagnoses.     UA did show blood in the urine making nephrolithiasis more likely, CT a/p confirms this. Creatinine slightly elevated compared to baseline at 1.81. Patient given 1L NS here in the ED. She was also counseled on oral hydration at home. CT also shows mild mesenteric panniculitis, small umbilical hernia, and ovarian cysts. Discussed this thoroughly with the patient. She will follow up with her PCP in the next one week to schedule an appointment for abdominal ultrasound in one month to further evaluate adnexal cysts. CBC shows elevated WBC at 24.8, this is baseline for the patient and down trending from 3 weeks ago when WBC was 30.8. PLT low at 19, this is also baseline for the patient. Hematology and oncology are following this. All labs and imaging were discussed with the patient. She will continue to hydrate at home and follow up with her PCP in one week. She was given strict return precautions. Will discharge home.       Orders Placed This Encounter   Procedures    Urine Culture    CT abdomen pelvis without contrast    Urinalysis with Microscopy with Culture Reflex    CBC w/ Differential    Comprehensive Metabolic Panel    PT-INR    PTT    Pathologist Smear Review    Temperature check    Type and Screen with Confirmation ABORh    Insert peripheral IV       ED Course as of 09/14/23 0243   Thu Sep 13, 2023   2241 Creatinine(!): 1.81  Elevated from prior 1.58 3 weeks ago   2243 IMPRESSION:     - Mild mesenteric panniculitis.     - Similar size of the bilateral adnexal cystic lesions which are indeterminate in etiology. Given the patient's age, consider further evaluation with dedicated pelvic ultrasound on a nonemergent basis.     - Deep inguinal and iliac chain lymphadenopathy and spleen in this patient with history of myelofibrosis.     - Hyperattenuating material within the renal calyces and collecting systems and layering in the bladder may reflect milk of calcium  stones.     - Small fat-containing umbilical hernia with mild stranding of the herniated fat, which may reflect early or intermittent vascular compromise.        FOLLOW-UP RECOMMENDATION:     Item for Follow Up: Cystic adnexal lesions  1. Acuity: Non-urgent  2. Modality: US   3. Anatomy: Abdomen  4. TimeFrame: 1 Month     Fri Sep 14, 2023   0232 WBC(!): 24.8       MDM Elements      I have reviewed recent and relevant previous record, including: Inpatient notes - prior ED visit and Outpatient notes - hematology notes          ____________________________________________    The case was discussed with the attending physician, who is in agreement with the above assessment and plan.      History     Chief Complaint  Chief Complaint   Patient presents with    Flank Pain       HPI   Emily Walter is a 73 y.o. female with past medical history as below who presents with left lower abdominal pain. Patient states the pain radiates into her left flank. The pain started two days ago. She denies any urinary symptoms. States pain is similar to pain she felt with prior kidney stones. Denies fever, chills, nausea, or vomiting, or diarrhea. She denies blood in the urine or stool. Denies midline back pain. Patient did have a fall 3 weeks ago when she was seen in the ED. She has been following with her PCP. Denies any chest pain, dizziness, or shortness of breath. Denies vaginal bleeding. She continues on Ojjaara  for myelofibrosis and has an upcoming appointment with oncology in two weeks.    Outside Historian(s): I have obtained additional history/collateral from husband.    Past Medical History[1]    Past Surgical History[2]    Active Medications[3]     Allergies[4]    Family History[5]    Short Social History[6]     Physical Exam     VITAL SIGNS:      Vitals:    09/13/23 1655 09/13/23 1656 09/13/23 2116   BP: 153/69  118/58   Pulse: 88  74   Resp: 20  18   Temp:   36.8 ??C (98.2 ??F)   TempSrc:   Oral   SpO2:  96% 96%       Constitutional: Alert and oriented. No acute distress.  Eyes: Conjunctivae are normal.  HEENT: Normocephalic and atraumatic. Conjunctivae clear. No congestion. Moist mucous membranes.   Cardiovascular: Rate as above, regular rhythm. Normal and symmetric distal pulses. Brisk capillary refill. Normal skin turgor.  Respiratory: Normal respiratory effort. Breath sounds are normal. There are no wheezing or crackles heard.  Gastrointestinal: Soft, non-distended, +LLQ tenderness, no CVA tenderness.  Genitourinary: Deferred.  Musculoskeletal: Non-tender with normal range of motion in all extremities.  Neurologic: Normal speech and language. No gross focal neurologic deficits are appreciated. Patient is moving all extremities equally, face is symmetric at rest and with speech.  Skin: Skin is warm, dry and intact. No rash noted. There is ecchymosis noted to the bilateral lower eyelids. There is also ecchymosis to the right thoracic region.   Psychiatric: Mood and affect are normal. Speech and behavior are normal.     Radiology     CT abdomen pelvis without contrast   Final Result      - Mild mesenteric panniculitis.      - Similar size  of the bilateral adnexal cystic lesions which are indeterminate in etiology. Given the patient's age, consider further evaluation with dedicated pelvic ultrasound on a nonemergent basis.      - Deep inguinal and iliac chain lymphadenopathy and spleen in this patient with history of myelofibrosis.      - Hyperattenuating material within the renal calyces and collecting systems and layering in the bladder may reflect milk of calcium stones.      - Small fat-containing umbilical hernia with mild stranding of the herniated fat, which may reflect early or intermittent vascular compromise.         FOLLOW-UP RECOMMENDATION:      Item for Follow Up: Cystic adnexal lesions   1. Acuity: Non-urgent   2. Modality: US    3. Anatomy: Abdomen   4. TimeFrame: 1 Month          Pertinent labs & imaging results that were available during my care of the patient were independently interpreted by me and considered in my medical decision making (see chart for details).    Portions of this record have been created using Scientist, clinical (histocompatibility and immunogenetics). Dictation errors have been sought, but may not have been identified and corrected.           [1]   Past Medical History:  Diagnosis Date    Anxiety     Arthritis     Asthma (HHS-HCC)     Cancer        Cataract     Chronic kidney disease     Depression     Difficult intravenous access     GERD (gastroesophageal reflux disease)     Heart failure        HLD (hyperlipidemia)     Hypertension     Myelofibrosis        Nephrolithiasis Sleep apnea     Tinnitus    [2]   Past Surgical History:  Procedure Laterality Date    HYSTERECTOMY      PR CYSTO/URETERO W/LITHOTRIPSY &INDWELL STENT INSRT Left 08/21/2018    Procedure: priority CYSTOURETHROSCOPY, WITH URETEROSCOPY AND/OR PYELOSCOPY; WITH LITHOTRIPSY INCLUDING INSERTION OF INDWELLING URETERAL STENT;  Surgeon: Nicholaus Mt Viprakasit, MD;  Location: CYSTO PROCEDURE SUITES [facility];  Service: Urology    PR CYSTO/URETERO/PYELOSCOPY, CALCULUS TX Left 09/04/2018    Procedure: CYSTOURETHROSCOPY, W/URETEROSCOPY &/OR PYELOSCOPY; W/REMOVAL/MANIPULATION CALCULUS(URETERAL CATH INCLUDED);  Surgeon: Nicholaus Mt Viprakasit, MD;  Location: CYSTO PROCEDURE SUITES [facility];  Service: Urology    PR CYSTOSCOPY,INSERT URETERAL STENT Left 05/04/2022    Procedure: CYSTOURETHROSCOPY,  WITH INSERTION OF INDWELLING URETERAL STENT (EG, GIBBONS OR DOUBLE-J TYPE);  Surgeon: Drena Darryle Gull, MD;  Location: MAIN OR [facility];  Service: Urology    PR CYSTOSCOPY,REMV CALCULUS,SIMPLE Left 09/04/2018    Procedure: CYSTOURETHROSCOPY, WITH REMOVAL OF FOREIGN BODY, CALCULUS OR URETERAL STENT FROM URETHRA OR BLADDER; SIMPLE;  Surgeon: Nicholaus Mt Viprakasit, MD;  Location: CYSTO PROCEDURE SUITES [facility]  Service: Urology    PR CYSTOURETHROSCOPY,URETER CATHETER Left 08/21/2018    Procedure: CYSTOURETHROSCOPY, W/URETERAL CATHETERIZATION, W/WO IRRIG, INSTILL, OR URETEROPYELOG, EXCLUS OF RADIOLG SVC;  Surgeon: Nicholaus Mt Viprakasit, MD;  Location: CYSTO PROCEDURE SUITES [facility];  Service: Urology    PR CYSTOURETHROSCOPY,URETER CATHETER Left 09/04/2018    Procedure: CYSTOURETHROSCOPY, W/URETERAL CATHETERIZATION, W/WO IRRIG, INSTILL, OR URETEROPYELOG, EXCLUS OF RADIOLG SVC;  Surgeon: Nicholaus Mt Viprakasit, MD;  Location: CYSTO PROCEDURE SUITES [facility];  Service: Urology    PR PERQ DILATION XST TRC NEW ACCESS RENAL COLTJ SYS Left 04/24/2022    Procedure: DILATION  OF EXISTING TRACT, PERC, FOR ENDOUROLOGIC PROC INCL IMAGING GUIDANCE & RADIOLOGIC SUPERVISION & INTERPRET, W POSTPROC TUBE PLACEMENT, WHEN PERF; INCL NEW ACCESS INTO RENAL COLLECTING SYSTEM;  Surgeon: Mitch Alm Pickles, MD;  [facility];  Service: Urology    PR PERQ NL/PL LITHOTRP COMPLEX >2 CM MLT LOCATIONS Left 04/24/2022    Procedure: standard PCNL prone vs. standard; need fortec;  Surgeon: Mitch Alm Pickles, MD;  Location: [facility] OR [facility];  Service: Urology    PR REMOVE BLADDER STONE,<2.5 CM N/A 08/21/2018    Procedure: LITHOLAPAXY: CRUSH/FRAGMENT CALCULUS, BY ANY MEANS IN BLADDER & REMOVAL OF FRAGMENTS; SIMPLE/SMALL <2.5 CM;  Surgeon: Nicholaus Mt Viprakasit, MD;  Location: CYSTO PROCEDURE SUITES [facility];  Service: Urology    SALPINGOOPHORECTOMY Right     12/07/2010    VAGINAL HYSTERECTOMY      12/07/2010   [3]   No current facility-administered medications for this encounter.     Current Outpatient Medications   Medication Sig Dispense Refill    albuterol  HFA 90 mcg/actuation inhaler Inhale 2 puffs every six (6) hours as needed for wheezing or shortness of breath. 8 g 3    cholecalciferol , vitamin D3-50 mcg, 2,000 unit,, 50 mcg (2,000 unit) tablet Take 1 tablet (50 mcg total) by mouth in the morning.      conjugated estrogens  (PREMARIN ) 0.625 mg/gram vaginal cream Insert 0.5 g into the vagina Two (2) times a week. 30 g 1    diclofenac  sodium (VOLTAREN ) 1 % gel Apply 4 g topically four (4) times a day as needed for arthritis or pain (Knee pain). 100 g 2    fluticasone propionate (FLONASE) 50 mcg/actuation nasal spray 2 sprays into each nostril daily as needed for allergies.      furosemide  (LASIX ) 40 MG tablet Take 1 tablet (40 mg total) by mouth daily. 90 tablet 3    levocetirizine (XYZAL) 5 MG tablet Take 1 tablet (5 mg total) by mouth every evening. 90 tablet 3    lidocaine (LIDODERM) 5 % patch Place 1 patch on the skin daily. Apply to affected area for 12 hours only each day (then remove patch)      lovastatin  (MEVACOR ) 40 MG tablet Take 1 tablet (40 mg total) by mouth daily. 90 tablet 1    miscellaneous medical supply Misc Rollator for daily use with ambulation dx: M48.061      momelotinib (OJJAARA ) 100 mg tablet Take 1 tablet (100 mg total) by mouth daily. Swallow tablets whole; do not cut, crush, or chew. 30 tablet 2    NARCAN 4 mg/actuation nasal spray 1 spray into alternating nostrils once as needed.  0    omeprazole  (PRILOSEC) 40 MG capsule Take 1 capsule (40 mg total) by mouth daily. 90 capsule 3    ondansetron  (ZOFRAN -ODT) 4 MG disintegrating tablet Dissolve 1 tablet (4 mg total) in the mouth every eight (8) hours as needed for nausea. 15 tablet 3    oxyCODONE -acetaminophen (PERCOCET) 10-325 mg per tablet Take 1 tablet by mouth every six (6) hours as needed for pain.      polyethylene glycol (GOLYTELY ) 236-22.74-6.74 gram solution Take by mouth as directed per [facility] GI prep instructions, for split bowel prep. 4000 mL 0    sertraline  (ZOLOFT ) 25 MG tablet Take 1 tablet (25 mg total) by mouth daily. 90 tablet 3    spironolactone  (ALDACTONE ) 25 MG tablet Take 1 tablet (25 mg total) by mouth daily. 90 tablet 2    ULTRA-LIGHT ROLLATOR  Misc Use as directed.  0   [4]   Allergies  Allergen Reactions    Fentanyl Anaphylaxis    Codeine Other (See Comments)     Headache, nausea    Corticosteroids (Glucocorticoids) Headache     **ALL STEROIDS**    Gabapentin Other (See Comments)     oversedation    Prednisone  Headache    Pregabalin Rash     Swelling and general fatigue   [5]   Family History  Problem Relation Age of Onset    Hypertension Mother     Cancer Brother         bone    Hypertension Brother     Hypertension Brother     Hypertension Brother     Anesthesia problems Neg Hx     Bleeding Disorder Neg Hx     Breast cancer Neg Hx     Ovarian cancer Neg Hx     Colon cancer Neg Hx     Endometrial cancer Neg Hx    [6]   Social History  Tobacco Use    Smoking status: Never    Smokeless tobacco: Never   Vaping Use    Vaping status: Never Used   Substance Use Topics    Alcohol use: No Drug use: No        Herminio Lauraine MATSU, DO  Resident  09/14/23 [phone]

## 2023-09-13 NOTE — Unmapped (Signed)
 Please refill if appropriate.     Most recent clinic visit: 06/18/2023  Next clinic visit: 09/19/2023

## 2023-09-13 NOTE — Unmapped (Signed)
 C/o pain in the right flank starting this afternoon.  Increase in falls over the last week which she has been evaluated for.  Endorses lower abdominal pressure and increased urge to urinate.

## 2023-09-14 DIAGNOSIS — N9489 Other specified conditions associated with female genital organs and menstrual cycle: Principal | ICD-10-CM

## 2023-09-14 DIAGNOSIS — D7581 Myelofibrosis: Principal | ICD-10-CM

## 2023-09-14 LAB — SLIDE REVIEW

## 2023-09-14 LAB — PERIPHERAL BLOOD SMEAR, PATH REVIEW

## 2023-09-14 MED ADMIN — sodium chloride 0.9% (NS) bolus 1,000 mL: 1000 mL | INTRAVENOUS | @ 03:00:00

## 2023-09-14 NOTE — Unmapped (Addendum)
 1st attempt text message sent to patient  Emily Walter         ----- Message from Velia Novak, MD sent at 09/14/2023  9:19 AM EDT -----  Regarding: appt  Is it possible for pt to be seen next week for ED follow up?   Thanks,   Dr Novak

## 2023-09-14 NOTE — Unmapped (Signed)
 Please tell pt that radiology saw some pelvic cysts and recommended an ultrasound. I have placed that order. It's not urgent.   Also, let her know that I am asking her oncology team if she still needs the abdominal ultrasound scheduled for next week-since she just had abd CT  I have ordered transvaginal ultrasound.  Is she okay with that?

## 2023-09-17 NOTE — Unmapped (Signed)
 ED Clinical Pharmacist Practitioner Culture Follow-up Note    September 17, 2023 11:53 AM     I was contacted by the ED resource nurse (Alivia E. Waddell) with a positive culture result on [employer].     She was seen in the [facility] Emergency Department on 09/13/2023 at which time a urine culture was completed which has returned positive for 50,000 to 100,000 CFU/mL streptococcus bovis.    I have reviewed the patient's chart from this ED visit. Emily Walter presented to ED with RLQ pain radiating to her back as well as suprapubic pain. On exam she had no CVA tenderness. A CT Abdomen performed in the ED demonstrated no nephrolithiasis but did demonstrate hyperattenuating material in renal calcyces, collecting systems, and bladder which may represent calcium stones residual material. This CT also demonstrated bilateral adnexal cystic lesions in the pelvis which were indeterminate. Her symptoms improved with hydration and she was discharged home from the ED without antibiotics.    During the patient's ED visit, a urinalysis demonstrated:  Small leukocyte esterase  Negative nitrites  < 1 WBCs  2 squamous epithelia cells  Rare bacteria    The patient confirmed she had no urinary symptoms and therefore was not treated with antibiotics in the ED or at discharge.  Based on chart review, the patient is not pregnant and does not have any upcoming urologic procedures.     Given the lack of any documented urinary symptoms, clinical picture is most consistent with asymptomatic bacteruria and the patient likely does not require antibiotic treatment.     The ED resource nurse will contact the patient or their representative, inform them of this test result and reassess if she has had any urinary symptoms including fever not attributable to an alternative source, dysuria, urinary hesitancy, urgency, frequency or focal suprapubic pain. If she has had any of these symptoms, amoxicillin  will be e-prescribed or called into the patient's pharmacy of choice for treatment of a UTI. Otherwise, return precautions should be reviewed and she should be instructed to follow-up as directed at the time of her ED visit.     Arland Geralene Salle, PharmD, CPP, BCPS, NHDP-BC  ED Clinical Pharmacist Practitioner

## 2023-09-17 NOTE — Unmapped (Signed)
 [facility] ED Post-Discharge Call    Multiple ED visits this month    Contacted patient at [phone] to discuss ED visit. Confirmed patient identity.     We want to ensure you understood your plan of care. Review AVS instructions. Did your discharge instructions answer all of your questions? yes  Have you made a follow-up appointment? yes  Have you filled your prescriptions (if applicable)? N/A  Do you have any questions regarding your prescriptions?N/A  Do you have any other questions? No  Return precautions discussed.

## 2023-09-17 NOTE — Unmapped (Signed)
 Urine Culture  Order: [id number] - Reflex for Order [id number]   Status: Final result    Test Result Released: No    Specimen Information: Clean Catch; Urine   0 Result Notes       View Follow-Up Encounter  Urine Culture, Comprehensive 50,000 to 100,000 CFU/mL Streptococcus bovis group Abnormal         Patient with a positive urine culture. Patient was not started on antibiotics prior to dispo from ED. EMAP messaged with results.

## 2023-09-17 NOTE — Unmapped (Signed)
 I called and spoke with patient regarding urine culture. I asked patient if she was having any urinary symptoms including fever not attributable to an alternative source, dysuria, urinary hesitancy, urgency, frequency or focal suprapubic pain. I explained patient should continue with her follow-up with her provider as recommended at the time of her discharge. Patient with appointments on 09/19/2023. Patient with no further questions. Return precautions reviewed.

## 2023-09-17 NOTE — Unmapped (Signed)
 Patient agreed to vaginal ultrasound and is scheduled for 10/04/2023.

## 2023-09-17 NOTE — Unmapped (Signed)
 Patient declined appt

## 2023-09-19 ENCOUNTER — Ambulatory Visit
Admit: 2023-09-19 | Discharge: 2023-09-19 | Payer: Medicare (Managed Care) | Attending: Adult Health | Primary: Adult Health

## 2023-09-19 ENCOUNTER — Ambulatory Visit: Admit: 2023-09-19 | Discharge: 2023-09-19 | Payer: Medicare (Managed Care)

## 2023-09-19 ENCOUNTER — Inpatient Hospital Stay: Admit: 2023-09-19 | Discharge: 2023-09-19 | Payer: Medicare (Managed Care)

## 2023-09-19 DIAGNOSIS — D7581 Myelofibrosis: Principal | ICD-10-CM

## 2023-09-19 NOTE — Unmapped (Signed)
 External Referral Request     Requestor:     Confirmation of Order Placed: Yes    Confirmation that Patient is Aware: Yes    Priority Request: Routine (within 72 hours)     Preferred Facility and Location: [facility] / [place], [place]    Preferred Timing Until Referral/Imaging/Testing Appointment: Appointment needed within next 7 business days     Appointment Request: Referral to be arranged along with first appointment, patient to be notified of referral location and information    Thanks,  Delon MARLA Bring, RN

## 2023-09-19 NOTE — Unmapped (Signed)
 [facility]el[facility]oplasms [facility] Follow up Visit    Patient: Emily Walter  MRN: [id number]  DOB: 08-15-1950  Date of Visit: 09/19/2023        Reason for Visit   Emily Walter is a 73 y.o. with HFpEF, OSA, DM2, hypertension, CKD 3 (eGFR 41ml/min), staghorn calculi, chronic back pain, and triple negative primary myelofibrosis (higher risk) diagnosed 07/2016 who presents today in early follow up for progressive cytopenias prompting a bone marrow biopsy on 03/03/23.  Momelotinib 100mg  daily since 03/11/23.      Interval History     History of Present Illness  Emily Walter is a 73 year old female with myelofibrosis who presents for follow-up.    Since her last clinic visit in June, she has had two ER visits. The first was after a fall, resulting in no injuries, and the second was for right lower quadrant and epigastric abdominal pain. A CT scan during the second visit showed mild mesenteric panniculitis, a hernia, and ovarian cysts.    She has experienced multiple falls in the past three weeks, with significant bruising that has not fully resolved. The bruises were initially very dark but have started to fade, although 'knots' remain underneath. She experiences persistent dizziness, especially when bending over, which has led to falls. She uses a cane for balance.    She reports significant fatigue and weight loss despite eating well. She has a history of low platelet counts, which limits treatment options. She has also experienced hearing loss in her left ear, which coincided with the onset of dizziness. Previous treatments, including ear injections, have not alleviated the dizziness.        Review of Systems   10-systems otherwise reviewed and negative except as per Interval History.    Assessment   #1 Primary myelofibrosis, intermediate-2 by DIPSS-Plus, High-risk by MIPSS70-Plus - with dysplastic progression 02/2023  #2 Thrombocytopenia - severe, plts 18 -  thought secondary to #1  #3 Anemia - Multifactorial, secondary to iron  deficiency, #1, CKD.  Improving 2/2 momelotinib 11.2 g/dL.   #4 Splenomegaly - spleen 19.1cm on CT A/P from 01/14/23; was 15.6cm on  05/2022 and normal in 2020  Primary myelofibrosis with associated anemia, thrombocytopenia, splenomegaly, weight loss, and fatigue    She has primary myelofibrosis with anemia, thrombocytopenia, splenomegaly, weight loss, and fatigue. Currently taking momelotinib 100 mg daily. The spleen is enlarged at 17 cm on ultrasound, though CT shows 21 cm, suggesting ultrasound inaccuracy. Low platelets prevent increasing momelotinib dose. Fatigue and weight loss are likely due to myelofibrosis. No disease progression is evident. Bruising and bleeding are concerns due to low platelets. Continue momelotinib 100 mg daily. Discuss bruising management with Dr. Jerrye. Establish care with a local oncologist at [facility] for transfusions. Plan weekly platelet checks and transfusions as needed to maintain platelet count above 20. Schedule follow-up in 2 months.    #5 Chronic lumbar back pain - managed by PCP  #6 Lucency of right shoulder on right shoulder X-ray from 08/10/2021- SPEP/IFE obtained 03/12/23. Faint band suspicious for monoclonal component typed as IgM Lambda. Concentration of possible monoclonal protein cannot be accurately quantified due to the polyclonal background.   #7 HFpEF - no LE edema, stable symptoms - established with [facility]  #8 Staghorn renal calculi - follows with [facility]  #9 CKD3 - eGFR 91ml/min - follows with PCP  #10 Dyspnea and throat pain on exertion - pt had nuclear stress test 03/08/23 - results pending -  followed by PCP  #11 Depression - on sertraline  25mg  po daily - managed by PCP  #12 Iron  deficiency- ferritin 140, iron  35, iron  sat 14% on 12/18/2022.  Received Infed  1g on 07/14/22, 03/30/23.  Iron  infusion improved hemoglobin levels. Continued monitoring of blood counts is necessary to assess treatment response.  - Monitor hemoglobin levels with monthly lab work.    #13 Recurrent falls with contusions and soft tissue hematomas    She experiences recurrent falls with contusions and soft tissue hematomas, particularly on the back and hips, exacerbated by low platelet count. Dizziness contributes to falls. An ENT appointment is scheduled for dizziness evaluation. Continue using a cane for stability. Attend ENT appointment.    Wt Readings from Last 12 Encounters:   09/19/23 97.1 kg (214 lb 1.1 oz)   09/11/23 98.9 kg (218 lb)   08/30/23 98.3 kg (216 lb 12.8 oz)   08/21/23 99.1 kg (218 lb 7.6 oz)   08/09/23 98.8 kg (217 lb 12.8 oz)   07/27/23 98.9 kg (218 lb)   06/18/23 (!) 101.5 kg (223 lb 12.3 oz)   03/30/23 (!) 103 kg (227 lb)   03/19/23 (!) 103.4 kg (228 lb)   03/12/23 (!) 103.7 kg (228 lb 9.9 oz)   02/15/23 (!) 105.7 kg (233 lb)   01/24/23 (!) 106.5 kg (234 lb 12.6 oz)          Plan   Continue momelotinib 100mg  po daily  Dose reduced by 50% due to plts 18, baseline CKD (eGFR 21ml/min). Plan to increase if tolerates.   Indication = PMF with symptomatic splenomegaly + anemia  Monitoring = Local labs at [facility].  CBC-D, CMP - standing orders   Avoid aspirin/NSAIDs due to severe thrombocytopenia  Establish local heme onc provider for labs and platelet transfusions  Follow up in 2 months    Rhoda Adjutant, RN, MSN, AGPCNP-C  Nurse Practitioner  Hematologic Malignancies  [facility]  09/19/2023      I personally spent 40 minutes face-to-face and non-face-to-face in the care of this patient, which includes all pre, intra, and post visit time on the date of service. This time was spent in reviewing notes, labs and other test results in the chart, speaking with and examining the patient, communicating with other medical teams and documentation of the clinical encounter. All documented time was specific to the E/M visit and does not include any procedures that may have been performed.        History of Present Illness     Hematology/Oncology History   MDS/MPN (myelodysplastic/myeloproliferative neoplasms)       08/03/2016 Initial Diagnosis    PRIMARY MYELOFIBROSIS vs MDS/MPN with fibrosis    Prior labs show:  2014 at [facility]: normal CBC, WBC 6.5, Hgb 15.2, Plts 181.    CBC from 08/03/2016: WBC 10.3 x 10^9/L, Hgb 12.3 g/dL, MCV 85, plt 52 x 89^0/O.    Due to the cytopenias, Ms. Nazario was sent to Dr. India Barge and underwent a bone marrow biopsy. I currently do not have bone marrow slides to review.  Outside [facility] report from 08/03/2016 states,  Hypercellular bone marrow (85%) with hyperplastic hematopoiesis.  Atypical megakaryocyte hyperplasia with focal clustering.  Abnormal localization of immature myeloid precursors.  No significant increase in blasts.  Mild to moderate reticulin fibrosis.    Driver mutation = SRSF2 (2018)  Karyotype = 46,XX,add(4)(q33),add(17)(q21)[13]     Higher-risk disease, with thrombocytopenia, SRSF2 mutation.      She was  not an optimal alloHSCT candidate due to multiple co-morbidities.      She trialed ruxolitinib  but did not have a good response.     07/14/2022:  Received Infed  1g IV for iron  deficiency.     02/21/2023 Progression    MYELODYSPLASTIC PROGRESSION     She has been on observation alone for several years.  She now has progressive cytopenias, with platelets trending down to teens from 40-50's in April 2024 and hgb trending down from 11-12 g/dL in Feb/March 2024 to 9.9 g/dL now.  WBC has progressively increased, was 10 in 05/2022 and now consistently >15.  Myeloid mutation panel with persistent SRSF2 mutation and new ASXL1 mutation with VAF 5%. In this context, a bone marrow biopsy was performed at [facility] on 02/21/23.  This showed persistent myelofibrosis with dysplastic features, consistent with MDS/MPN or perhaps MDS with myelofibrosis.      Karyotype is pending, but prior karyotype 46,XX with add(4) and add(17).       The bone marrow now appears most consistent with MDS.    For prognostic calculations, I used labs from 02/21/23: ANC 15, hgb 9.9, plts 18, bm blasts 2%, age 16yo, intermediate karyotype, SRSF2, ASXL1.      IPSS:  Int-1 (1 pt)  IPSS-R: Intermediate. (4pt) 66yr median overall survival.  25% AML transformation 3.2 yrs.   IPSS-M: Moderate-High. Leukemia-free survival 2.3 yrs. Overall survival 2.8 yrs.      03/11/2023 -  Chemotherapy    MOMELOTINIB    Indication = primary myelofibrosis with anemia   Starting dose = 100mg  po daily (reduced for platelet count 18)    03/12/23: WBC 24.7, Hgb 10, Plts 18. sCr 1.34.   04/09/23: WBC 26.6, Hgb 10.6, Plts 17.  sCr 1.34. 4% myelocytes, 4% metamyelocytes.   05/10/23: WBC 21.8, hgb 11.6, plt 18  06/11/23: Patient stopped momelotinib without team's knowledge due to what she considered new side effects  06/18/23: WBC 24.4, hgb 10.4, plt 13, sCr 1.46             Medications     Current Outpatient Medications   Medication Sig Dispense Refill    albuterol  HFA 90 mcg/actuation inhaler Inhale 2 puffs every six (6) hours as needed for wheezing or shortness of breath. 8 g 3    cholecalciferol , vitamin D3-50 mcg, 2,000 unit,, 50 mcg (2,000 unit) tablet Take 1 tablet (50 mcg total) by mouth in the morning.      conjugated estrogens  (PREMARIN ) 0.625 mg/gram vaginal cream Insert 0.5 g into the vagina Two (2) times a week. 30 g 1    diclofenac  sodium (VOLTAREN ) 1 % gel Apply 4 g topically four (4) times a day as needed for arthritis or pain (Knee pain). 100 g 2    fluticasone propionate (FLONASE) 50 mcg/actuation nasal spray 2 sprays into each nostril daily as needed for allergies.      furosemide  (LASIX ) 40 MG tablet Take 1 tablet (40 mg total) by mouth daily. 90 tablet 3    levocetirizine (XYZAL) 5 MG tablet Take 1 tablet (5 mg total) by mouth every evening. 90 tablet 3    lidocaine (LIDODERM) 5 % patch Place 1 patch on the skin daily. Apply to affected area for 12 hours only each day (then remove patch)      lovastatin  (MEVACOR ) 40 MG tablet Take 1 tablet (40 mg total) by mouth daily. 90 tablet 1 miscellaneous medical supply Misc Rollator for daily use with ambulation dx: M48.061  momelotinib (OJJAARA ) 100 mg tablet Take 1 tablet (100 mg total) by mouth daily. Swallow tablets whole; do not cut, crush, or chew. 30 tablet 2    NARCAN 4 mg/actuation nasal spray 1 spray into alternating nostrils once as needed.  0    omeprazole  (PRILOSEC) 40 MG capsule Take 1 capsule (40 mg total) by mouth daily. 90 capsule 3    ondansetron  (ZOFRAN -ODT) 4 MG disintegrating tablet Dissolve 1 tablet (4 mg total) in the mouth every eight (8) hours as needed for nausea. 15 tablet 3    oxyCODONE -acetaminophen (PERCOCET) 10-325 mg per tablet Take 1 tablet by mouth every six (6) hours as needed for pain.      polyethylene glycol (GOLYTELY ) 236-22.74-6.74 gram solution Take by mouth as directed per [facility] GI prep instructions, for split bowel prep. 4000 mL 0    sertraline  (ZOLOFT ) 25 MG tablet Take 1 tablet (25 mg total) by mouth daily. 90 tablet 3    spironolactone  (ALDACTONE ) 25 MG tablet Take 1 tablet (25 mg total) by mouth daily. 90 tablet 2    ULTRA-LIGHT ROLLATOR Misc Use as directed.  0     No current facility-administered medications for this visit.       Allergies     Allergies   Allergen Reactions    Fentanyl Anaphylaxis    Codeine Other (See Comments)     Headache, nausea    Corticosteroids (Glucocorticoids) Headache     **ALL STEROIDS**    Gabapentin Other (See Comments)     oversedation    Prednisone  Headache    Pregabalin Rash     Swelling and general fatigue       Past Medical and Surgical History     Past Medical History:   Diagnosis Date    Anxiety     Arthritis     Asthma (HHS-HCC)     Cancer         Cataract     Chronic kidney disease     Depression     Difficult intravenous access     GERD (gastroesophageal reflux disease)     Heart failure         HLD (hyperlipidemia)     Hypertension     Myelofibrosis         Nephrolithiasis     Sleep apnea     Tinnitus      Past Surgical History:   Procedure Laterality Date HYSTERECTOMY      PR CYSTO/URETERO W/LITHOTRIPSY &INDWELL STENT INSRT Left 08/21/2018    Procedure: priority CYSTOURETHROSCOPY, WITH URETEROSCOPY AND/OR PYELOSCOPY; WITH LITHOTRIPSY INCLUDING INSERTION OF INDWELLING URETERAL STENT;  Surgeon: Nicholaus Mt Viprakasit, MD;  Location: CYSTO PROCEDURE SUITES [facility];  Service: Urology    PR CYSTO/URETERO/PYELOSCOPY, CALCULUS TX Left 09/04/2018    Procedure: CYSTOURETHROSCOPY, W/URETEROSCOPY &/OR PYELOSCOPY; W/REMOVAL/MANIPULATION CALCULUS(URETERAL CATH INCLUDED);  Surgeon: Nicholaus Mt Viprakasit, MD;  Location: CYSTO PROCEDURE SUITES [facility];  Service: Urology    PR CYSTOSCOPY,INSERT URETERAL STENT Left 05/04/2022    Procedure: CYSTOURETHROSCOPY,  WITH INSERTION OF INDWELLING URETERAL STENT (EG, GIBBONS OR DOUBLE-J TYPE);  Surgeon: Drena Darryle Gull, MD;  Location: MAIN OR [facility];  Service: Urology    PR CYSTOSCOPY,REMV CALCULUS,SIMPLE Left 09/04/2018    Procedure: CYSTOURETHROSCOPY, WITH REMOVAL OF FOREIGN BODY, CALCULUS OR URETERAL STENT FROM URETHRA OR BLADDER; SIMPLE;  Surgeon: Nicholaus Mt Viprakasit, MD;  Location: CYSTO PROCEDURE SUITES [facility];  Service: Urology    PR CYSTOURETHROSCOPY,URETER CATHETER Left 08/21/2018    Procedure: CYSTOURETHROSCOPY, W/URETERAL CATHETERIZATION, W/WO IRRIG,  INSTILL, OR URETEROPYELOG, EXCLUS OF RADIOLG SVC;  Surgeon: Nicholaus Mt Viprakasit, MD;  Location: CYSTO PROCEDURE SUITES [facility];  Service: Urology    PR CYSTOURETHROSCOPY,URETER CATHETER Left 09/04/2018    Procedure: CYSTOURETHROSCOPY, W/URETERAL CATHETERIZATION, W/WO IRRIG, INSTILL, OR URETEROPYELOG, EXCLUS OF RADIOLG SVC;  Surgeon: Nicholaus Mt Viprakasit, MD;  Location: CYSTO PROCEDURE SUITES [facility];  Service: Urology    PR PERQ DILATION XST TRC NEW ACCESS RENAL COLTJ SYS Left 04/24/2022    Procedure: DILATION OF EXISTING TRACT, PERC, FOR ENDOUROLOGIC PROC INCL IMAGING GUIDANCE & RADIOLOGIC SUPERVISION & INTERPRET, W POSTPROC TUBE PLACEMENT, WHEN PERF; INCL NEW ACCESS INTO RENAL COLLECTING SYSTEM; Surgeon: Mitch Alm Pickles, MD;  [facility]: [facility];  Service: Urology    PR PERQ NL/PL LITHOTRP COMPLEX >2 CM MLT LOCATIONS Left 04/24/2022    Procedure: standard PCNL prone vs. standard; need fortec;  Surgeon: Mitch Alm Pickles, MD;  Location: [facility] OR [facility];  Service: Urology    PR REMOVE BLADDER STONE,<2.5 CM N/A 08/21/2018    Procedure: LITHOLAPAXY: CRUSH/FRAGMENT CALCULUS, BY ANY MEANS IN BLADDER & REMOVAL OF FRAGMENTS; SIMPLE/SMALL <2.5 CM;  Surgeon: Nicholaus Mt Viprakasit, MD;  Location: CYSTO PROCEDURE SUITES [facility];  Service: Urology    SALPINGOOPHORECTOMY Right     12/07/2010    VAGINAL HYSTERECTOMY      12/07/2010       Social History     Social History     Socioeconomic History    Marital status: Married   Tobacco Use    Smoking status: Never    Smokeless tobacco: Never   Vaping Use    Vaping status: Never Used   Substance and Sexual Activity    Alcohol use: No    Drug use: No    Sexual activity: Not Currently     Social Drivers of Health     Financial Resource Strain: Low Risk  (04/25/2022)    Overall Financial Resource Strain (CARDIA)     Difficulty of Paying Living Expenses: Not hard at all   Food Insecurity: No Food Insecurity (05/17/2023)    Hunger Vital Sign     Worried About Running Out of Food in the Last Year: Never true     Ran Out of Food in the Last Year: Never true   Transportation Needs: No Transportation Needs (05/17/2023)    PRAPARE - Transportation     Lack of Transportation (Medical): No     Lack of Transportation (Non-Medical): No   Stress: No Stress Concern Present (08/09/2023)    [employer] of Occupational Health - Occupational Stress Questionnaire     Feeling of Stress: Not at all   Housing: Low Risk  (05/17/2023)    Housing     Within the past 12 months, have you ever stayed: outside, in a car, in a tent, in an overnight shelter, or temporarily in someone else's home (i.e. 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  Exposed to 2nd hand smoke; she is a lifelong non-smoker.  Went on disability in 10-18-04 d/t back and has worsened this year.      Enjoys traveling.  Married - husband is widowed.      Family History   Two sons - the youngest died in 10-19-11 at the age of 17 from bladder cancer.     Physical Examination     There were no vitals filed for this visit.  GENERAL: Appears tired but in no acute distress.  Accompanied by her husband.  HEENT: No scleral icterus.  No periorbital purpura.  RESP: CTAB.    CV: RRR.  No murmurs rubs or gallops appreciated.  ABD: BS present, splenomegaly to about 14cm, limited by body habitus.  NEURO: A&Ox4, CN observed are WNL  PSYCH: Normal affect, affect is full and congruent.  No psychomotor agitation nor retardation.  Thought process is goal-directed and linear.      Laboratory Testing and Imaging     Results for orders placed or performed during the hospital encounter of 09/13/23   Urine Culture    Specimen: Clean Catch; Urine   Result Value Ref Range    Urine Culture, Comprehensive 50,000 to 100,000 CFU/mL Streptococcus bovis group (A)    Comprehensive Metabolic Panel   Result Value Ref Range    Sodium 146 (H) 135 - 145 mmol/L    Potassium 4.2 3.4 - 4.8 mmol/L    Chloride 99 98 - 107 mmol/L    CO2 32.2 (H) 20.0 - 31.0 mmol/L    Anion Gap 15 (H) 5 - 14 mmol/L    BUN 22 9 - 23 mg/dL    Creatinine 8.18 (H) 0.55 - 1.02 mg/dL    BUN/Creatinine Ratio 12     eGFR CKD-EPI (2021) Female 29 (L) >=60 mL/min/1.48m2    Glucose 112 70 - 179 mg/dL    Calcium 9.3 8.7 - 89.5 mg/dL    Albumin 3.8 3.4 - 5.0 g/dL    Total Protein 7.8 5.7 - 8.2 g/dL    Total Bilirubin 1.2 0.3 - 1.2 mg/dL    AST 18 <=65 U/L    ALT 7 (L) 10 - 49 U/L    Alkaline Phosphatase 94 46 - 116 U/L   PT-INR   Result Value Ref Range    PT 14.1 (H) 9.9 - 12.6 sec    INR 1.24    PTT   Result Value Ref Range    APTT 30.4 24.8 - 38.4 sec    Heparin Correlation 0.2    Pathologist Smear Review Result Value Ref Range    Pathologist Smear Interpretation Confirmed by Hemepath Specialist/Senior Tech Confirmed by Hemepath Fellow, Confirmed by Hemepath Specialist/Senior Tech, Confirmed by Core Specialist/Senior Tech, To Be Accessioned - Case Report to Follow, Physician Requested Path Review - BF/CSF: cytospin routed to hemepath, Physician Requested...   Type and Screen with Confirmation ABORh   Result Value Ref Range    Blood Type B POS     Antibody Screen NEG    Urinalysis with Microscopy with Culture Reflex   Result Value Ref Range    Color, UA Yellow     Clarity, UA Turbid     Specific Gravity, UA 1.014 1.003 - 1.030    pH, UA 5.5 5.0 - 9.0    Leukocyte Esterase, UA Small (A) Negative    Nitrite, UA Negative Negative    Protein, UA Negative Negative    Glucose, UA Negative Negative    Ketones, UA Negative Negative    Urobilinogen, UA <2.0 mg/dL <7.9 mg/dL    Bilirubin, UA Negative Negative    Blood, UA Moderate (A) Negative    RBC, UA 63 (H) <=4 /HPF    WBC, UA <1 0 - 5 /HPF    Squam Epithel, UA 2 0 - 5 /HPF    Bacteria, UA Rare (A) None Seen /HPF    Mucus, UA Rare (A) None Seen /HPF   CBC  w/ Differential   Result Value Ref Range    WBC 24.8 (H) 3.6 - 11.2 10*9/L    RBC 4.09 3.95 - 5.13 10*12/L    HGB 11.1 (L) 11.3 - 14.9 g/dL    HCT 65.6 65.9 - 55.9 %    MCV 84.0 77.6 - 95.7 fL    MCH 27.2 25.9 - 32.4 pg    MCHC 32.3 32.0 - 36.0 g/dL    RDW 79.3 (H) 87.7 - 15.2 %    MPV 9.4 6.8 - 10.7 fL    Platelet 19 (L) 150 - 450 10*9/L    nRBC 3 <=4 /100 WBCs    Neutrophils % 81.8 %    Lymphocytes % 6.5 %    Monocytes % 2.7 %    Eosinophils % 6.0 %    Basophils % 3.0 %    Absolute Neutrophils 20.3 (H) 1.8 - 7.8 10*9/L    Absolute Lymphocytes 1.6 1.1 - 3.6 10*9/L    Absolute Monocytes 0.7 0.3 - 0.8 10*9/L    Absolute Eosinophils 1.5 (H) 0.0 - 0.5 10*9/L    Absolute Basophils 0.7 (H) 0.0 - 0.1 10*9/L    Anisocytosis Marked (A) Not Present   Morphology Review   Result Value Ref Range    Smear Review Comments See Comment Undefined    Polychromasia Moderate (A) Not Present    Basophilic Stippling Present (A) Not Present    Neutrophil Left Shift Present (A) Not Present     *Note: Due to a large number of results and/or encounters for the requested time period, some results have not been displayed. A complete set of results can be found in Results Review.

## 2023-09-24 NOTE — Unmapped (Signed)
 [facility] On Call Intervention    Ms.Brooker paged the On Call Specialty Pharmacist on 09/21/23 at 6:41pm regarding their medication Hematology/Oncology: Ojjaara .     Type of intervention: Missing delivery    Action taken:    Follow-up needed:    Time spent: 20 minutes      This encounter has been routed to the patient's primary specialty pharmacist.      KATE MUNSTER, [facility]  [facility] Pharmacist

## 2023-09-24 NOTE — Unmapped (Signed)
 [facility] - Clinical Pharmacist Intervention   Reason for Intervention Issue Observed            Additional Comments lost delivery   Intervention Type of Intervention Medication delivery scheduled                  Recommendation provided, if applicable      Medication Medication(s) Involved Ojjaara  should have been delivered on 09/21/23 but was not.  Patient called in after hrs and upon investigation per pt the address is incorrect.  Monday 09/24/23 have attempted to reroute the package to the address pt reported on 09/24/23.Pt still has 14 days of medication on hand.    Added notes in W[facility] and hyperspace to please verify address   Outcome Expected Result/Outcome of Intervention Improved therapy effectiveness    Additional Comments      Follow-up Needed? Yes    Additional Follow-up Comments     Supporting Information Approximate Time Spent on Intervention      Clinical Evidence Used Professional judgement

## 2023-09-26 DIAGNOSIS — D7581 Myelofibrosis: Principal | ICD-10-CM

## 2023-09-28 DIAGNOSIS — D7581 Myelofibrosis: Principal | ICD-10-CM

## 2023-10-04 ENCOUNTER — Inpatient Hospital Stay: Admit: 2023-10-04 | Discharge: 2023-10-04 | Payer: Medicare (Managed Care)

## 2023-10-05 ENCOUNTER — Ambulatory Visit: Admit: 2023-10-05 | Discharge: 2023-10-06 | Payer: Medicare (Managed Care)

## 2023-10-05 DIAGNOSIS — D7581 Myelofibrosis: Principal | ICD-10-CM

## 2023-10-05 LAB — CBC W/ AUTO DIFF
BASOPHILS ABSOLUTE COUNT: 0.7 10*9/L — ABNORMAL HIGH (ref 0.0–0.4)
BASOPHILS RELATIVE PERCENT: 2.4 %
EOSINOPHILS ABSOLUTE COUNT: 1.7 10*9/L — ABNORMAL HIGH (ref 0.0–0.7)
EOSINOPHILS RELATIVE PERCENT: 5.9 %
HEMATOCRIT: 35.8 % — ABNORMAL LOW (ref 37.0–47.0)
HEMOGLOBIN: 10.3 g/dL — ABNORMAL LOW (ref 12.0–16.0)
IMMATURE GRANULOCYTES ABSOLUTE COUNT: 7.2 10*9/L — ABNORMAL HIGH (ref 0.0–0.6)
IMMATURE GRANULOCYTES RELATIVE PERCENT: 25.7 %
IMMATURE PLATELET FRACTION: 20.3 % — ABNORMAL HIGH (ref 1.0–7.0)
LYMPHOCYTES ABSOLUTE COUNT: 2.7 10*9/L (ref 1.2–3.4)
LYMPHOCYTES RELATIVE PERCENT: 9.6 %
MEAN CORPUSCULAR HEMOGLOBIN CONC: 28.8 g/dL — ABNORMAL LOW (ref 31.0–35.5)
MEAN CORPUSCULAR HEMOGLOBIN: 26.5 pg (ref 26.0–34.0)
MEAN CORPUSCULAR VOLUME: 92.3 fL (ref 81.0–102.0)
MONOCYTES ABSOLUTE COUNT: 1.1 10*9/L — ABNORMAL HIGH (ref 0.1–0.5)
MONOCYTES RELATIVE PERCENT: 3.8 %
NEUTROPHILS ABSOLUTE COUNT: 14.7 10*9/L — ABNORMAL HIGH (ref 1.4–6.5)
NEUTROPHILS RELATIVE PERCENT: 52.6 %
NUCLEATED RED BLOOD CELLS: 5 /100{WBCs} — ABNORMAL HIGH (ref 0–2)
PLATELET COUNT: 15 10*9/L — CL (ref 130–400)
RED BLOOD CELL COUNT: 3.88 10*12/L — ABNORMAL LOW (ref 4.20–5.40)
RED CELL DISTRIBUTION WIDTH: 20.3 % — ABNORMAL HIGH (ref 11.5–15.5)
WBC ADJUSTED: 27.9 10*9/L — ABNORMAL HIGH (ref 4.8–10.8)

## 2023-10-05 LAB — COMPREHENSIVE METABOLIC PANEL
ALBUMIN/GLOBULIN RATIO: 1.3
ALBUMIN: 3.9 g/dL (ref 3.1–4.7)
ALKALINE PHOSPHATASE: 73 U/L (ref 33–120)
ALT (SGPT): 7 U/L (ref 3–33)
ANION GAP: 4 mmol/L — ABNORMAL LOW (ref 5–16)
AST (SGOT): 15 U/L (ref 10–40)
BILIRUBIN TOTAL: 0.9 mg/dL (ref 0.2–1.2)
BLOOD UREA NITROGEN: 19 mg/dL (ref 8–21)
BUN / CREAT RATIO: 15 (ref 6–20)
CALCIUM: 8.8 mg/dL (ref 8.5–11.0)
CHLORIDE: 102 mmol/L (ref 98–110)
CO2: 36 mmol/L — ABNORMAL HIGH (ref 24.0–31.0)
CREATININE: 1.3 mg/dL (ref 0.50–1.40)
EGFR CKD-EPI (2021) FEMALE: 43 mL/min/1.73m2 — ABNORMAL LOW (ref >=60)
GLOBULIN, TOTAL: 3 g/dL
GLUCOSE RANDOM: 105 mg/dL (ref 75–110)
OSMOLALITY CALCULATED: 286 mosm/kg
POTASSIUM: 4.6 mmol/L (ref 3.5–5.3)
PROTEIN TOTAL: 6.9 g/dL (ref 6.0–8.0)
SODIUM: 142 mmol/L (ref 135–153)

## 2023-10-05 LAB — MANUAL DIFFERENTIAL
BANDS - REL (DIFF): 5 %
BASOPHILS - ABS (DIFF): 0.6 10*9/L — ABNORMAL HIGH (ref 0.0–0.4)
BASOPHILS - REL (DIFF): 2 %
EOSINOPHILS - ABS (DIFF): 2.5 10*9/L — ABNORMAL HIGH (ref 0.0–0.7)
EOSINOPHILS - REL (DIFF): 9 %
LYMPHOCYTES - ABS (DIFF): 3.3 10*9/L (ref 1.2–3.4)
LYMPHOCYTES - REL (DIFF): 12 %
METAMYELOCYTES-REL (DIFF): 8 %
MONOCYTES - ABS (DIFF): 1.7 10*9/L — ABNORMAL HIGH (ref 0.1–0.5)
MONOCYTES - REL (DIFF): 6 %
MYELOCYTES - REL (DIFF): 4 %
NEUTROPHILS - ABS (DIFF): 16.5 10*9/L — ABNORMAL HIGH (ref 1.4–6.5)
NEUTROPHILS - REL (DIFF): 54 %

## 2023-10-05 LAB — LACTATE DEHYDROGENASE: LACTATE DEHYDROGENASE: 621 U/L — ABNORMAL HIGH (ref 100–220)

## 2023-10-05 NOTE — Unmapped (Deleted)
 [facility]  Hematology Oncology Consultation    DATE OF SERVICE  10/05/2023     REFERRING PROVIDER   Luanna Asberry Lolling, Agnp  [address]  [id number]  [place],  [place] [address]    PRIMARY CARE PROVIDER  Marnie Emmie Dais, MD  [address] 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    CBC from 08/03/2016: WBC 10.3 x 10^9/L, Hgb 12.3 g/dL, MCV 85, plt 52 x 89^0/O.    Due to the cytopenias, Emily Walter was sent to Dr. India Barge and underwent a bone marrow biopsy. I currently do not have bone marrow slides to review.  Outside [facility] report from 08/03/2016 states,  Hypercellular bone marrow (85%) with hyperplastic hematopoiesis.  Atypical megakaryocyte hyperplasia with focal clustering.  Abnormal localization of immature myeloid precursors.  No significant increase in blasts.  Mild to moderate reticulin fibrosis.    Driver mutation = SRSF2 (2018)  Karyotype = 46,XX,add(4)(q33),add(17)(q21)[13]     Higher-risk disease, with thrombocytopenia, SRSF2 mutation.      She was not an optimal alloHSCT candidate due to multiple co-morbidities.      She trialed ruxolitinib  but did not have a good response.     07/14/2022:  Received Infed  1g IV for iron  deficiency.     02/21/2023 Progression    MYELODYSPLASTIC PROGRESSION     She has been on observation alone for several years.  She now has progressive cytopenias, with platelets trending down to teens from 40-50's in April 2024 and hgb trending down from 11-12 g/dL in Feb/March 2024 to 9.9 g/dL now.  WBC has progressively increased, was 10 in 05/2022 and now consistently >15.  Myeloid mutation panel with persistent SRSF2 mutation and new ASXL1 mutation with VAF 5%. In this context, a bone marrow biopsy was performed at [facility] on 02/21/23.  This showed persistent myelofibrosis with dysplastic features, consistent with MDS/MPN or perhaps MDS with myelofibrosis.      Karyotype is pending, but prior karyotype 46,XX with add(4) and add(17).       The bone marrow now appears most consistent with MDS.    For prognostic calculations, I used labs from 02/21/23: ANC 15, hgb 9.9, plts 18, bm blasts 2%, age 73yo, intermediate karyotype, SRSF2, ASXL1.      IPSS:  Int-1 (1 pt)  IPSS-R: Intermediate. (4pt) 50yr median overall survival.  25% AML transformation 3.2 yrs.   IPSS-M: Moderate-High. Leukemia-free survival 2.3 yrs. Overall survival 2.8 yrs.      03/11/2023 -  Chemotherapy    MOMELOTINIB    Indication = primary myelofibrosis with anemia   Starting dose = 100mg  po daily (reduced for platelet count 18)    03/12/23: WBC 24.7, Hgb 10, Plts 18. sCr 1.34.   04/09/23: WBC 26.6, Hgb 10.6, Plts 17.  sCr 1.34. 4% myelocytes, 4% metamyelocytes.   05/10/23: WBC 21.8, hgb 11.6, plt 18  06/11/23: Patient stopped momelotinib without team's knowledge due to what she considered new side effects  06/18/23: WBC 24.4, hgb 10.4, plt 13, sCr 1.46  CANCER STAGING  Cancer Staging   No matching staging information was found for the patient.      SUMMARY/ASSESSMENT  No problem-specific Assessment & Plan notes found for this encounter.      RECOMMENDATION/PLAN  ***    HISTORY OF PRESENT ILLNESS   Emily Walter is a 73 y.o. female with past medical history significant for myelofibrosis, HFpEF, severe OSA, CKD stage 3b, hyperlipidemia, GERD, MDD, RLS, OA, multiple falls and prediabetes who presents for follow-up of myelofibrosis with progressive cytopenias.  Patient was initially diagnosed with myelofibrosis back in 2018 when bone marrow biopsy showed hypercellular bone marrow with hyperplastic hematopoiesis.  She was initially on rixulitinib but was noted to not have good response.  Patient was noted to have continued cytopenias with platelets in 40s to 50s which prompted another bone marrow biopsy and 01/25.  Patient was then started on momelotinib.  Patient follows with [facility] hematology and oncology in [facility] and was recently seen on 09/19/2023 when she was noted to have significant thrombocytopenia with platelet count of 19 and dose of momelotinib was reduced to 50% with plans to increase the dose if tolerated.  Patient was given referral to [facility] cancer to establish care with local heme-onc for monitoring labs and platelet transfusions.           PAST MEDICAL HISTORY  Past Medical History[1]    SURGICAL HISTORY  Past Surgical History[2]    GYNECOLOGIC HISTORY  Menstrual History:  OB History       Gravida   2    Para   2    Term   2    Preterm        AB        Living             SAB        IAB        Ectopic        Molar        Multiple        Live Births                     No LMP recorded. Patient is postmenopausal.           ALLERGIES:  Allergies[3]    MEDICATIONS:  Current Medications[4]     FAMILY HISTORY  Family History[5]    SOCIAL HISTORY  Social History[6]     REVIEW OF SYSTEMS  Constitutional: No fever, sweats, or shaking chills. No appetite or weight change.  ECOG Status is .   HEENT: No visual changes or hearing deficit. No changes in voice.  No mouth sores.     Pulmonary: No unusual cough, sore throat, or orthopnea.   Breasts: No masses, skin changes, nipple inversion or discharge.    Cardiovascular: No coronary artery disease, angina, or myocardial infarction. No palpitations.     Gastrointestinal: No nausea, vomiting, dysphagia, odynophagia, abdominal pain, diarrhea, or constipation. No change in bowel habits.   Genitourinary: No frequency, urgency, hematuria, or dysuria.   Musculoskeletal: No arthralgias or myalgias; no back pain;  no joint swelling, pain or instability.   Hematologic: No bleeding tendency or easy bruisability. Endocrine: No intolerance to heat or cold; no thyroid disease or diabetes mellitus.   Skin: No rash, scaling, sores, lumps, or jaundice.  Vascular: No peripheral arterial or venous thromboembolic disease.   Psychological: No anxiety, depression, or mood changes; no mental health illnesses.   Neurological: No  dizziness, lightheadedness, syncope, or near syncopal episodes; no numbness or tingling in the fingers or toes.     PHYSICAL EXAMINATION  There were no vitals taken for this visit.     General:   Comfortable.  Here with ***   Eyes:   Pupil equal round reacting to light and accomodation.  Extra occular muscles intact, and sclera clear and without icteris.   ENT:   Oropharynx without mucositis, or thrush.     Neck:   Supple without any enlargement, no thyromegaly, bruit, or jugular venous distention.   Lymph Nodes:  No adenopathy (cervical, supraclavicular, axillary, inguinal)   Cardiovascular:  RRR, normal S1, S2 without murmur, rub, or gallop.  Pulses 2+ equal on both sides without any bruits.   Lungs:  Clear to auscultation bilaterally, without wheezes/crackles/rhonchi.  Good air movement.   Skin:    No rash.lesions/breakdown   Breast:    No nodules, masses or discharge   Psychiatry:   Alert and oriented to person, place, and time    Abdomen:   Normoactive bowel sounds, abdomen soft, non-tender and not distended, no Hepatosplenomegaly or masses.  Liver normal in size, no rebound or guarding.    Genito Urinary:   Genitalia within normal limits, no discharge or lesions.   Rectal:    Normal tone, no blood in the stool, no masses.   Extremities:   No bilateral cyanosis, clubbing or edema.  No rash, lesions, or petechiae.   Musculo Skeletal:   No joint tenderness, deformity, effusions.  No spine or costovertebral angle tenderness.  Full range of motion in shoulder, elbow, hip, knee, ankle, hands and feet.   Neurological:  Alert and oriented to person, place and time.  Cranial nerves II-XII grossly intact, normal gait, normal sensation throughout, normal cerebellar function.           LABORATORY STUDIES  Admission on 09/13/2023, Discharged on 09/14/2023   Component Date Value Ref Range Status    Color, UA 09/13/2023 Yellow   Final    Clarity, UA 09/13/2023 Turbid   Final    Specific Gravity, UA 09/13/2023 1.014  1.003 - 1.030 Final    pH, UA 09/13/2023 5.5  5.0 - 9.0 Final    Leukocyte Esterase, UA 09/13/2023 Small (A)  Negative Final    Nitrite, UA 09/13/2023 Negative  Negative Final    Protein, UA 09/13/2023 Negative  Negative Final    Glucose, UA 09/13/2023 Negative  Negative Final    Ketones, UA 09/13/2023 Negative  Negative Final    Urobilinogen, UA 09/13/2023 <2.0 mg/dL  <7.9 mg/dL Final    Bilirubin, UA 09/13/2023 Negative  Negative Final    Blood, UA 09/13/2023 Moderate (A)  Negative Final    RBC, UA 09/13/2023 63 (H)  <=4 /HPF Final    WBC, UA 09/13/2023 <1  0 - 5 /HPF Final    Squam Epithel, UA 09/13/2023 2  0 - 5 /HPF Final    Bacteria, UA 09/13/2023 Rare (A)  None Seen /HPF Final    Mucus, UA 09/13/2023 Rare (A)  None Seen /HPF Final    Sodium 09/13/2023 146 (H)  135 - 145 mmol/L Final    Potassium 09/13/2023 4.2  3.4 - 4.8 mmol/L Final    Chloride 09/13/2023 99  98 - 107 mmol/L Final    CO2 09/13/2023 32.2 (H)  20.0 - 31.0 mmol/L Final    Anion Gap 09/13/2023 15 (H)  5 - 14 mmol/L Final    BUN 09/13/2023 22  9 - 23 mg/dL Final    Creatinine 91/92/7974 1.81 (H)  0.55 - 1.02 mg/dL Final    BUN/Creatinine Ratio 09/13/2023 12   Final    eGFR CKD-EPI (2021) Female 09/13/2023 29 (L)  >=60 mL/min/1.65m2 Final    eGFR calculated with CKD-EPI 2021 equation in accordance with [employer] and [employer] of Nephrology Task Force recommendations.    Glucose 09/13/2023 112  70 - 179 mg/dL Final    Calcium 91/92/7974 9.3  8.7 - 10.4 mg/dL Final    Albumin 91/92/7974 3.8  3.4 - 5.0 g/dL Final    Total Protein 09/13/2023 7.8  5.7 - 8.2 g/dL Final    Total Bilirubin 09/13/2023 1.2  0.3 - 1.2 mg/dL Final AST 91/92/7974 18  <=34 U/L Final    ALT 09/13/2023 7 (L)  10 - 49 U/L Final    Alkaline Phosphatase 09/13/2023 94  46 - 116 U/L Final    PT 09/13/2023 14.1 (H)  9.9 - 12.6 sec Final    INR 09/13/2023 1.24   Final    APTT 09/13/2023 30.4  24.8 - 38.4 sec Final    Heparin Correlation 09/13/2023 0.2   Final    NOTE: This result is for use only with patients receiving heparin therapy in conjunction with the Heparin Nomogram.  In patients not receiving heparin, an elevated aPTT does not always imply anticoagulation.    Blood Type 09/13/2023 B POS   Final    Antibody Screen 09/13/2023 NEG   Final    WBC 09/13/2023 24.8 (H)  3.6 - 11.2 10*9/L Final    RBC 09/13/2023 4.09  3.95 - 5.13 10*12/L Final    HGB 09/13/2023 11.1 (L)  11.3 - 14.9 g/dL Final    HCT 91/92/7974 34.3  34.0 - 44.0 % Final    MCV 09/13/2023 84.0  77.6 - 95.7 fL Final    MCH 09/13/2023 27.2  25.9 - 32.4 pg Final    MCHC 09/13/2023 32.3  32.0 - 36.0 g/dL Final    RDW 91/92/7974 20.6 (H)  12.2 - 15.2 % Final    MPV 09/13/2023 9.4  6.8 - 10.7 fL Final    Platelet 09/13/2023 19 (L)  150 - 450 10*9/L Final    nRBC 09/13/2023 3  <=4 /100 WBCs Final    Neutrophils % 09/13/2023 81.8  % Final    Lymphocytes % 09/13/2023 6.5  % Final    Monocytes % 09/13/2023 2.7  % Final    Eosinophils % 09/13/2023 6.0  % Final    Basophils % 09/13/2023 3.0  % Final    Absolute Neutrophils 09/13/2023 20.3 (H)  1.8 - 7.8 10*9/L Final    Absolute Lymphocytes 09/13/2023 1.6  1.1 - 3.6 10*9/L Final    Absolute Monocytes 09/13/2023 0.7  0.3 - 0.8 10*9/L Final    Absolute Eosinophils 09/13/2023 1.5 (H)  0.0 - 0.5 10*9/L Final    Absolute Basophils 09/13/2023 0.7 (H)  0.0 - 0.1 10*9/L Final    Anisocytosis 09/13/2023 Marked (A)  Not Present Final    Urine Culture, Comprehensive 09/13/2023 50,000 to 100,000 CFU/mL Streptococcus bovis group (A)   Final    Smear Review Comments 09/13/2023 See Comment  Undefined Final    Smear Reviewed  Myelocytes Present  Promyelocytes Present Polychromasia 09/13/2023 Moderate (A)  Not Present Final    Basophilic Stippling 09/13/2023 Present (A)  Not Present Final    Neutrophil Left Shift 09/13/2023 Present (A)  Not Present Final  Pathologist Smear Interpretation 09/13/2023 Confirmed by Hemepath Specialist/Senior Tech  Confirmed by Hemepath Fellow, Confirmed by Hemepath Specialist/Senior Tech, Confirmed by Core Specialist/Senior Tech, To Be Accessioned - Case Report to Follow, Physician Requested Path Review - BF/CSF: cytospin routed to hemepath, Physician Requested... Final   Office Visit on 09/11/2023   Component Date Value Ref Range Status    Blood Pressure Monitoring 09/11/2023 136/66   Final    Supine    Blood Pressure Monitoring 09/11/2023 129/68   Final    Sitting    Blood Pressure Monitoring 09/11/2023 123/61   Final    Standing        IMAGING STUDIES    CT abdomen pelvis from 09/13/2023 : SPLEEN: Splenomegaly, slightly increased in size when compared to 01/14/2023, now measuring approximately 21 cm in the greatest craniocaudal dimension       The total time spent discussing the previous history, imaging studies, laboratory studies, the role and rationale of ***, and discussion was 60 minutes.  At least 50% of that time was spent in answering questions and counseling.    FOLLOW UP: AS DIRECTED       Ccc:             [1]   Past Medical History:  Diagnosis Date    Anxiety     Arthritis     Asthma (HHS-HCC)     Cancer         Cataract     Chronic kidney disease     Depression     Difficult intravenous access     GERD (gastroesophageal reflux disease)     Heart failure         HLD (hyperlipidemia)     Hypertension     Myelofibrosis         Nephrolithiasis     Sleep apnea     Tinnitus    [2]   Past Surgical History:  Procedure Laterality Date    HYSTERECTOMY      PR CYSTO/URETERO W/LITHOTRIPSY &INDWELL STENT INSRT Left 08/21/2018    Procedure: priority CYSTOURETHROSCOPY, WITH URETEROSCOPY AND/OR PYELOSCOPY; WITH LITHOTRIPSY INCLUDING INSERTION OF INDWELLING URETERAL STENT;  Surgeon: Nicholaus Mt Viprakasit, MD;  Location: CYSTO PROCEDURE SUITES [facility];  Service: Urology    PR CYSTO/URETERO/PYELOSCOPY, CALCULUS TX Left 09/04/2018    Procedure: CYSTOURETHROSCOPY, W/URETEROSCOPY &/OR PYELOSCOPY; W/REMOVAL/MANIPULATION CALCULUS(URETERAL CATH INCLUDED);  Surgeon: Nicholaus Mt Viprakasit, MD;  Location: CYSTO PROCEDURE SUITES [facility];  Service: Urology    PR CYSTOSCOPY,INSERT URETERAL STENT Left 05/04/2022    Procedure: CYSTOURETHROSCOPY,  WITH INSERTION OF INDWELLING URETERAL STENT (EG, GIBBONS OR DOUBLE-J TYPE);  Surgeon: Drena Darryle Gull, MD;  Location: MAIN OR [facility];  Service: Urology    PR CYSTOSCOPY,REMV CALCULUS,SIMPLE Left 09/04/2018    Procedure: CYSTOURETHROSCOPY, WITH REMOVAL OF FOREIGN BODY, CALCULUS OR URETERAL STENT FROM URETHRA OR BLADDER; SIMPLE;  Surgeon: Nicholaus Mt Viprakasit, MD;  Location: CYSTO PROCEDURE SUITES [facility];  Service: Urology    PR CYSTOURETHROSCOPY,URETER CATHETER Left 08/21/2018    Procedure: CYSTOURETHROSCOPY, W/URETERAL CATHETERIZATION, W/WO IRRIG, INSTILL, OR URETEROPYELOG, EXCLUS OF RADIOLG SVC;  Surgeon: Nicholaus Mt Viprakasit, MD;  Location: CYSTO PROCEDURE SUITES [facility];  Service: Urology    PR CYSTOURETHROSCOPY,URETER CATHETER Left 09/04/2018    Procedure: CYSTOURETHROSCOPY, W/URETERAL CATHETERIZATION, W/WO IRRIG, INSTILL, OR URETEROPYELOG, EXCLUS OF RADIOLG SVC;  Surgeon: Nicholaus Mt Viprakasit, MD;  Location: CYSTO PROCEDURE SUITES [facility];  Service: Urology    PR PERQ DILATION XST TRC NEW ACCESS RENAL COLTJ SYS Left 04/24/2022    Procedure:  DILATION OF EXISTING TRACT, PERC, FOR ENDOUROLOGIC PROC INCL IMAGING GUIDANCE & RADIOLOGIC SUPERVISION & INTERPRET, W POSTPROC TUBE PLACEMENT, WHEN PERF; INCL NEW ACCESS INTO RENAL COLLECTING SYSTEM;  Surgeon: Mitch Alm Pickles, MD;  Location: [facility] OR [facility];  Service: Urology    PR PERQ NL/PL LITHOTRP COMPLEX >2 CM MLT LOCATIONS Left 04/24/2022    Procedure: standard PCNL prone vs. standard; need fortec;  Surgeon: Mitch Alm Pickles, MD;  Location: [facility] OR [facility];  Service: Urology    PR REMOVE BLADDER STONE,<2.5 CM N/A 08/21/2018    Procedure: LITHOLAPAXY: CRUSH/FRAGMENT CALCULUS, BY ANY MEANS IN BLADDER & REMOVAL OF FRAGMENTS; SIMPLE/SMALL <2.5 CM;  Surgeon: Nicholaus Mt Viprakasit, MD;  Location: CYSTO PROCEDURE SUITES [facility];  Service: Urology    SALPINGOOPHORECTOMY Right     12/07/2010    VAGINAL HYSTERECTOMY      12/07/2010   [3]   Allergies  Allergen Reactions    Fentanyl Anaphylaxis    Codeine Other (See Comments)     Headache, nausea    Corticosteroids (Glucocorticoids) Headache     **ALL STEROIDS**    Gabapentin Other (See Comments)     oversedation    Prednisone  Headache    Pregabalin Rash     Swelling and general fatigue   [4]   Current Outpatient Medications:     albuterol  HFA 90 mcg/actuation inhaler, Inhale 2 puffs every six (6) hours as needed for wheezing or shortness of breath., Disp: 8 g, Rfl: 3    cholecalciferol , vitamin D3-50 mcg, 2,000 unit,, 50 mcg (2,000 unit) tablet, Take 1 tablet (50 mcg total) by mouth in the morning., Disp: , Rfl:     conjugated estrogens  (PREMARIN ) 0.625 mg/gram vaginal cream, Insert 0.5 g into the vagina Two (2) times a week., Disp: 30 g, Rfl: 1    diclofenac  sodium (VOLTAREN ) 1 % gel, Apply 4 g topically four (4) times a day as needed for arthritis or pain (Knee pain)., Disp: 100 g, Rfl: 2    fluticasone propionate (FLONASE) 50 mcg/actuation nasal spray, 2 sprays into each nostril daily as needed for allergies., Disp: , Rfl:     furosemide  (LASIX ) 40 MG tablet, Take 1 tablet (40 mg total) by mouth daily., Disp: 90 tablet, Rfl: 3    levocetirizine (XYZAL) 5 MG tablet, Take 1 tablet (5 mg total) by mouth every evening., Disp: 90 tablet, Rfl: 3    lidocaine (LIDODERM) 5 % patch, Place 1 patch on the skin daily. Apply to affected area for 12 hours only each day (then remove patch), Disp: , Rfl:     lovastatin  (MEVACOR ) 40 MG tablet, Take 1 tablet (40 mg total) by mouth daily., Disp: 90 tablet, Rfl: 1    miscellaneous medical supply Misc, Rollator for daily use with ambulation dx: M48.061, Disp: , Rfl:     momelotinib (OJJAARA ) 100 mg tablet, Take 1 tablet (100 mg total) by mouth daily. Swallow tablets whole; do not cut, crush, or chew., Disp: 30 tablet, Rfl: 2    NARCAN 4 mg/actuation nasal spray, 1 spray into alternating nostrils once as needed., Disp: , Rfl: 0    omeprazole  (PRILOSEC) 40 MG capsule, Take 1 capsule (40 mg total) by mouth daily., Disp: 90 capsule, Rfl: 3    ondansetron  (ZOFRAN -ODT) 4 MG disintegrating tablet, Dissolve 1 tablet (4 mg total) in the mouth every eight (8) hours as needed for nausea., Disp: 15 tablet, Rfl: 3    oxyCODONE -acetaminophen (PERCOCET) 10-325 mg per tablet, Take 1 tablet by  mouth every six (6) hours as needed for pain., Disp: , Rfl:     polyethylene glycol (GOLYTELY ) 236-22.74-6.74 gram solution, Take by mouth as directed per [facility] GI prep instructions, for split bowel prep., Disp: 4000 mL, Rfl: 0    sertraline  (ZOLOFT ) 25 MG tablet, Take 1 tablet (25 mg total) by mouth daily., Disp: 90 tablet, Rfl: 3    spironolactone  (ALDACTONE ) 25 MG tablet, Take 1 tablet (25 mg total) by mouth daily., Disp: 90 tablet, Rfl: 2    ULTRA-LIGHT ROLLATOR Misc, Use as directed., Disp: , Rfl: 0  [5]   Family History  Problem Relation Age of Onset    Hypertension Mother     Cancer Brother         bone    Hypertension Brother     Hypertension Brother     Hypertension Brother     Anesthesia problems Neg Hx     Bleeding Disorder Neg Hx     Breast cancer Neg Hx     Ovarian cancer Neg Hx     Colon cancer Neg Hx     Endometrial cancer Neg Hx    [6]   Social History  Socioeconomic History    Marital status: Married   Tobacco Use    Smoking status: Never    Smokeless tobacco: Never   Vaping Use    Vaping status: Never Used   Substance and Sexual Activity    Alcohol use: No    Drug use: No    Sexual activity: Not Currently     Social Drivers of Health     Financial Resource Strain: Low Risk  (04/25/2022)    Overall Financial Resource Strain (CARDIA)     Difficulty of Paying Living Expenses: Not hard at all   Food Insecurity: No Food Insecurity (05/17/2023)    Hunger Vital Sign     Worried About Running Out of Food in the Last Year: Never true     Ran Out of Food in the Last Year: Never true   Transportation Needs: No Transportation Needs (05/17/2023)    PRAPARE - Therapist, art (Medical): No     Lack of Transportation (Non-Medical): No   Stress: No Stress Concern Present (08/09/2023)    [employer] of Occupational Health - Occupational Stress Questionnaire     Feeling of Stress: Not at all   Housing: Low Risk  (05/17/2023)    Housing     Within the past 12 months, have you ever stayed: outside, in a car, in a tent, in an overnight shelter, or temporarily in someone else's home (i.e. couch-surfing)?: No     Are you worried about losing your housing?: No

## 2023-10-05 NOTE — Unmapped (Signed)
 [facility]  Hematology Oncology Consultation    DATE OF SERVICE  10/05/2023     REFERRING PROVIDER   Emily Walter, Agnp  [address]  [id number]  [place],  [place] [address]    PRIMARY CARE PROVIDER  Emily Emmie Dais, MD  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    CBC from 08/03/2016: WBC 10.3 x 10^9/L, Hgb 12.3 g/dL, MCV 85, plt 52 x 89^0/O.    Due to the cytopenias, [facility] was sent to Dr. India Walter and underwent a bone marrow biopsy. I currently do not have bone marrow slides to review.  Outside [facility] report from 08/03/2016 states,  Hypercellular bone marrow (85%) with hyperplastic hematopoiesis.  Atypical megakaryocyte hyperplasia with focal clustering.  Abnormal localization of immature myeloid precursors.  No significant increase in blasts.  Mild to moderate reticulin fibrosis.    Driver mutation = SRSF2 (2018)  Karyotype = 46,XX,add(4)(q33),add(17)(q21)[13]     Higher-risk disease, with thrombocytopenia, SRSF2 mutation.      She was not an optimal alloHSCT candidate due to multiple co-morbidities.      She trialed ruxolitinib  but did not have a good response.     07/14/2022:  Received Infed  1g IV for iron  deficiency.     02/21/2023 Progression    MYELODYSPLASTIC PROGRESSION     She has been on observation alone for several years.  She now has progressive cytopenias, with platelets trending down to teens from 40-50's in April 2024 and hgb trending down from 11-12 g/dL in Feb/March 2024 to 9.9 g/dL now.  WBC has progressively increased, was 10 in 05/2022 and now consistently >15. Myeloid mutation panel with persistent SRSF2 mutation and new ASXL1 mutation with VAF 5%. In this context, a bone marrow biopsy was performed at [facility] on 02/21/23.  This showed persistent myelofibrosis with dysplastic features, consistent with MDS/MPN or perhaps MDS with myelofibrosis.      Karyotype is pending, but prior karyotype 46,XX with add(4) and add(17).       The bone marrow now appears most consistent with MDS.    For prognostic calculations, I used labs from 02/21/23: ANC 15, hgb 9.9, plts 18, bm blasts 2%, age 13yo, intermediate karyotype, SRSF2, ASXL1.      IPSS:  Int-1 (1 pt)  IPSS-R: Intermediate. (4pt) 39yr median overall survival.  25% AML transformation 3.2 yrs.   IPSS-M: Moderate-High. Leukemia-free survival 2.3 yrs. Overall survival 2.8 yrs.      03/11/2023 -  Chemotherapy    MOMELOTINIB    Indication = primary myelofibrosis with anemia   Starting dose = 100mg  po daily (reduced for platelet count 18)    03/12/23: WBC 24.7, Hgb 10, Plts 18. sCr 1.34.   04/09/23: WBC 26.6, Hgb 10.6, Plts 17.  sCr 1.34. 4% myelocytes, 4% metamyelocytes.   05/10/23: WBC 21.8, hgb 11.6, plt 18  06/11/23: Patient stopped momelotinib without team's knowledge due to what she considered new side effects  06/18/23: WBC 24.4, hgb 10.4, plt 13, sCr 1.46  CANCER STAGING  Cancer Staging   No matching staging information was found for the patient.      SUMMARY/ASSESSMENT    73 y.o. female with CHF, COPD, stage IIIa chronic kidney disease, lumbar stenosis with myelofibrosis    RECOMMENDATION/PLAN    Myelofibrosis-    07/24/2016  BM bx- marrow 80 to 90% cellular with trilineage hematopoiesis, increased megakaryocytes.    Reticulin fibrosis increased (MF 1-2)  Cytogenetics-46,XX,add(4)(q33),add(17)(q21)[13]   Myeloid mutation panel-SRSF2: c.284C>T (p.Pro95Leu).  Associated with poor prognosis in MDS     12/04/2016:  Formally diagnosed with myelofibrosis.    DIPSS-plus:  Intermediate 2 risk, corresponding to median survival of 2.9 years  MIPPS70-plus:  High-risk, given the SRSF2 and the high-risk cytogenetics. Median survival 3.9 yrs   12/22/2016:  Discussion of allogeneic stem cell trasnplant, but declined further consideration   04/23/2017 through 08/2017:  Ruxolitinib .  Stopped bc of worsening DOE. Fedratinib considered to help with splenomegaly. Placed on observation     01/23/2023:  Myeloid mutation panel  Pathogenic   ASXL1 c.2077C>T p.(Arg693*)VAF: 5.3%   Pathogenic SRSF2 c.284C>T p.(Pro95Leu)       02/21/2023:  BM bx- cellularity 90%  with disorganized trilineage hematopoiesis, megakaryocytic dysplasia (2% blasts by manual touch prep differential and <5% CD34-positive blasts by immunohistochemical analysis)    Reticulin and trichrome stains reveal a patchy dense increase in reticulin fibrosis and no significant collagen fibrosis. (MF1-2).    Cytogenetics:  46,XX,add(4)(q33),add(17)(q21)[20]     WBC 19.3 hemoglobin 9.9 platelet 18; 84 segs 9 lymphs 3 monos 3 eos  Primary myelofibrosis, intermediate-2 by DIPSS-Plus, High-risk by MIPSS70-Plus - with dysplastic progression      Emergence of new mutation, dysplasia, increasing fibrosis coupled with increasing spleen size concerning.      03/11/2023:  Began Momelotinib 100 mg daily due to clinical, molecular and radiographic progression.    06/18/2023:  Abdominal U/S- Splenomegaly with largest diameter measuring 17.3 cm and calculated volume of 1147 mL      09/13/2023: CT AP- Spleen increased in size when compared to 01/14/2023, now 21 cm.   Deep inguinal and iliac chain lymphadenopathy     Therapeutics  10/05/2023:  Patient has numerous comorbidities which limit therapeutic options.  She is not a candidate for allo-transplant.   Treatment is therefore palliative.   Remains on Momelotinib- however she is having continued issues with fatigue, LUQ abdominal pain, cytopenias and worsening splenomegaly.          HISTORY OF PRESENT ILLNESS     Emily Walter is a 73 y.o. female with CHF, COPD, stage IIIa chronic kidney disease, lumbar stenosis    05/23/2015:  WBC 13.6 Hgb 13.4 PLT 105.  Chemistries notable for creatinine 1.15 glucose 125  08/03/2016: Bone marrow biopsy-hypercellular marrow (80 to 90%) with trilineage hematopoiesis with increased megakaryocytes.  Reticulin fibrosis increased (MF 1-2)  Cytogenetics-46,XX,add(4)(q33),add(17)(q21)[13] [This is an incomplete study given only 13 metaphases were obtained from this patient's sample instead of 20]   Myeloid mutation panel-SRSF2: c.284C>T (p.Pro95Leu).  This is a missense mutation, and in MDS/MPN overlap syndromes and associated with poor prognosis in MDS    10/23/2016: WBC 10.3 hemoglobin 12.2 platelet 67; ANC 8.1 ALC 1.5.  EPO 83  CMP notable for creatinine 1.06  Ferritin 34.1    12/04/2016:  Formally diagnosed with myelofibrosis.    DIPSS-plus:  Intermediate 2 risk, corresponding to median survival of 2.9 years  MIPPS70-plus:  High-risk, given the SRSF2 and the high-risk cytogenetics. Median overall survival 3.9 years in  the [facility] cohort    12/22/2016:  Discussion of allogeneic stem cell trasnplant, but declined further consideration   04/23/2017 through 08/2017:  Treated with Ruxolitinib .  Stopped because of worsening DOE. Fedratinib discussed as this might help splenomegaly.      01/14/2018: Splenic ultrasound-spleen size 14.6 cm    04/26/2022:  Cardiac ECHO- LVEF 60-65%.  LV with normal wall thickness, normal diastolic function.  PAP  22 mm Hg.      01/23/2023:  Myeloid mutation panel     Contains abnormal data ASXL1 ( Pathogenic )   ASXL1 c.2077C>T p.(Arg693*)VAF: 5.3%    Contains abnormal data SRSF2 ( Pathogenic )   SRSF2 c.284C>T p.(Pro95Leu)    02/21/2023:  Bone marrow bx- Hypercellular bone marrow (90%) with disorganized trilineage hematopoiesis, megakaryocytic dysplasia (2% blasts by manual touch prep differential and <5% CD34-positive blasts by immunohistochemical analysis)  Reticulin and trichrome stains reveal a patchy dense increase in reticulin fibrosis and no significant collagen fibrosis. (MF1-2).    Cytogenetics:  46,XX,add(4)(q33),add(17)(q21)[20]     WBC 19.3 hemoglobin 9.9 platelet 18; 84 segs 9 lymphs 3 monos 3 eos  Primary myelofibrosis, intermediate-2 by DIPSS-Plus, High-risk by MIPSS70-Plus - with dysplastic progression      03/11/2023:  Began Momelotinib 100 mg daily due to clinical, molecular and radiographic progression.  After stopping Ruxolitinib   had been on observation  06/18/2023:  Abdominal U/S- Splenomegaly with largest diameter measuring 17.3 cm and calculated volume of 1147 mL      09/13/2023: CT AP- Spleen increased in size when compared to 01/14/2023, now 21 cm.   Deep inguinal and iliac chain lymphadenopathy     10/05/2023:  [facility]- Hematology Consult  Fatigued.  ECOG 2      PAST MEDICAL HISTORY  Past Medical History[1]    SURGICAL HISTORY  Past Surgical History[2]    GYNECOLOGIC HISTORY  Menstrual History:  OB History       Gravida   2    Para   2    Term   2    Preterm        AB        Living             SAB        IAB        Ectopic        Molar        Multiple        Live Births                        ALLERGIES:  Allergies[3]    MEDICATIONS:  Current Medications[4]     FAMILY HISTORY  Family History[5]    SOCIAL HISTORY  Social History[6]     REVIEW OF SYSTEMS  Constitutional: No fever, shaking chills. Night sweats occur regularly and not better since starting momelotinib.  Has lost 20 lbs over the past 2 years- most of it last year- currently stable.   ECOG 2.    HEENT: No visual changes or hearing deficit. No changes in voice.  Mouth sores- mainly on side of tongue and edge of mouth- not using anything for them as they are intermittent.       Pulmonary: No unusual cough, sore throat, or orthopnea.   Cardiovascular: DOE walking and chest pain on walking < 50 feet associated with palpitations.       Gastrointestinal: No nausea, vomiting.  Some LUQ abdominal discomfort which radiates  to the back.   Genitourinary: Frequency, urgency due to diuretics.     Musculoskeletal: No arthralgias or myalgias; no back pain;   Hematologic: No bleeding tendency or easy bruisability.   Skin: No rash, scaling, sores, lumps, or jaundice.  Vascular: No peripheral arterial or venous thromboembolic disease.   Psychological: No anxiety, depression, or mood changes; no mental health illnesses.   Neurological: Some numbness of feet and toes.  No syncope, or near syncopal episodes.  Clemens twice last month- once tripped and fell, another episode occurred at home when she was dizzy.      PHYSICAL EXAMINATION  BP 123/73  - Pulse 80  - Temp 37 ??C (98.6 ??F) (Oral)  - Resp 16  - Ht 157.5 cm (5' 2)  - Wt 98.8 kg (217 lb 12.8 oz)  - SpO2 94%  - BMI 39.84 kg/m??      General:   Comfortable.  Here with husband.  Chronically ill appearing   Eyes:   Pupil equal round reacting to light and accomodation.  Extra occular muscles intact, and sclera clear and without icteris.   ENT:   Oropharynx without mucositis, or thrush.     Neck:   Supple without any enlargement, no thyromegaly, bruit, or jugular venous distention.   Lymph Nodes:  No adenopathy (cervical, supraclavicular, axillary, inguinal)   Cardiovascular:  RRR, normal S1, S2 without murmur, rub, or gallop.  Pulses 2+ equal on both sides without any bruits.   Lungs:  Clear to auscultation bilaterally, without wheezes/crackles/rhonchi.  Good air movement.   Skin:    No rash.lesions/breakdown   Breast:    No nodules, masses or discharge   Psychiatry:   Alert and oriented to person, place, and time    Abdomen:   Normoactive bowel sounds, abdomen soft, non-tender and not distended, no Hepatosplenomegaly or masses.  Liver normal in size, no rebound or guarding.    Genito Urinary:   Genitalia within normal limits, no discharge or lesions.   Rectal:    Normal tone, no blood in the stool, no masses.   Extremities:   No bilateral cyanosis, clubbing or edema.  No rash, lesions, or petechiae.   Musculo Skeletal:   No joint tenderness, deformity, effusions.  No spine or costovertebral angle tenderness.  Full range of motion in shoulder, elbow, hip, knee, ankle, hands and feet.   Neurological:  Alert and oriented to person, place and time.  Cranial nerves II-XII grossly intact, normal gait, normal sensation throughout, normal cerebellar function.           LABORATORY STUDIES  Admission on 09/13/2023, Discharged on 09/14/2023   Component Date Value Ref Range Status    Color, UA 09/13/2023 Yellow   Final    Clarity, UA 09/13/2023 Turbid   Final    Specific Gravity, UA 09/13/2023 1.014  1.003 - 1.030 Final    pH, UA 09/13/2023 5.5  5.0 - 9.0 Final    Leukocyte Esterase, UA 09/13/2023 Small (A)  Negative Final    Nitrite, UA 09/13/2023 Negative  Negative Final    Protein, UA 09/13/2023 Negative  Negative Final    Glucose, UA 09/13/2023 Negative  Negative Final    Ketones, UA 09/13/2023 Negative  Negative Final    Urobilinogen, UA 09/13/2023 <2.0 mg/dL  <7.9 mg/dL Final    Bilirubin, UA 09/13/2023 Negative  Negative Final    Blood, UA 09/13/2023 Moderate (A)  Negative Final    RBC, UA 09/13/2023 63 (H)  <=4 /HPF Final  WBC, UA 09/13/2023 <1  0 - 5 /HPF Final    Squam Epithel, UA 09/13/2023 2  0 - 5 /HPF Final    Bacteria, UA 09/13/2023 Rare (A)  None Seen /HPF Final    Mucus, UA 09/13/2023 Rare (A)  None Seen /HPF Final    Sodium 09/13/2023 146 (H)  135 - 145 mmol/L Final    Potassium 09/13/2023 4.2  3.4 - 4.8 mmol/L Final    Chloride 09/13/2023 99  98 - 107 mmol/L Final    CO2 09/13/2023 32.2 (H)  20.0 - 31.0 mmol/L Final    Anion Gap 09/13/2023 15 (H)  5 - 14 mmol/L Final    BUN 09/13/2023 22  9 - 23 mg/dL Final    Creatinine 91/92/7974 1.81 (H)  0.55 - 1.02 mg/dL Final    BUN/Creatinine Ratio 09/13/2023 12   Final    eGFR CKD-EPI (2021) Female 09/13/2023 29 (L)  >=60 mL/min/1.81m2 Final    eGFR calculated with CKD-EPI 2021 equation in accordance with [employer] and [employer] of Nephrology Task Force recommendations.    Glucose 09/13/2023 112  70 - 179 mg/dL Final    Calcium 91/92/7974 9.3  8.7 - 10.4 mg/dL Final    Albumin 91/92/7974 3.8  3.4 - 5.0 g/dL Final    Total Protein 09/13/2023 7.8  5.7 - 8.2 g/dL Final    Total Bilirubin 09/13/2023 1.2  0.3 - 1.2 mg/dL Final    AST 91/92/7974 18  <=34 U/L Final    ALT 09/13/2023 7 (L)  10 - 49 U/L Final    Alkaline Phosphatase 09/13/2023 94  46 - 116 U/L Final    PT 09/13/2023 14.1 (H)  9.9 - 12.6 sec Final    INR 09/13/2023 1.24   Final    APTT 09/13/2023 30.4  24.8 - 38.4 sec Final    Heparin Correlation 09/13/2023 0.2   Final    NOTE: This result is for use only with patients receiving heparin therapy in conjunction with the Heparin Nomogram.  In patients not receiving heparin, an elevated aPTT does not always imply anticoagulation.    Blood Type 09/13/2023 B POS   Final    Antibody Screen 09/13/2023 NEG   Final    WBC 09/13/2023 24.8 (H)  3.6 - 11.2 10*9/L Final    RBC 09/13/2023 4.09  3.95 - 5.13 10*12/L Final    HGB 09/13/2023 11.1 (L)  11.3 - 14.9 g/dL Final    HCT 91/92/7974 34.3  34.0 - 44.0 % Final    MCV 09/13/2023 84.0  77.6 - 95.7 fL Final    MCH 09/13/2023 27.2  25.9 - 32.4 pg Final    MCHC 09/13/2023 32.3  32.0 - 36.0 g/dL Final    RDW 91/92/7974 20.6 (H)  12.2 - 15.2 % Final    MPV 09/13/2023 9.4  6.8 - 10.7 fL Final    Platelet 09/13/2023 19 (L)  150 - 450 10*9/L Final    nRBC 09/13/2023 3  <=4 /100 WBCs Final    Neutrophils % 09/13/2023 81.8  % Final    Lymphocytes % 09/13/2023 6.5  % Final    Monocytes % 09/13/2023 2.7  % Final    Eosinophils % 09/13/2023 6.0  % Final    Basophils % 09/13/2023 3.0  % Final    Absolute Neutrophils 09/13/2023 20.3 (H)  1.8 - 7.8 10*9/L Final    Absolute Lymphocytes 09/13/2023 1.6  1.1 - 3.6 10*9/L Final    Absolute  Monocytes 09/13/2023 0.7  0.3 - 0.8 10*9/L Final    Absolute Eosinophils 09/13/2023 1.5 (H)  0.0 - 0.5 10*9/L Final    Absolute Basophils 09/13/2023 0.7 (H)  0.0 - 0.1 10*9/L Final    Anisocytosis 09/13/2023 Marked (A)  Not Present Final    Urine Culture, Comprehensive 09/13/2023 50,000 to 100,000 CFU/mL Streptococcus bovis group (A)   Final    Smear Review Comments 09/13/2023 See Comment  Undefined Final    Smear Reviewed  Myelocytes Present  Promyelocytes Present    Polychromasia 09/13/2023 Moderate (A)  Not Present Final    Basophilic Stippling 09/13/2023 Present (A)  Not Present Final    Neutrophil Left Shift 09/13/2023 Present (A)  Not Present Final    Pathologist Smear Interpretation 09/13/2023 Confirmed by Hemepath Specialist/Senior Tech  Confirmed by Hemepath Fellow, Confirmed by Hemepath Specialist/Senior Tech, Confirmed by Core Specialist/Senior Tech, To Be Accessioned - Case Report to Follow, Physician Requested Path Review - BF/CSF: cytospin routed to hemepath, Physician Requested... Final   Office Visit on 09/11/2023   Component Date Value Ref Range Status    Blood Pressure Monitoring 09/11/2023 136/66   Final    Supine    Blood Pressure Monitoring 09/11/2023 129/68   Final    Sitting    Blood Pressure Monitoring 09/11/2023 123/61   Final    Standing        The total time spent discussing the previous history, imaging studies, laboratory studies, the role and rationale of treating myelofibrosis, and discussion was 120 minutes.  At least 50% of that time was spent in answering questions and counseling.    FOLLOW UP: AS DIRECTED       Ccc:               [1]   Past Medical History:  Diagnosis Date    Anxiety     Arthritis     Asthma (HHS-HCC)     Cancer         Cataract     Chronic kidney disease     Depression     Difficult intravenous access     GERD (gastroesophageal reflux disease)     Heart failure         HLD (hyperlipidemia)     Hypertension     Myelofibrosis         Nephrolithiasis     Sleep apnea     Tinnitus    [2]   Past Surgical History:  Procedure Laterality Date    HYSTERECTOMY      PR CYSTO/URETERO W/LITHOTRIPSY &INDWELL STENT INSRT Left 08/21/2018    Procedure: priority CYSTOURETHROSCOPY, WITH URETEROSCOPY AND/OR PYELOSCOPY; WITH LITHOTRIPSY INCLUDING INSERTION OF INDWELLING URETERAL STENT;  Surgeon: Nicholaus Mt Viprakasit, MD;  Location: CYSTO PROCEDURE SUITES [facility];  Service: Urology    PR CYSTO/URETERO/PYELOSCOPY, CALCULUS TX Left 09/04/2018    Procedure: CYSTOURETHROSCOPY, W/URETEROSCOPY &/OR PYELOSCOPY; W/REMOVAL/MANIPULATION CALCULUS(URETERAL CATH INCLUDED);  Surgeon: Nicholaus Mt Viprakasit, MD;  Location: CYSTO PROCEDURE SUITES [facility];  Service: Urology    PR CYSTOSCOPY,INSERT URETERAL STENT Left 05/04/2022    Procedure: CYSTOURETHROSCOPY,  WITH INSERTION OF INDWELLING URETERAL STENT (EG, GIBBONS OR DOUBLE-J TYPE);  Surgeon: Drena Darryle Gull, MD;  Location: MAIN OR [facility];  Service: Urology    PR CYSTOSCOPY,REMV CALCULUS,SIMPLE Left 09/04/2018    Procedure: CYSTOURETHROSCOPY, WITH REMOVAL OF FOREIGN BODY, CALCULUS OR URETERAL STENT FROM URETHRA OR BLADDER; SIMPLE;  Surgeon: Nicholaus Mt Viprakasit, MD;  Location: CYSTO PROCEDURE SUITES [facility];  Service: Urology  PR CYSTOURETHROSCOPY,URETER CATHETER Left 08/21/2018    Procedure: CYSTOURETHROSCOPY, W/URETERAL CATHETERIZATION, W/WO IRRIG, INSTILL, OR URETEROPYELOG, EXCLUS OF RADIOLG SVC;  Surgeon: Nicholaus Mt Viprakasit, MD;  Location: CYSTO PROCEDURE SUITES [facility];  Service: Urology    PR CYSTOURETHROSCOPY,URETER CATHETER Left 09/04/2018    Procedure: CYSTOURETHROSCOPY, W/URETERAL CATHETERIZATION, W/WO IRRIG, INSTILL, OR URETEROPYELOG, EXCLUS OF RADIOLG SVC;  Surgeon: Nicholaus Mt Viprakasit, MD;  Location: CYSTO PROCEDURE SUITES [place];  Service: Urology    PR PERQ DILATION XST TRC NEW ACCESS RENAL COLTJ SYS Left 04/24/2022    Procedure: DILATION OF EXISTING TRACT, PERC, FOR ENDOUROLOGIC PROC INCL IMAGING GUIDANCE & RADIOLOGIC SUPERVISION & INTERPRET, W POSTPROC TUBE PLACEMENT, WHEN PERF; INCL NEW ACCESS INTO RENAL COLLECTING SYSTEM;  Surgeon: Mitch Alm Pickles, MD;  [facility] OR [facility]  Service: Urology    PR PERQ NL/PL LITHOTRP COMPLEX >2 CM MLT LOCATIONS Left 04/24/2022    Procedure: standard PCNL prone vs. standard; need fortec;  Surgeon: Mitch Alm Pickles, MD;  Location: [facility] OR [facility];  Service: Urology    PR REMOVE BLADDER STONE,<2.5 CM N/A 08/21/2018    Procedure: LITHOLAPAXY: CRUSH/FRAGMENT CALCULUS, BY ANY MEANS IN BLADDER & REMOVAL OF FRAGMENTS; SIMPLE/SMALL <2.5 CM;  Surgeon: Nicholaus Mt Viprakasit, MD;  Location: CYSTO PROCEDURE SUITES [facility];  Service: Urology    SALPINGOOPHORECTOMY Right     12/07/2010    VAGINAL HYSTERECTOMY      12/07/2010   [3]   Allergies  Allergen Reactions    Fentanyl Anaphylaxis    Codeine Other (See Comments)     Headache, nausea    Corticosteroids (Glucocorticoids) Headache     **ALL STEROIDS**    Gabapentin Other (See Comments)     oversedation    Prednisone  Headache    Pregabalin Rash     Swelling and general fatigue   [4]   Current Outpatient Medications:     albuterol  HFA 90 mcg/actuation inhaler, Inhale 2 puffs every six (6) hours as needed for wheezing or shortness of breath., Disp: 8 g, Rfl: 3    cholecalciferol , vitamin D3-50 mcg, 2,000 unit,, 50 mcg (2,000 unit) tablet, Take 1 tablet (50 mcg total) by mouth in the morning., Disp: , Rfl:     diclofenac  sodium (VOLTAREN ) 1 % gel, Apply 4 g topically four (4) times a day as needed for arthritis or pain (Knee pain)., Disp: 100 g, Rfl: 2    fluticasone propionate (FLONASE) 50 mcg/actuation nasal spray, 2 sprays into each nostril daily as needed for allergies., Disp: , Rfl:     furosemide  (LASIX ) 40 MG tablet, Take 1 tablet (40 mg total) by mouth daily., Disp: 90 tablet, Rfl: 3    lovastatin  (MEVACOR ) 40 MG tablet, Take 1 tablet (40 mg total) by mouth daily., Disp: 90 tablet, Rfl: 1    momelotinib (OJJAARA ) 100 mg tablet, Take 1 tablet (100 mg total) by mouth daily. Swallow tablets whole; do not cut, crush, or chew., Disp: 30 tablet, Rfl: 2    omeprazole  (PRILOSEC) 40 MG capsule, Take 1 capsule (40 mg total) by mouth daily., Disp: 90 capsule, Rfl: 3    ondansetron  (ZOFRAN -ODT) 4 MG disintegrating tablet, Dissolve 1 tablet (4 mg total) in the mouth every eight (8) hours as needed for nausea., Disp: 15 tablet, Rfl: 3    oxyCODONE -acetaminophen (PERCOCET) 10-325 mg per tablet, Take 1 tablet by mouth every six (6) hours as needed for pain., Disp: , Rfl:     sertraline  (ZOLOFT ) 25 MG tablet, Take 1 tablet (25 mg  total) by mouth daily., Disp: 90 tablet, Rfl: 3    spironolactone  (ALDACTONE ) 25 MG tablet, Take 1 tablet (25 mg total) by mouth daily., Disp: 90 tablet, Rfl: 2    conjugated estrogens  (PREMARIN ) 0.625 mg/gram vaginal cream, Insert 0.5 g into the vagina Two (2) times a week., Disp: 30 g, Rfl: 1    levocetirizine (XYZAL) 5 MG tablet, Take 1 tablet (5 mg total) by mouth every evening. (Patient not taking: Reported on 10/05/2023), Disp: 90 tablet, Rfl: 3    lidocaine (LIDODERM) 5 % patch, Place 1 patch on the skin daily. Apply to affected area for 12 hours only each day (then remove patch), Disp: , Rfl:     miscellaneous medical supply Misc, Rollator for daily use with ambulation dx: M48.061, Disp: , Rfl:     NARCAN 4 mg/actuation nasal spray, 1 spray into alternating nostrils once as needed., Disp: , Rfl: 0    polyethylene glycol (GOLYTELY ) 236-22.74-6.74 gram solution, Take by mouth as directed per [facility] GI prep instructions, for split bowel prep. (Patient not taking: Reported on 10/05/2023), Disp: 4000 mL, Rfl: 0    ULTRA-LIGHT ROLLATOR Misc, Use as directed., Disp: , Rfl: 0  [5]   Family History  Problem Relation Age of Onset    Hypertension Mother     Cancer Brother         bone    Hypertension Brother     Hypertension Brother     Hypertension Brother     Anesthesia problems Neg Hx     Bleeding Disorder Neg Hx     Breast cancer Neg Hx     Ovarian cancer Neg Hx     Colon cancer Neg Hx     Endometrial cancer Neg Hx    [6]   Social History  Socioeconomic History    Marital status: Married   Tobacco Use    Smoking status: Never Smokeless tobacco: Never   Vaping Use    Vaping status: Never Used   Substance and Sexual Activity    Alcohol use: No    Drug use: No    Sexual activity: Not Currently     Social Drivers of Health     Financial Resource Strain: Low Risk  (04/25/2022)    Overall Financial Resource Strain (CARDIA)     Difficulty of Paying Living Expenses: Not hard at all   Food Insecurity: No Food Insecurity (05/17/2023)    Hunger Vital Sign     Worried About Running Out of Food in the Last Year: Never true     Ran Out of Food in the Last Year: Never true   Transportation Needs: No Transportation Needs (05/17/2023)    PRAPARE - Therapist, art (Medical): No     Lack of Transportation (Non-Medical): No   Stress: No Stress Concern Present (08/09/2023)    [employer] of Occupational Health - Occupational Stress Questionnaire     Feeling of Stress: Not at all   Housing: Low Risk  (05/17/2023)    Housing     Within the past 12 months, have you ever stayed: outside, in a car, in a tent, in an overnight shelter, or temporarily in someone else's home (i.e. couch-surfing)?: No     Are you worried about losing your housing?: No

## 2023-10-09 NOTE — Unmapped (Signed)
 Assessment and Plan    Sherise was seen today for follow-up.    Diagnoses and all orders for this visit:    Acute pain of left knee  Physical exam findings indicative of ostearthritis. No joint swelling, stiffness, or erythema. Able to bear weight but movement somewhat limited secondary pain. Similar symptoms previously with the right knee which improved with steroid injection. Will obtain imaging and encourage conservative measures for now. If not improving, will consider ortho referral.   -     XR Knee 1 or 2 Views Left; Future  -     Continue Voltaren  gel   -     Continue Percocet per pain clinic   -     Offered physical therapy but patient declined at this time   -     Consider ortho referral pending X-rays and if symptoms not improved with conservative measures.     Lumbar stenosis with neurogenic claudication  Patient's current pain clinic is closing down. Requested a referral to a [place] location close to [place].   -     Pain Clinic; Future    Adnexal cyst  Discussed endovaginal ultrasound results showing 1.9cm simple cyst. Patient will need follow-up imaging in 12 months.     Other orders  -     INFLUENZA ADJUVANTED PF, IIV3(68YR UP)(FLUAD)      Follow up 4 weeks for knee pain       Reason for Visit:     Subjective    Emily Walter is a 73 y.o. year old female  who presents for a follow up visit.    History of Present Illness  Emily Walter is a 73 year old female who presents with left knee and discussion of endovaginal ultrasound    She has been experiencing severe pain in her left knee pain for the past two days. The pain is constant and significantly impacts her ability to walk, bend, and perform daily activities such as getting in and out of a vehicle and using the bathroom. She describes the pain as sore. She has been using Voltaren  cream and oxycodone  for pain relief, which provides some help. There is no history of trauma or injury to the left knee, and she denies any history of gout.    She has a history of arthritis in her right knee, which was previously treated with an ultrasound-guided injection, resulting in improvement. She has not had any x-rays done on the left knee, although x-rays were previously performed on the right knee. She uses a walker and a rollator at home to assist with mobility.    Her previous pain management provider, [facility] in [place], has closed, and she is seeking a new pain management doctor closer to her home in [place]. She has been on oxycodone  for chronic back and hip pain for a long time. She is also under the care of cancer doctors at [facility] in [place].    Regarding her gynecological health, she recently underwent an endovaginal ultrasound, which revealed a 1.9 cm ovarian cyst.     Meds and allergies were reviewed in Epic    Objective    Physical Exam  Gen: Well appearing, No distress  Vitals:    10/10/23 1030   BP: 122/70   Pulse: 75   Weight: 98 kg (216 lb)   Height: 160 cm (5' 3)     CV: RRR, no mrg  Pulm: CTAB, nl work of breathing  Ext: No c/c/e  Skin: warm and dry  MSK: 5/5 strength in all extremities, L knee pain with midline and lateral tenderness, no effusion palpated.     Records review:

## 2023-10-10 ENCOUNTER — Inpatient Hospital Stay: Admit: 2023-10-10 | Discharge: 2023-10-10 | Payer: Medicare (Managed Care)

## 2023-10-10 DIAGNOSIS — M25562 Pain in left knee: Principal | ICD-10-CM

## 2023-10-10 DIAGNOSIS — M48062 Spinal stenosis, lumbar region with neurogenic claudication: Principal | ICD-10-CM

## 2023-10-10 NOTE — Unmapped (Addendum)
 It was a pleasure seeing you in clinic today. We have made the following plan.     -- We have ordered an x-ray of your left knee. Please continue taking your percocet and using voltaren  for your pain. I have attached some knee exercises to help improve strength and mobility.   -- You are referred to the [facility] in [place]. Their phone number is [phone]   -- Their address is [address] [place], [place] [address]     Otherwise, we look forward to seeing you in clinic at your next appointment!

## 2023-10-11 DIAGNOSIS — D7581 Myelofibrosis: Principal | ICD-10-CM

## 2023-10-11 NOTE — Unmapped (Signed)
 Copied from CRM #[id number]. Topic: Access To Clinicians - Req Clinic Call Back  >> Oct 11, 2023  3:54 PM Bria J wrote:  The PAC has received an incoming clinical call:    Caller name: Nile Dorning callback number: [phone]  Relationship to Patient: self  Describe the reason for the call: pt is returning a call from Dr. Nelida about her xray results

## 2023-10-11 NOTE — Unmapped (Signed)
 Attempted to call x2 and discuss her knee x-ray showing severe OA. Left VM.     Garrie Bi, MD MPH

## 2023-10-12 DIAGNOSIS — D7581 Myelofibrosis: Principal | ICD-10-CM

## 2023-10-12 DIAGNOSIS — M1712 Unilateral primary osteoarthritis, left knee: Principal | ICD-10-CM

## 2023-10-12 NOTE — Unmapped (Signed)
 Discussed knee x-rays showing severe OA. Patient declined PT at this time. She is taking percocet chronic pain per pain clinic which somewhat helps with the knee pain. She's interested in steroid injections with ortho. Referral sent to ortho in [place] for further evaluation.

## 2023-10-19 ENCOUNTER — Ambulatory Visit: Admit: 2023-10-19 | Discharge: 2023-10-20 | Payer: Medicare (Managed Care)

## 2023-10-19 DIAGNOSIS — N1831 Stage 3a chronic kidney disease (CMS-HCC): Principal | ICD-10-CM

## 2023-10-19 DIAGNOSIS — M5416 Radiculopathy, lumbar region: Principal | ICD-10-CM

## 2023-10-19 DIAGNOSIS — M47816 Spondylosis without myelopathy or radiculopathy, lumbar region: Principal | ICD-10-CM

## 2023-10-19 DIAGNOSIS — F1190 Opioid use, unspecified, uncomplicated: Principal | ICD-10-CM

## 2023-10-19 DIAGNOSIS — Z6837 Body mass index (BMI) 37.0-37.9, adult: Principal | ICD-10-CM

## 2023-10-19 DIAGNOSIS — E66812 Class 2 obesity with body mass index (BMI) of 37.0 to 37.9 in adult, unspecified obesity type, unspecified whether serious comorbidity present: Principal | ICD-10-CM

## 2023-10-19 NOTE — Unmapped (Signed)
 Patient Name: Emily Walter  Date of Birth: [birth year]  Primary Care Physician: Marnie Emmie Dais, MD       Lark was seen today for establish care.    Diagnoses and all orders for this visit:    Lumbar spondylosis    Lumbar radiculopathy    Chronic, continuous use of opioids    Stage 3a chronic kidney disease (CMS-HCC)    Class 2 obesity with body mass index (BMI) of 37.0 to 37.9 in adult, unspecified obesity type, unspecified whether serious comorbidity present    Assessment & Plan  Chronic low back pain with right-sided lumbar radiculopathy  Chronic low back pain with intermittent right-sided lumbar radiculopathy, exacerbated by prolonged sitting, standing, and riding. Pain occasionally radiates down the back of the right leg, stopping at the knee, with severity reaching 10/10. Current management includes oxycodone , lidocaine patches, and Voltaren  gel. Not a candidate at this time for injections due to thrombocytopenia. Physical therapy has not been pursued recently.  - Continue oxycodone , lidocaine patches, and Voltaren  gel for pain management. Pt has been seeing [facility] in [place] and has been given prescriptions to last at least through October. Will take over medication once pt needs a refill.  - Consider physical therapy for back pain management, strengthening and gait stability.  - Reassess candidacy for injections once platelet levels are stabilized.    Gait instability and recurrent falls  Gait instability with recent falls resulting in injuries, associated with dizziness and unsteadiness. High risk of bleeding due to thrombocytopenia increases concern for falls.  - Encourage consistent use of walker to prevent falls.  - Educate on fall prevention strategies and importance of using assistive devices.    Primary myelofibrosis with secondary thrombocytopenia  Primary myelofibrosis with secondary thrombocytopenia. Under care at a cancer center with plans for infusions to improve blood counts. Ongoing care coordination between cancer center and primary care provider.     Chronic, continued use of opioids. Discussed with the patient in details risks of continued opioid intake including opioid hyperalgesia, and opioid-induced adrenal insufficiency with low cortisol levels.   - Pt has been seeing [facility] in [place] and has been given prescriptions to last at least through October. Will take over medication and make any adjustments needed, when pt needs refill. Was previous rx'd Oxycodone  10/325mg  3-4 times daily from [facility].    Stage 3a CKD- No NSAIDS.     Obesity- will benefit from diet, exercise and weight loss; activity currently limited by pain. BMI 37.73          No follow-ups on file.     I have reviewed past medical, surgical, medication, allergy, social, and family histories today and updated them in Epic where appropriate.    Subjective:     Chief Compaint: Pain    HPI:   History of Present Illness  Emily Walter is a 73 year old female with myelofibrosis who presents with back pain and leg pain.    She experiences lower back pain that sometimes radiates down her right leg, stopping at the knee. The pain is severe, reaching a 9 out of 10 at its worst, and is exacerbated by prolonged sitting, standing, and riding in a vehicle. Currently, there is no back pain radiating down the legs, but past occurrences are noted.    She has a history of myelofibrosis and is under the care of a cancer center. She has previously required platelet and whole blood transfusions, particularly after kidney  stone surgery in March of last year. She is not currently receiving transfusions but is monitored by the cancer center.    She uses oxycodone  for pain management, originally prescribed for her back pain, along with lidocaine patches and Voltaren  gel. She has allergies to gabapentin, Lyrica, codeine, steroids, prednisone , and fentanyl.    She has experienced two recent falls, one resulting in a concussion, and reports dizziness and hearing loss in one ear. She uses a cane and has two walkers. No smoking, diabetes, or use of blood thinners. She has not undergone physical therapy recently, nor has she had back surgery or epidural injections.       PDMP Review Status:  I personally reviewed the PDMP which was remarkable for 56.7 Current MME/day     Imaging:  Impression   Moderate spinal canal stenosis at L4-L5 with narrowing of the lateral recess and impingement traversing bilateral L5 nerves, progressed since 2008.      Interval progression of neural foraminal narrowing at L4-L5, causing impingement of the exiting L4 nerves at this level.      Abnormal marrow signal consistent with myelofibrosis.     Narrative   EXAM: Magnetic resonance imaging, spinal canal and contents, lumbar, without contrast material.   DATE: 09/13/2020 4:53 PM   ACCESSION: [id number]   DICTATED: 09/23/2020 1:08 PM   INTERPRETATION LOCATION: [facility]      CLINICAL INDICATION: 73 years old Female with right sided sciatica ; Lumbar radiculopathy, > 6 wks  - M54.31 - Right sided sciatica        COMPARISON: MRI lumbar spine 05/21/2006.      TECHNIQUE: Multiplanar MRI was performed through the lumbar spine without intravenous contrast.      FINDINGS:   Diffuse T1 and T2 hypointense marrow signal consistent with myelofibrosis. The visualized cord is unremarkable and the conus medullaris ends at a normal level.      Dictated 1-2 anterolisthesis of L4 on L5. Disc desiccation with loss of disc height.      L1-L2: Disc bulge, ligamentum flavum hypertrophy and facet arthropathy. No spinal canal or neural foraminal narrowing.      L2-L3: Disc bulge with superimposed facet arthropathy, ligamentum flavum hypertrophy causes mild spinal canal narrowing. Disc contents contact the traversing L3 nerve bilaterally. No neural foraminal narrowing.      L3-L4: Disc bulge with superimposed facet arthropathy, ligamentum flavum hypertrophy causes mild spinal canal narrowing. Disc contents contact the traversing L4 nerves bilaterally.  No neural foraminal narrowing.      L4-L5: Retrolisthesis with superimposed disc bulge, ligamentum flavum hypertrophy and facet arthropathy causes moderate spinal canal narrowing. The disc narrows bilateral lateral recesses impinging the traversing bilateral L5 nerves. Severe bilateral neural foraminal narrowing with impingement of the exiting L4 nerves.      L5-S1: No herniation. No spinal canal narrowing. No neural foraminal narrowing.      The paraspinal tissues are within normal limits.      For the purposes of this dictation, the lowest well formed intervertebral disc space is assumed to be the L5-S1 level, and there are presumed to be five lumbar-type vertebral bodies.          Pain Intervention History:  None     Opioid Risk Screening Tools:    PEG Pain Total Score: 7.33    ROS:  Review of systems negative unless otherwise noted as per HPI.    Allergies:   Fentanyl, Codeine, Corticosteroids (glucocorticoids), Gabapentin, Prednisone , and Pregabalin  Medications: Current Medications[1]    Past Medical History[2]  Past Surgical History[3]  Family History[4]  Short Social History[5]    Objective:       10/19/23 0943   BP: 138/73   Pulse: 76   Resp: 20   Temp: 36.1 ??C (96.9 ??F)   SpO2: 97%   Weight: 96.6 kg (213 lb)   Height: 160 cm (5' 3)   PainSc: 6       Body mass index is 37.73 kg/m??.     Physical Exam:  Constitutional: Alert, no acute distress  HEENT: Normocephalic  Respiratory: Regular respiratory rate, non-labored breathing  Cardiovascular: Regular Rate. No focal peripheral edema in lower extremities  Musculoskeletal: Lumbar Spine with ROM restricted in flexion to 70 degrees, extension to 20 degrees. Focal TTP over lumbar paraspinal mm, no central bony TTP, stepoff, or deformity appreciated. SLR equivocal bilaterally. + pain with facet loading bilaterally.    Dermatologic: Skin warm and well perfused. No focal rash observed  Abdomen: non-distended  Neurologic: No focal motor or sensory deficits. Gait antalgic LE strength 4/5 B/L   Psychiatric: Mood normal. Affect appropriate    Please note that this dictation was completed with Training and development officer. Quite often unanticipated grammatical and other interpretative errors are inadvertently transcribed by the computer software. Please regard these errors. Please excuse any errors that have escaped final proofreading.    Camelia KATHEE Curl, FNP   10/19/2023   10:30 AM           [1]   Current Outpatient Medications:     albuterol  HFA 90 mcg/actuation inhaler, Inhale 2 puffs every six (6) hours as needed for wheezing or shortness of breath., Disp: 8 g, Rfl: 3    cholecalciferol , vitamin D3-50 mcg, 2,000 unit,, 50 mcg (2,000 unit) tablet, Take 1 tablet (50 mcg total) by mouth in the morning., Disp: , Rfl:     conjugated estrogens  (PREMARIN ) 0.625 mg/gram vaginal cream, Insert 0.5 g into the vagina Two (2) times a week., Disp: 30 g, Rfl: 1    diclofenac  sodium (VOLTAREN ) 1 % gel, Apply 4 g topically four (4) times a day as needed for arthritis or pain (Knee pain)., Disp: 100 g, Rfl: 2    fluticasone propionate (FLONASE) 50 mcg/actuation nasal spray, 2 sprays into each nostril daily as needed for allergies., Disp: , Rfl:     furosemide  (LASIX ) 40 MG tablet, Take 1 tablet (40 mg total) by mouth daily., Disp: 90 tablet, Rfl: 3    levocetirizine (XYZAL) 5 MG tablet, Take 1 tablet (5 mg total) by mouth every evening., Disp: 90 tablet, Rfl: 3    lidocaine (LIDODERM) 5 % patch, Place 1 patch on the skin daily. Apply to affected area for 12 hours only each day (then remove patch), Disp: , Rfl:     lovastatin  (MEVACOR ) 40 MG tablet, Take 1 tablet (40 mg total) by mouth daily., Disp: 90 tablet, Rfl: 1    miscellaneous medical supply Misc, , Disp: , Rfl:     momelotinib (OJJAARA ) 100 mg tablet, Take 1 tablet (100 mg total) by mouth daily. Swallow tablets whole; do not cut, crush, or chew., Disp: 30 tablet, Rfl: 2    NARCAN 4 mg/actuation nasal spray, 1 spray into alternating nostrils once as needed., Disp: , Rfl: 0    omeprazole  (PRILOSEC) 40 MG capsule, Take 1 capsule (40 mg total) by mouth daily., Disp: 90 capsule, Rfl: 3    ondansetron  (ZOFRAN -ODT) 4 MG disintegrating tablet,  Dissolve 1 tablet (4 mg total) in the mouth every eight (8) hours as needed for nausea., Disp: 15 tablet, Rfl: 3    oxyCODONE -acetaminophen (PERCOCET) 10-325 mg per tablet, Take 1 tablet by mouth every six (6) hours as needed for pain., Disp: , Rfl:     sertraline  (ZOLOFT ) 25 MG tablet, Take 1 tablet (25 mg total) by mouth daily., Disp: 90 tablet, Rfl: 3    spironolactone  (ALDACTONE ) 25 MG tablet, Take 1 tablet (25 mg total) by mouth daily., Disp: 90 tablet, Rfl: 2    ULTRA-LIGHT ROLLATOR Misc, Use as directed., Disp: , Rfl: 0    polyethylene glycol (GOLYTELY ) 236-22.74-6.74 gram solution, Take by mouth as directed per [facility] GI prep instructions, for split bowel prep. (Patient not taking: No sig reported), Disp: 4000 mL, Rfl: 0  [2]   Past Medical History:  Diagnosis Date    Anxiety     Arthritis     Asthma (HHS-HCC)     Cancer    (CMS-HCC)     Cataract     Chronic kidney disease     Depression     Difficult intravenous access     GERD (gastroesophageal reflux disease)     Heart failure    (CMS-HCC)     HLD (hyperlipidemia)     Hypertension     Myelofibrosis    (CMS-HCC)     Nephrolithiasis     Sleep apnea     Tinnitus    [3]   Past Surgical History:  Procedure Laterality Date    HYSTERECTOMY      PR CYSTO/URETERO W/LITHOTRIPSY &INDWELL STENT INSRT Left 08/21/2018    Procedure: priority CYSTOURETHROSCOPY, WITH URETEROSCOPY AND/OR PYELOSCOPY; WITH LITHOTRIPSY INCLUDING INSERTION OF INDWELLING URETERAL STENT;  Surgeon: Nicholaus Mt Viprakasit, MD;  Location: CYSTO PROCEDURE SUITES [facility];  Service: Urology    PR CYSTO/URETERO/PYELOSCOPY, CALCULUS TX Left 09/04/2018    Procedure: CYSTOURETHROSCOPY, W/URETEROSCOPY &/OR PYELOSCOPY; W/REMOVAL/MANIPULATION CALCULUS(URETERAL CATH INCLUDED);  Surgeon: Nicholaus Mt Viprakasit, MD;  Location: CYSTO PROCEDURE SUITES [facility];  Service: Urology    PR CYSTOSCOPY,INSERT URETERAL STENT Left 05/04/2022    Procedure: CYSTOURETHROSCOPY,  WITH INSERTION OF INDWELLING URETERAL STENT (EG, GIBBONS OR DOUBLE-J TYPE);  Surgeon: Drena Darryle Gull, MD;  Location: MAIN OR [facility];  Service: Urology    PR CYSTOSCOPY,REMV CALCULUS,SIMPLE Left 09/04/2018    Procedure: CYSTOURETHROSCOPY, WITH REMOVAL OF FOREIGN BODY, CALCULUS OR URETERAL STENT FROM URETHRA OR BLADDER; SIMPLE;  Surgeon: Nicholaus Mt Viprakasit, MD;  Location: CYSTO PROCEDURE SUITES [facility];  Service: Urology    PR CYSTOURETHROSCOPY,URETER CATHETER Left 08/21/2018    Procedure: CYSTOURETHROSCOPY, W/URETERAL CATHETERIZATION, W/WO IRRIG, INSTILL, OR URETEROPYELOG, EXCLUS OF RADIOLG SVC;  Surgeon: Nicholaus Mt Viprakasit, MD;  Location: CYSTO PROCEDURE SUITES [facility];  Service: Urology    PR CYSTOURETHROSCOPY,URETER CATHETER Left 09/04/2018    Procedure: CYSTOURETHROSCOPY, W/URETERAL CATHETERIZATION, W/WO IRRIG, INSTILL, OR URETEROPYELOG, EXCLUS OF RADIOLG SVC;  Surgeon: Nicholaus Mt Viprakasit, MD;  Location: CYSTO PROCEDURE SUITES ;  Service: Urology    PR PERQ DILATION XST TRC NEW ACCESS RENAL COLTJ SYS Left 04/24/2022    Procedure: DILATION OF EXISTING TRACT, PERC, FOR ENDOUROLOGIC PROC INCL IMAGING GUIDANCE & RADIOLOGIC SUPERVISION & INTERPRET, W POSTPROC TUBE PLACEMENT, WHEN PERF; INCL NEW ACCESS INTO RENAL COLLECTING SYSTEM;  Surgeon: Mitch Alm Pickles, MD;  Location: [facility] OR [facility];  Service: Urology    PR PERQ NL/PL LITHOTRP COMPLEX >2 CM MLT LOCATIONS Left 04/24/2022    Procedure: standard PCNL prone vs. standard; need fortec;  Surgeon:  Mitch Alm Pickles, MD;  Location: [facility] OR [facility];  Service: Urology    PR REMOVE BLADDER STONE,<2.5 CM N/A 08/21/2018    Procedure: LITHOLAPAXY: CRUSH/FRAGMENT CALCULUS, BY ANY MEANS IN BLADDER & REMOVAL OF FRAGMENTS; SIMPLE/SMALL <2.5 CM;  Surgeon: Nicholaus Mt Viprakasit, MD;  Location: CYSTO PROCEDURE SUITES [facility];  Service: Urology    SALPINGOOPHORECTOMY Right     12/07/2010    VAGINAL HYSTERECTOMY      12/07/2010   [4]   Family History  Problem Relation Age of Onset    Hypertension Mother     Cancer Brother         bone    Hypertension Brother     Hypertension Brother     Hypertension Brother     Anesthesia problems Neg Hx     Bleeding Disorder Neg Hx     Breast cancer Neg Hx     Ovarian cancer Neg Hx     Colon cancer Neg Hx     Endometrial cancer Neg Hx    [5]   Social History  Tobacco Use    Smoking status: Never    Smokeless tobacco: Never   Vaping Use    Vaping status: Never Used   Substance Use Topics    Alcohol use: No    Drug use: No

## 2023-10-24 DIAGNOSIS — D7581 Myelofibrosis: Principal | ICD-10-CM

## 2023-10-24 NOTE — Unmapped (Signed)
 [facility] Clinical Assessment & Refill Coordination Note    Emily Walter, DOB: 01-23-1951  Phone: [phone] (home) [phone] (work)    All above HIPAA information was verified with patient.     Was a Nurse, learning disability used for this call? No    Specialty Medication(s):   Hematology/Oncology: Ojjaara      Current Medications[1]     Changes to medications: Destiny reports no changes at this time.    Medication list has been reviewed and updated in Epic: Yes    Allergies[2]    Changes to allergies: No    Allergies have been reviewed and updated in Epic: Yes    SPECIALTY MEDICATION ADHERENCE     Ojjaara  100 mg: 10 days of medicine on hand       Medication Adherence    Patient reported X missed doses in the last month: 0  Specialty Medication: Ojjaara  100 mg  Patient is on additional specialty medications: No  Informant: patient          Specialty medication(s) dose(s) confirmed: Regimen is correct and unchanged.     Are there any concerns with adherence? No    Adherence counseling provided? Yes, see clinical intervention documentation    CLINICAL MANAGEMENT AND INTERVENTION      Clinical Benefit Assessment:    Do you feel the medicine is effective or helping your condition? Patient declined to answer    Clinical Benefit counseling provided? Not needed    Adverse Effects Assessment:    Are you experiencing any side effects? No    Are you experiencing difficulty administering your medicine? No    Quality of Life Assessment:    Quality of Life    Rheumatology  Oncology  Dermatology  Cystic Fibrosis          How many days over the past month did your condition  keep you from your normal activities? For example, brushing your teeth or getting up in the morning. 0    Have you discussed this with your provider? Not needed    Acute Infection Status:    Acute infections noted within Epic:  No active infections    Patient reported infection: None    Therapy Appropriateness:    Is therapy appropriate based on current medication list, adverse reactions, adherence, clinical benefit and progress toward achieving therapeutic goals? Yes, therapy is appropriate and should be continued     Clinical Intervention:    Was an intervention completed as part of this clinical assessment? No    DISEASE/MEDICATION-SPECIFIC INFORMATION      N/A    Oncology: Is the patient receiving adequate infection prevention treatment? Not applicable  Does the patient have adequate nutritional support? Not applicable    PATIENT SPECIFIC NEEDS     Does the patient have any physical, cognitive, or cultural barriers? No    Is the patient high risk? No    Does the patient require physician intervention or other additional services (i.e., nutrition, smoking cessation, social work)? No    Does the patient have an additional or emergency contact listed in their chart? Yes    SOCIAL DETERMINANTS OF HEALTH     At the [facility], we have learned that life circumstances - like trouble affording food, housing, utilities, or transportation can affect the health of many of our patients.   That is why we wanted to ask: are you currently experiencing any life circumstances that are negatively impacting your health and/or quality  of life? No    Social Drivers of Health     Food Insecurity: No Food Insecurity (05/17/2023)    Hunger Vital Sign     Worried About Running Out of Food in the Last Year: Never true     Ran Out of Food in the Last Year: Never true   Tobacco Use: Low Risk  (10/19/2023)    Patient History     Smoking Tobacco Use: Never     Smokeless Tobacco Use: Never     Passive Exposure: Not on file   Transportation Needs: No Transportation Needs (05/17/2023)    PRAPARE - Transportation     Lack of Transportation (Medical): No     Lack of Transportation (Non-Medical): No   Alcohol Use: Not At Risk (08/09/2023)    Alcohol Use     How often do you have a drink containing alcohol?: Never     How many drinks containing alcohol do you have on a typical day when you are drinking?: Not on file     How often do you have 5 or more drinks on one occasion?: Never   Housing: Low Risk  (05/17/2023)    Housing     Within the past 12 months, have you ever stayed: outside, in a car, in a tent, in an overnight shelter, or temporarily in someone else's home (i.e. couch-surfing)?: No     Are you worried about losing your housing?: No   Physical Activity: Not on file   Utilities: Low Risk  (05/17/2023)    Utilities     Within the past 12 months, have you been unable to get utilities (heat, electricity) when it was really needed?: No   Stress: No Stress Concern Present (08/09/2023)    [employer] of Occupational Health - Occupational Stress Questionnaire     Feeling of Stress: Not at all   Interpersonal Safety: Not At Risk (08/21/2023)    Interpersonal Safety     Unsafe Where You Currently Live: No     Physically Hurt by Anyone: No     Abused by Anyone: No   Substance Use: Low Risk  (08/09/2023)    Substance Use     In the past year, how often have you used prescription drugs for non-medical reasons?: Never     In the past year, how often have you used illegal drugs?: Never     In the past year, have you used any substance for non-medical reasons?: No   Intimate Partner Violence: Not At Risk (08/21/2023)    Humiliation, Afraid, Rape, and Kick questionnaire     Fear of Current or Ex-Partner: No     Emotionally Abused: No     Physically Abused: No     Sexually Abused: No   Social Connections: Not on file   Financial Resource Strain: Low Risk  (04/25/2022)    Overall Financial Resource Strain (CARDIA)     Difficulty of Paying Living Expenses: Not hard at all   Health Literacy: Low Risk  (08/09/2023)    Health Literacy     : Never   Internet Connectivity: No Internet connectivity concern identified (09/08/2022)    Internet Connectivity     Do you have access to internet services: Yes     How do you connect to the internet: Personal Device at home     Is your internet connection strong enough for you to watch video on your device without major problems?: Yes     Do  you have enough data to get through the month?: Yes     Does at least one of the devices have a camera that you can use for video chat?: Yes       Would you be willing to receive help with any of the needs that you have identified today? Not applicable       SHIPPING     Specialty Medication(s) to be Shipped:   Hematology/Oncology: Ojjaara     Other medication(s) to be shipped: No additional medications requested for fill at this time    Specialty Medications not needed at this time: N/A     Changes to insurance: No    Cost and Payment: Patient has a $0 copay, payment information is not required.    Delivery Scheduled: Yes, Expected medication delivery date: 10/26/23.     Medication will be delivered via UPS to the confirmed prescription address in [facility].    The patient will receive a drug information handout for each medication shipped and additional FDA Medication Guides as required.  Verified that patient has previously received a Conservation officer, historic buildings and a Surveyor, mining.    The patient or caregiver noted above participated in the development of this care plan and knows that they can request review of or adjustments to the care plan at any time.      All of the patient's questions and concerns have been addressed.    Rian Koon CHRISTELLA Molt, PharmD   [facility] Specialty Pharmacist       [1]   Current Outpatient Medications   Medication Sig Dispense Refill    albuterol  HFA 90 mcg/actuation inhaler Inhale 2 puffs every six (6) hours as needed for wheezing or shortness of breath. 8 g 3    cholecalciferol , vitamin D3-50 mcg, 2,000 unit,, 50 mcg (2,000 unit) tablet Take 1 tablet (50 mcg total) by mouth in the morning.      conjugated estrogens  (PREMARIN ) 0.625 mg/gram vaginal cream Insert 0.5 g into the vagina Two (2) times a week. 30 g 1    diclofenac  sodium (VOLTAREN ) 1 % gel Apply 4 g topically four (4) times a day as needed for arthritis or pain (Knee pain). 100 g 2    fluticasone propionate (FLONASE) 50 mcg/actuation nasal spray 2 sprays into each nostril daily as needed for allergies.      furosemide  (LASIX ) 40 MG tablet Take 1 tablet (40 mg total) by mouth daily. 90 tablet 3    levocetirizine (XYZAL) 5 MG tablet Take 1 tablet (5 mg total) by mouth every evening. 90 tablet 3    lidocaine (LIDODERM) 5 % patch Place 1 patch on the skin daily. Apply to affected area for 12 hours only each day (then remove patch)      lovastatin  (MEVACOR ) 40 MG tablet Take 1 tablet (40 mg total) by mouth daily. 90 tablet 1    miscellaneous medical supply Misc       momelotinib (OJJAARA ) 100 mg tablet Take 1 tablet (100 mg total) by mouth daily. Swallow tablets whole; do not cut, crush, or chew. 30 tablet 2    NARCAN 4 mg/actuation nasal spray 1 spray into alternating nostrils once as needed.  0    omeprazole  (PRILOSEC) 40 MG capsule Take 1 capsule (40 mg total) by mouth daily. 90 capsule 3    ondansetron  (ZOFRAN -ODT) 4 MG disintegrating tablet Dissolve 1 tablet (4 mg total) in the mouth every eight (8) hours as needed  for nausea. 15 tablet 3    oxyCODONE -acetaminophen (PERCOCET) 10-325 mg per tablet Take 1 tablet by mouth every six (6) hours as needed for pain.      polyethylene glycol (GOLYTELY ) 236-22.74-6.74 gram solution Take by mouth as directed per [facility] GI prep instructions, for split bowel prep. (Patient not taking: No sig reported) 4000 mL 0    sertraline  (ZOLOFT ) 25 MG tablet Take 1 tablet (25 mg total) by mouth daily. 90 tablet 3    spironolactone  (ALDACTONE ) 25 MG tablet Take 1 tablet (25 mg total) by mouth daily. 90 tablet 2    ULTRA-LIGHT ROLLATOR Misc Use as directed.  0     No current facility-administered medications for this visit.   [2]   Allergies  Allergen Reactions    Fentanyl Anaphylaxis    Codeine Other (See Comments)     Headache, nausea    Corticosteroids (Glucocorticoids) Headache     **ALL STEROIDS**    Gabapentin Other (See Comments)     oversedation    Other Reaction(s): Other (see comments)    oversedation      Other reaction(s): Other (See Comments)   oversedation   oversedation   oversedation    Prednisone  Headache    Pregabalin Rash     Swelling and general fatigue    Other Reaction(s): Other (see comments)    Swelling and general fatigue   Other reaction(s): Other (see comments)   Swelling and general fatigue   Swelling and general fatigue

## 2023-10-25 MED FILL — OJJAARA 100 MG TABLET: ORAL | 30 days supply | Qty: 30 | Fill #1

## 2023-10-25 NOTE — Unmapped (Signed)
 Otolaryngology Return Visit    HPI:  Emily Walter is a 73 y.o. female is seen in consultation at the request of [name], MD, for evaluation of tinnitus. Roughly 1 month ago she had the onset of hearing loss and tinnitus on the left side.  This was associated with a 'swimmy headed' and off balance sensation. She was prescribed meclizine  and MRI was ordered.  She was referred here for further evaluation.     She states that about 3 weeks ago she had the acute onset of dizziness as well as decreased hearing.  She had issues with nasal congestion about a month ago.  No GI symptoms.  She first noticed her hearing loss as she held her cell phone after that side and could not hear anything.  She feels like her hearing has not changed over the past 3 weeks.  She has continued issues with dizziness.  She took meclizine  but this actually made her symptoms worse.  She has tinnitus on that left side, high-pitched noise.  Denies any previous issues with otalgia, otorrhea, ear infections.  No previous ear surgery.     She denies history of diabetes.  She has previously taken steroids for COPD.  She has a allergy alert to prednisone  in her chart, but she states that the last time she took this she did well with no issue.     04/18/22- Ended up discontinuing prednisone  due to headaches, throat hurting, dizziness.  No changes in hearing.  Continued issues with imbalance.     05/02/22- Last visit had IT dex injection.  Since last seen she had her kidney procedure and has had some issues recovering from this.  She denies any issues with her ears, and denies any significant changes in her hearing.    08/02/22- Presents today with repeat hearing test. No recent issues with pain or drainage in ears. Hearing stable since last visit.  Still ongoing dizziness, worsened with position changes. She is scheduled to see Vestibular Therapy on July 1st.     10/25/23: This patient previously saw Dr. Sebastian, here today for issues with dizziness.   History of Present Illness  She has been experiencing dizziness characterized by a sensation of unsteadiness since her sudden hearing loss in her left ear in March 2024. The dizziness has not improved and is exacerbated by exposure to sunlight. No issue with room spinning, vertigo or lightheaded sensations. She uses a cane for stability when walking and requires assistance with activities such as showering to prevent falls. She has experienced multiple falls, resulting in significant bruising, and is unable to bend over without falling. She was previously referred to balance therapy at [facility] in [place], where she attended three to four sessions without improvement.    She has a history of migraines which resolved after starting blood pressure medication in her forties.  Her current medications include Ojjaara  for myelofibrosis, which she started approximately five to six months ago. She has a history of myelofibrosis, an enlarged spleen, and low platelet counts, for which she receives regular monitoring and treatment at a cancer center.    She is in the process of moving from [facility] to [place] to be closer to family.    Exam:  General: well appearing, stated age, no distress   Head - atraumatic, normocephalic   Nose: dorsum midline, no rhinorrhea  Neck: symmetric, trachea midline  Psychiatric: alert and oriented, appropriate mood and affect   Respiratory: no audible wheezing or stridor,  normal work of breathing  Neurologic - cranial nerves 2-12 grossly intact  Facial Strength - HB 1/6 bilaterally    Ears - External ear- normal, no lesions, no malformations   Otoscopy - R: EAC clear, TM intact, ME clear; L: EAC clear, TM intact, ME clear  Tuning fork test - Weber NA    Audiogram: The audiogram (10/29/23) was personally reviewed and interpreted. This demonstrates mild sloping to moderately-severe in the right ear and moderately-severe precipitously dropping to profound asymmetrical sensorineural hearing loss in the left ear.             Tympanogram: The tympanogram was personally reviewed and interpreted. This demonstrates type A in the left ear.      Assessment/Plan:  Emily Walter is a 73 y.o. female with a history of left-sided sudden sensorineural hearing loss and dizziness since February 2024, here today for issues with dizziness and hearing loss. She has seen vestibular therapy in [place] without significant improvement. They are interested in referral to [facility] Vestibular Therapy, and this was provided. We also discussed that Ojjaara 's potential side effects include dizziness, and to discuss this with her prescribing provider. Other possible differential includes vestibular migraine, given the history of migraine and worsening dizziness with exposure to bright lights exposure.     We reviewed her Audiogram from today, which shows mild sloping to moderately-severe in the right ear and moderately-severe, precipitously dropping to profound asymmetrical sensorineural hearing loss in the left ear. For her hearing options, discussed observation, BiCROS, bone conduction hearing aids, or a cochlear implant. Given her health history, the patient is not interested in surgical options, so I will refer her for BiCROS evaluation. I will plan to see her back in 6 months with AOA to reassess.     The patient/family voiced understanding of the plan as detailed above and is in agreement. I appreciate the opportunity to participate in her care.    I attest to the above information and documentation. However, this note has been created using voice recognition software and may have errors that were not dictated and not seen in editing.

## 2023-10-29 ENCOUNTER — Ambulatory Visit
Admit: 2023-10-29 | Discharge: 2023-10-30 | Payer: Medicare (Managed Care) | Attending: Audiologist | Primary: Audiologist

## 2023-10-29 DIAGNOSIS — R42 Dizziness and giddiness: Principal | ICD-10-CM

## 2023-10-29 DIAGNOSIS — H918X3 Other specified hearing loss, bilateral: Principal | ICD-10-CM

## 2023-10-29 DIAGNOSIS — H9122 Sudden idiopathic hearing loss, left ear: Principal | ICD-10-CM

## 2023-10-29 DIAGNOSIS — H903 Sensorineural hearing loss, bilateral: Principal | ICD-10-CM

## 2023-10-29 NOTE — Unmapped (Signed)
 [facility]  Audiology at [facility]    AUDIOLOGIC EVALUATION REPORT     Patient: Emily Walter, Emily Walter  MRN: [id number]  DOB: [birth year]  DATE OF EVALUATION: 10/29/2023    [place]: Please see Audiogram with full report under MEDIA tab  HISTORY     Elody Kleinsasser is a 73 y.o. individual seen by Audiology for a hearing evaluation as part of [facility], FNP's ENT clinic. Her medical history is significant for profound SSNHL in the left ear in 2024 for which she was seen by Dr. Karleen Hummer. Today, patient reports no new problems for her hearing, but wants to address dizziness. She reports no ringing or buzzing.    RESULTS     Otoscopy revealed:  Right Ear: clear external auditory canal  Left Ear: clear external auditory canal    Tympanometry using a 226 Hz probe tone was consistent with:  Right Ear: Type A tympanogram, consistent with normal middle ear pressure, compliance, and volume  Left Ear: Type A tympanogram, consistent with normal middle ear pressure, compliance, and volume    Today's behavioral evaluation was completed using conventional audiometry via insert earphones with good reliability.     Right Ear: Borderline normal hearing sloping to a mild-moderate SNHL  Speech Recognition Threshold (SRT): 25 dB HL  Word Recognition Testing: 65% at 100 dB HL using recorded NU-6 word list.    Left Ear: Severe to profound SNHL  Speech Recognition Threshold (SRT): NR 110 dB HL  Word Recognition Testing: 0%     Patient was counseled on today's results and expressed understanding.     RECOMMENDATIONS     Re-evaluate hearing per ENT request, sooner should concerns arise    Barnie Crazier, Au.D.    Access your Audiogram via MyChart:  - Log into myChart: Menu > Document Center > Medical Record Requests  - Click the hyperlink 'Request Medical Records'   - Fill out fields + Select Audiograms option under 'Specific Diagnostic Images' > Submit    Charges associated with this visit:  HC TYMPANOMETRY  HC COMP AUDIO EVAL & SPEECH RECOG

## 2023-11-05 ENCOUNTER — Ambulatory Visit: Admit: 2023-11-05 | Discharge: 2023-11-06 | Payer: Medicare (Managed Care)

## 2023-11-05 DIAGNOSIS — D7581 Myelofibrosis: Principal | ICD-10-CM

## 2023-11-05 DIAGNOSIS — D696 Thrombocytopenia, unspecified: Principal | ICD-10-CM

## 2023-11-05 LAB — CBC W/ AUTO DIFF
BASOPHILS ABSOLUTE COUNT: 0.6 10*9/L — ABNORMAL HIGH (ref 0.0–0.4)
BASOPHILS RELATIVE PERCENT: 3.4 %
EOSINOPHILS ABSOLUTE COUNT: 1.3 10*9/L — ABNORMAL HIGH (ref 0.0–0.7)
EOSINOPHILS RELATIVE PERCENT: 7.1 %
HEMATOCRIT: 37 % (ref 37.0–47.0)
HEMOGLOBIN: 10.7 g/dL — ABNORMAL LOW (ref 12.0–16.0)
IMMATURE GRANULOCYTES ABSOLUTE COUNT: 3.5 10*9/L — ABNORMAL HIGH (ref 0.0–0.6)
IMMATURE GRANULOCYTES RELATIVE PERCENT: 18.9 %
IMMATURE PLATELET FRACTION: 18 % — ABNORMAL HIGH (ref 1.0–7.0)
LYMPHOCYTES ABSOLUTE COUNT: 1.6 10*9/L (ref 1.2–3.4)
LYMPHOCYTES RELATIVE PERCENT: 8.9 %
MEAN CORPUSCULAR HEMOGLOBIN CONC: 28.9 g/dL — ABNORMAL LOW (ref 31.0–35.5)
MEAN CORPUSCULAR HEMOGLOBIN: 26.4 pg (ref 26.0–34.0)
MEAN CORPUSCULAR VOLUME: 91.1 fL (ref 81.0–102.0)
MONOCYTES ABSOLUTE COUNT: 0.9 10*9/L — ABNORMAL HIGH (ref 0.1–0.5)
MONOCYTES RELATIVE PERCENT: 4.6 %
NEUTROPHILS ABSOLUTE COUNT: 10.5 10*9/L — ABNORMAL HIGH (ref 1.4–6.5)
NEUTROPHILS RELATIVE PERCENT: 57.1 %
NUCLEATED RED BLOOD CELLS: 5 /100{WBCs} — ABNORMAL HIGH (ref 0–2)
PLATELET COUNT: 11 10*9/L — CL (ref 130–400)
RED BLOOD CELL COUNT: 4.06 10*12/L — ABNORMAL LOW (ref 4.20–5.40)
RED CELL DISTRIBUTION WIDTH: 20 % — ABNORMAL HIGH (ref 11.5–15.5)
WBC ADJUSTED: 18.3 10*9/L — ABNORMAL HIGH (ref 4.8–10.8)

## 2023-11-05 LAB — MANUAL DIFFERENTIAL
BANDS - REL (DIFF): 10 %
BLASTS - REL (DIFF): 1 %
EOSINOPHILS - ABS (DIFF): 1.6 10*9/L — ABNORMAL HIGH (ref 0.0–0.7)
EOSINOPHILS - REL (DIFF): 9 %
LYMPHOCYTES - ABS (DIFF): 0.9 10*9/L — ABNORMAL LOW (ref 1.2–3.4)
LYMPHOCYTES - REL (DIFF): 5 %
METAMYELOCYTES-REL (DIFF): 9 %
MONOCYTES - ABS (DIFF): 0.2 10*9/L (ref 0.1–0.5)
MONOCYTES - REL (DIFF): 1 %
NEUTROPHILS - ABS (DIFF): 13.7 10*9/L — ABNORMAL HIGH (ref 1.4–6.5)
NEUTROPHILS - REL (DIFF): 65 %

## 2023-11-05 LAB — COMPREHENSIVE METABOLIC PANEL
ALBUMIN/GLOBULIN RATIO: 1.3
ALBUMIN: 4 g/dL (ref 3.1–4.7)
ALKALINE PHOSPHATASE: 253 U/L — ABNORMAL HIGH (ref 33–120)
ALT (SGPT): 139 U/L — ABNORMAL HIGH (ref 3–33)
ANION GAP: 4 mmol/L — ABNORMAL LOW (ref 5–16)
AST (SGOT): 124 U/L — ABNORMAL HIGH (ref 10–40)
BILIRUBIN TOTAL: 3.9 mg/dL — ABNORMAL HIGH (ref 0.2–1.2)
BLOOD UREA NITROGEN: 22 mg/dL — ABNORMAL HIGH (ref 8–21)
BUN / CREAT RATIO: 14 (ref 6–20)
CALCIUM: 8.5 mg/dL (ref 8.5–11.0)
CHLORIDE: 101 mmol/L (ref 98–110)
CO2: 35 mmol/L — ABNORMAL HIGH (ref 24.0–31.0)
CREATININE: 1.6 mg/dL — ABNORMAL HIGH (ref 0.50–1.40)
EGFR CKD-EPI (2021) FEMALE: 33 mL/min/1.73m2 — ABNORMAL LOW (ref >=60)
GLOBULIN, TOTAL: 3.1 g/dL
GLUCOSE RANDOM: 105 mg/dL (ref 75–110)
OSMOLALITY CALCULATED: 283 mosm/kg
POTASSIUM: 4.1 mmol/L (ref 3.5–5.3)
PROTEIN TOTAL: 7.1 g/dL (ref 6.0–8.0)
SODIUM: 140 mmol/L (ref 135–153)

## 2023-11-05 LAB — LACTATE DEHYDROGENASE: LACTATE DEHYDROGENASE: 629 U/L — ABNORMAL HIGH (ref 100–220)

## 2023-11-05 MED ORDER — HYDROXYUREA 500 MG CAPSULE
ORAL_CAPSULE | ORAL | 1 refills | 70.00000 days | Status: CP
Start: 2023-11-05 — End: 2024-11-04

## 2023-11-05 NOTE — Unmapped (Signed)
 CBC      [facility]  Hematology Oncology Consultation    DATE OF SERVICE  11/05/2023     REFERRING PROVIDER   Pcp, None Per Patient  [address]  [place],  [place] [address]    PRIMARY CARE PROVIDER  Emily Emmie Dais, MD  [address] [place] [address]  [place] [address]    CONSULTING PROVIDER  Emily JAYSON Perla, MD   Hematology/Oncology    REASON FOR CONSULTATION  Management of myelofibrosis    CANCER HISTORY  Hematology/Oncology History   MDS/MPN (myelodysplastic/myeloproliferative neoplasms)    (CMS-HCC)   08/03/2016 Initial Diagnosis    PRIMARY MYELOFIBROSIS vs MDS/MPN with fibrosis    Prior labs show:  2014 at [facility]: normal CBC, WBC 6.5, Hgb 15.2, Plts 181.    CBC from 08/03/2016: WBC 10.3 x 10^9/L, Hgb 12.3 g/dL, MCV 85, plt 52 x 89^0/O.    Due to the cytopenias, Emily Walter was sent to Emily Walter and underwent a bone marrow biopsy. I currently do not have bone marrow slides to review.  Outside [facility] report from 08/03/2016 states,  Hypercellular bone marrow (85%) with hyperplastic hematopoiesis.  Atypical megakaryocyte hyperplasia with focal clustering.  Abnormal localization of immature myeloid precursors.  No significant increase in blasts.  Mild to moderate reticulin fibrosis.    Driver mutation = SRSF2 (2018)  Karyotype = 46,XX,add(4)(q33),add(17)(q21)[13]     Higher-risk disease, with thrombocytopenia, SRSF2 mutation.      She was not an optimal alloHSCT candidate due to multiple co-morbidities.      She trialed ruxolitinib  but did not have a good response.     07/14/2022:  Received Infed  1g IV for iron  deficiency.     02/21/2023 Progression    MYELODYSPLASTIC PROGRESSION     She has been on observation alone for several years.  She now has progressive cytopenias, with platelets trending down to teens from 40-50's in April 2024 and hgb trending down from 11-12 g/dL in Feb/March 2024 to 9.9 g/dL now.  WBC has progressively increased, was 10 in 05/2022 and now consistently >15.  Myeloid mutation panel with persistent SRSF2 mutation and new ASXL1 mutation with VAF 5%. In this context, a bone marrow biopsy was performed at [facility] on 02/21/23.  This showed persistent myelofibrosis with dysplastic features, consistent with MDS/MPN or perhaps MDS with myelofibrosis.      Karyotype is pending, but prior karyotype 46,XX with add(4) and add(17).       The bone marrow now appears most consistent with MDS.    For prognostic calculations, I used labs from 02/21/23: ANC 15, hgb 9.9, plts 18, bm blasts 2%, age 55yo, intermediate karyotype, SRSF2, ASXL1.      IPSS:  Int-1 (1 pt)  IPSS-R: Intermediate. (4pt) 65yr median overall survival.  25% AML transformation 3.2 yrs.   IPSS-M: Moderate-High. Leukemia-free survival 2.3 yrs. Overall survival 2.8 yrs.      03/11/2023 -  Chemotherapy    MOMELOTINIB    Indication = primary myelofibrosis with anemia   Starting dose = 100mg  po daily (reduced for platelet count 18)    03/12/23: WBC 24.7, Hgb 10, Plts 18. sCr 1.34.   04/09/23: WBC 26.6, Hgb 10.6, Plts 17.  sCr 1.34. 4% myelocytes, 4% metamyelocytes.   05/10/23: WBC 21.8, hgb 11.6, plt 18  06/11/23: Patient stopped momelotinib without team's knowledge due to what she considered new side effects  06/18/23: WBC 24.4, hgb 10.4, plt 13, sCr  1.46         CANCER STAGING   Cancer Staging   No matching staging information was found for the patient.      SUMMARY/ASSESSMENT    73 y.o. female with CHF, COPD, stage IIIa chronic kidney disease, lumbar stenosis with myelofibrosis    RECOMMENDATION/PLAN    Myelofibrosis-   07/24/2016  BM bx- marrow 80 to 90% cellular with trilineage hematopoiesis, increased megakaryocytes.    Reticulin fibrosis increased (MF 1-2)  Cytogenetics-46,XX,add(4)(q33),add(17)(q21)[13]   Myeloid mutation panel-SRSF2: c.284C>T (p.Pro95Leu).  Associated with poor prognosis in MDS     12/04/2016:  Formally diagnosed with myelofibrosis.    DIPSS-plus:  Intermediate 2 risk, corresponding to median survival of 2.9 years  MIPPS70-plus:  High-risk, given the SRSF2 and the high-risk cytogenetics. Median survival 3.9 yrs   12/22/2016:  Discussion of allogeneic stem cell trasnplant, but declined further consideration   04/23/2017 through 08/2017:  Ruxolitinib .  Stopped bc of worsening DOE. Fedratinib considered to help with splenomegaly. Placed on observation     01/23/2023:  Myeloid mutation panel  Pathogenic   ASXL1 c.2077C>T p.(Arg693*)VAF: 5.3%   Pathogenic SRSF2 c.284C>T p.(Pro95Leu)       02/21/2023:  BM bx- cellularity 90%  with disorganized trilineage hematopoiesis, megakaryocytic dysplasia (2% blasts by manual touch prep differential and <5% CD34-positive blasts by immunohistochemical analysis)    Reticulin and trichrome stains reveal a patchy dense increase in reticulin fibrosis and no significant collagen fibrosis. (MF1-2).    Cytogenetics:  46,XX,add(4)(q33),add(17)(q21)[20]     WBC 19.3 hemoglobin 9.9 platelet 18; 84 segs 9 lymphs 3 monos 3 eos  Primary myelofibrosis, intermediate-2 by DIPSS-Plus, High-risk by MIPSS70-Plus - with dysplastic progression      Emergence of new mutation, dysplasia, increasing fibrosis coupled with increasing spleen size concerning.      03/11/2023:  Began Momelotinib 100 mg daily due to clinical, molecular and radiographic progression.    06/18/2023:  Abdominal U/S- Splenomegaly with largest diameter measuring 17.3 cm and calculated volume of 1147 mL      09/13/2023: CT AP- Spleen increased in size when compared to 01/14/2023, now 21 cm.   Deep inguinal and iliac chain lymphadenopathy     Therapeutics  10/05/2023:  Patient has numerous comorbidities which limit therapeutic options.  She is not a candidate for allo-transplant.   Treatment is therefore palliative.   Remains on Momelotinib- however she is having continued issues with fatigue, LUQ abdominal pain, cytopenias and worsening splenomegaly.      11/05/2023:  Beginning Hydrea 500 mg on MWF for cytoreduction- patient has symptomatic splenomegaly and cytopenias.      HISTORY OF PRESENT ILLNESS     Emily Walter is a 73 y.o. female with CHF, COPD, stage IIIa chronic kidney disease, lumbar stenosis    05/23/2015:  WBC 13.6 Hgb 13.4 PLT 105.  Chemistries notable for creatinine 1.15 glucose 125  08/03/2016: Bone marrow biopsy-hypercellular marrow (80 to 90%) with trilineage hematopoiesis with increased megakaryocytes.  Reticulin fibrosis increased (MF 1-2)  Cytogenetics-46,XX,add(4)(q33),add(17)(q21)[13] [This is an incomplete study given only 13 metaphases were obtained from this patient's sample instead of 20]   Myeloid mutation panel-SRSF2: c.284C>T (p.Pro95Leu).  This is a missense mutation, and in MDS/MPN overlap syndromes and associated with poor prognosis in MDS    10/23/2016: WBC 10.3 hemoglobin 12.2 platelet 67; ANC 8.1 ALC 1.5.  EPO 83  CMP notable for creatinine 1.06  Ferritin 34.1    12/04/2016:  Formally diagnosed with myelofibrosis.    DIPSS-plus:  Intermediate  2 risk, corresponding to median survival of 2.9 years  MIPPS70-plus:  High-risk, given the SRSF2 and the high-risk cytogenetics. Median overall survival 3.9 years in the [facility] cohort    12/22/2016:  Discussion of allogeneic stem cell trasnplant, but declined further consideration   04/23/2017 through 08/2017:  Treated with Ruxolitinib .  Stopped because of worsening DOE. Fedratinib discussed as this might help splenomegaly.      01/14/2018: Splenic ultrasound-spleen size 14.6 cm    04/26/2022:  Cardiac ECHO- LVEF 60-65%.  LV with normal wall thickness, normal diastolic function.  PAP  22 mm Hg.      01/23/2023:  Myeloid mutation panel     Contains abnormal data ASXL1 ( Pathogenic )   ASXL1 c.2077C>T p.(Arg693*)VAF: 5.3%    Contains abnormal data SRSF2 ( Pathogenic )   SRSF2 c.284C>T p.(Pro95Leu)    02/21/2023:  Bone marrow bx- Hypercellular bone marrow (90%) with disorganized trilineage hematopoiesis, megakaryocytic dysplasia (2% blasts by manual touch prep differential and <5% CD34-positive blasts by immunohistochemical analysis)  Reticulin and trichrome stains reveal a patchy dense increase in reticulin fibrosis and no significant collagen fibrosis. (MF1-2).    Cytogenetics:  46,XX,add(4)(q33),add(17)(q21)[20]     WBC 19.3 hemoglobin 9.9 platelet 18; 84 segs 9 lymphs 3 monos 3 eos  Primary myelofibrosis, intermediate-2 by DIPSS-Plus, High-risk by MIPSS70-Plus - with dysplastic progression      03/11/2023:  Began Momelotinib 100 mg daily due to clinical, molecular and radiographic progression.  After stopping Ruxolitinib   had been on observation  06/18/2023:  Abdominal U/S- Splenomegaly with largest diameter measuring 17.3 cm and calculated volume of 1147 mL      09/13/2023: CT AP- Spleen increased in size when compared to 01/14/2023, now 21 cm.   Deep inguinal and iliac chain lymphadenopathy     10/05/2023:  [facility]- Hematology Consult  Fatigued.  ECOG 2  WBC 27.9 hemoglobin 10.3 platelet 15; 54 segs 12 lymphs 6 monos 9 eos 2 basos 8 metas 4 myelos 5 bands 5 NRBC  LDH 621    11/05/2023:  Scheduled follow up for myelofibrosis.  Experiencing increased LUQ abdominal pain which is radiating to her back.  Taking Oxycodone  PRN which she has been prescribed by Pain Management.    Will begin Hydrea 500 mg po Monday, Wednesday Friday.  Continue Ojara.        PAST MEDICAL HISTORY  Past Medical History[1]    SURGICAL HISTORY  Past Surgical History[2]    GYNECOLOGIC HISTORY  Menstrual History:  OB History       Gravida   2    Para   2    Term   2    Preterm        AB        Living             SAB        IAB        Ectopic        Molar        Multiple        Live Births                        ALLERGIES:  Allergies[3]    MEDICATIONS:  Current Medications[4]     FAMILY HISTORY  Family History[5]    SOCIAL HISTORY  Social History[6]     REVIEW OF SYSTEMS  Constitutional: No fever, shaking chills. Night sweats occur regularly and not better since  starting momelotinib.  Has lost 20 lbs over the past 2 years- most of it last year- currently stable.   ECOG 2.    HEENT: No visual changes or hearing deficit. No changes in voice.  Mouth sores- mainly on side of tongue and edge of mouth- not using anything for them as they are intermittent.       Pulmonary: No unusual cough, sore throat, or orthopnea.   Cardiovascular: DOE walking and chest pain on walking < 50 feet associated with palpitations.       Gastrointestinal: No nausea, vomiting.  Some LUQ abdominal discomfort which radiates to the back.   Genitourinary: Frequency, urgency due to diuretics.     Musculoskeletal: No arthralgias or myalgias; no back pain;   Hematologic: No bleeding tendency or easy bruisability.   Skin: No rash, scaling, sores, lumps, or jaundice.  Vascular: No peripheral arterial or venous thromboembolic disease.   Psychological: No anxiety, depression, or mood changes; no mental health illnesses.   Neurological: Some numbness of feet and toes.  No syncope, or near syncopal episodes.  Clemens twice last month- once tripped and fell, another episode occurred at home when she was dizzy.      PHYSICAL EXAMINATION  There were no vitals taken for this visit.     General:   Comfortable.  Here with husband.  Chronically ill appearing   Eyes:   Pupil equal round reacting to light and accomodation.  Extra occular muscles intact, and sclera clear and without icteris.   ENT:   Oropharynx without mucositis, or thrush.     Neck:   Supple without any enlargement, no thyromegaly, bruit, or jugular venous distention.   Lymph Nodes:  No adenopathy (cervical, supraclavicular, axillary, inguinal)   Cardiovascular:  RRR, normal S1, S2 without murmur, rub, or gallop.  Pulses 2+ equal on both sides without any bruits.   Lungs:  Clear to auscultation bilaterally, without wheezes/crackles/rhonchi.  Good air movement.   Skin:    No rash.lesions/breakdown   Psychiatry:   Alert and oriented to person, place, and time    Abdomen:   Normoactive bowel sounds, abdomen soft, and not distended, Spleen enlarged and tender   Extremities:   No bilateral cyanosis, clubbing or edema.  No rash, lesions, or petechiae.   Musculo Skeletal:   No joint tenderness, deformity, effusions.  No spine or costovertebral angle tenderness.  Full range of motion in shoulder, elbow, hip, knee, ankle, hands and feet.   Neurological:  Alert and oriented to person, place and time.  Cranial nerves II-XII grossly intact, normal gait, normal sensation throughout, normal cerebellar function.           LABORATORY STUDIES  No visits with results within 1 Month(s) from this visit.   Latest known visit with results is:   Office Visit on 10/05/2023   Component Date Value Ref Range Status    Sodium 10/05/2023 142  135 - 153 mmol/L Final    Potassium 10/05/2023 4.6  3.5 - 5.3 mmol/L Final    Chloride 10/05/2023 102  98 - 110 mmol/L Final    CO2 10/05/2023 36.0 (H)  24.0 - 31.0 mmol/L Final    Anion Gap 10/05/2023 4 (L)  5 - 16 mmol/L Final    BUN 10/05/2023 19  8 - 21 mg/dL Final    Creatinine 91/70/7974 1.30  0.50 - 1.40 mg/dL Final    BUN/Creatinine Ratio 10/05/2023 15  6 - 20 Final    eGFR CKD-EPI (2021) Female  10/05/2023 43 (L)  >=60 mL/min/1.79m2 Final    eGFR calculated with CKD-EPI 2021 equation in accordance with [employer] and [employer] of Nephrology Task Force recommendations.    Glucose 10/05/2023 105  75 - 110 mg/dL Final    Calcium 91/70/7974 8.8  8.5 - 11.0 mg/dL Final    Albumin 91/70/7974 3.9  3.1 - 4.7 g/dL Final    Total Protein 10/05/2023 6.9  6.0 - 8.0 g/dL Final    Total Bilirubin 10/05/2023 0.9  0.2 - 1.2 mg/dL Final    AST 91/70/7974 15  10 - 40 U/L Final    ALT 10/05/2023 7  3 - 33 U/L Final    Alkaline Phosphatase 10/05/2023 73  33 - 120 U/L Final    Globulin, Total 10/05/2023 3.0  g/dL Final    Osmolality Calculated 10/05/2023 286  mOsm/kg Final    Albumin/Globulin Ratio 10/05/2023 1.3   Final    LDH 10/05/2023 621 (H)  100 - 220 U/L Final    WBC 10/05/2023 27.9 (H)  4.8 - 10.8 10*9/L Final    RBC 10/05/2023 3.88 (L)  4.20 - 5.40 10*12/L Final    HGB 10/05/2023 10.3 (L)  12.0 - 16.0 g/dL Final    HCT 91/70/7974 35.8 (L)  37.0 - 47.0 % Final    MCV 10/05/2023 92.3  81.0 - 102.0 fL Final    MCH 10/05/2023 26.5  26.0 - 34.0 pg Final    MCHC 10/05/2023 28.8 (L)  31.0 - 35.5 g/dL Final    RDW 91/70/7974 20.3 (H)  11.5 - 15.5 % Final    MPV 10/05/2023    Final    Result not available    Platelet 10/05/2023 15 (LL)  130 - 400 10*9/L Final    Immature Platelet Fraction 10/05/2023 20.3 (H)  1.0 - 7.0 % Final    nRBC 10/05/2023 5 (H)  0 - 2 /100 WBCs Final    Neutrophils % 10/05/2023 52.6  % Final    Immature Granulocytes Relative Per* 10/05/2023 25.7  % Final    Lymphocytes % 10/05/2023 9.6  % Final    Monocytes % 10/05/2023 3.8  % Final    Eosinophils % 10/05/2023 5.9  % Final    Basophils % 10/05/2023 2.4  % Final    Absolute Neutrophils 10/05/2023 14.7 (H)  1.4 - 6.5 10*9/L Final    Immature Granulocytes Absolute Cou* 10/05/2023 7.2 (H)  0.0 - 0.6 10*9/L Final    Absolute Lymphocytes 10/05/2023 2.7  1.2 - 3.4 10*9/L Final    Absolute Monocytes 10/05/2023 1.1 (H)  0.1 - 0.5 10*9/L Final    Absolute Eosinophils 10/05/2023 1.7 (H)  0.0 - 0.7 10*9/L Final    Absolute Basophils 10/05/2023 0.7 (H)  0.0 - 0.4 10*9/L Final    Neutrophils % 10/05/2023 54  % Final    Lymphocytes % 10/05/2023 12  % Final    Monocytes % 10/05/2023 6  % Final    Eosinophils % 10/05/2023 9  % Final    Basophils % 10/05/2023 2  % Final    Metamyelocytes % 10/05/2023 8  % Final    Myelocytes % 10/05/2023 4  % Final    Bands % 10/05/2023 5  % Final    Absolute Neutrophils 10/05/2023 16.5 (H)  1.4 - 6.5 10*9/L Final    Absolute Lymphocytes 10/05/2023 3.3  1.2 - 3.4 10*9/L Final    Absolute Monocytes 10/05/2023 1.7 (H)  0.1 - 0.5 10*9/L Final  Absolute Eosinophils 10/05/2023 2.5 (H)  0.0 - 0.7 10*9/L Final    Absolute Basophils 10/05/2023 0.6 (H)  0.0 - 0.4 10*9/L Final    Smear Review Comments 10/05/2023 See Comment Undefined Final    Normal PLT Morphology    Polychromasia 10/05/2023 Moderate (A)  Not Present Final    Ovalocytes 10/05/2023 Moderate (A)  Not Present Final    Poikilocytosis 10/05/2023 Slight (A)  Not Present Final        The total time spent discussing the previous history, imaging studies, laboratory studies, the role and rationale of treating myelofibrosis, and discussion was 30 minutes.  At least 50% of that time was spent in answering questions and counseling.    FOLLOW UP: AS DIRECTED       Ccc:               [1]   Past Medical History:  Diagnosis Date    Anxiety     Arthritis     Asthma (HHS-HCC)     Cancer    (CMS-HCC)     Cataract     Chronic kidney disease     Depression     Difficult intravenous access     GERD (gastroesophageal reflux disease)     Heart failure    (CMS-HCC)     HLD (hyperlipidemia)     Hypertension     Myelofibrosis    (CMS-HCC)     Nephrolithiasis     Sleep apnea     Tinnitus    [2]   Past Surgical History:  Procedure Laterality Date    HYSTERECTOMY      PR CYSTO/URETERO W/LITHOTRIPSY &INDWELL STENT INSRT Left 08/21/2018    Procedure: priority CYSTOURETHROSCOPY, WITH URETEROSCOPY AND/OR PYELOSCOPY; WITH LITHOTRIPSY INCLUDING INSERTION OF INDWELLING URETERAL STENT;  Surgeon: Nicholaus Mt Viprakasit, MD;  Location: CYSTO PROCEDURE SUITES [facility];  Service: Urology    PR CYSTO/URETERO/PYELOSCOPY, CALCULUS TX Left 09/04/2018    Procedure: CYSTOURETHROSCOPY, W/URETEROSCOPY &/OR PYELOSCOPY; W/REMOVAL/MANIPULATION CALCULUS(URETERAL CATH INCLUDED);  Surgeon: Nicholaus Mt Viprakasit, MD;  Location: CYSTO PROCEDURE SUITES [facility];  Service: Urology    PR CYSTOSCOPY,INSERT URETERAL STENT Left 05/04/2022    Procedure: CYSTOURETHROSCOPY,  WITH INSERTION OF INDWELLING URETERAL STENT (EG, GIBBONS OR DOUBLE-J TYPE);  Surgeon: Drena Darryle Gull, MD;  Location: MAIN OR [facility];  Service: Urology    PR CYSTOSCOPY,REMV CALCULUS,SIMPLE Left 09/04/2018    Procedure: CYSTOURETHROSCOPY, WITH REMOVAL OF FOREIGN BODY, CALCULUS OR URETERAL STENT FROM URETHRA OR BLADDER; SIMPLE;  Surgeon: Nicholaus Mt Viprakasit, MD;  Location: CYSTO PROCEDURE SUITES [facility];  Service: Urology    PR CYSTOURETHROSCOPY,URETER CATHETER Left 08/21/2018    Procedure: CYSTOURETHROSCOPY, W/URETERAL CATHETERIZATION, W/WO IRRIG, INSTILL, OR URETEROPYELOG, EXCLUS OF RADIOLG SVC;  Surgeon: Nicholaus Mt Viprakasit, MD;  Location: CYSTO PROCEDURE SUITES [facility];  Service: Urology    PR CYSTOURETHROSCOPY,URETER CATHETER Left 09/04/2018    Procedure: CYSTOURETHROSCOPY, W/URETERAL CATHETERIZATION, W/WO IRRIG, INSTILL, OR URETEROPYELOG, EXCLUS OF RADIOLG SVC;  Surgeon: Nicholaus Mt Viprakasit, MD;  Location: CYSTO PROCEDURE SUITES [facility];  Service: Urology    PR PERQ DILATION XST TRC NEW ACCESS RENAL COLTJ SYS Left 04/24/2022    Procedure: DILATION OF EXISTING TRACT, PERC, FOR ENDOUROLOGIC PROC INCL IMAGING GUIDANCE & RADIOLOGIC SUPERVISION & INTERPRET, W POSTPROC TUBE PLACEMENT, WHEN PERF; INCL NEW ACCESS INTO RENAL COLLECTING SYSTEM;  Surgeon: Mitch Alm Pickles, MD;  Location: [facility] OR [facility];  Service: Urology    PR PERQ NL/PL LITHOTRP COMPLEX >2 CM MLT LOCATIONS Left 04/24/2022    Procedure: standard  PCNL prone vs. standard; need fortec;  Surgeon: Mitch Alm Pickles, MD;  [facility];  Service: Urology    PR REMOVE BLADDER STONE,<2.5 CM N/A 08/21/2018    Procedure: LITHOLAPAXY: CRUSH/FRAGMENT CALCULUS, BY ANY MEANS IN BLADDER & REMOVAL OF FRAGMENTS; SIMPLE/SMALL <2.5 CM;  Surgeon: Nicholaus Mt Viprakasit, MD;  Location: CYSTO PROCEDURE SUITES [facility];  Service: Urology    SALPINGOOPHORECTOMY Right     12/07/2010    VAGINAL HYSTERECTOMY      12/07/2010   [3]   Allergies  Allergen Reactions    Fentanyl Anaphylaxis    Codeine Other (See Comments)     Headache, nausea    Corticosteroids (Glucocorticoids) Headache     **ALL STEROIDS**    Gabapentin Other (See Comments)     oversedation    Other Reaction(s): Other (see comments)    oversedation Other reaction(s): Other (See Comments)   oversedation   oversedation   oversedation    Prednisone  Headache    Pregabalin Rash     Swelling and general fatigue    Other Reaction(s): Other (see comments)    Swelling and general fatigue   Other reaction(s): Other (see comments)   Swelling and general fatigue   Swelling and general fatigue   [4]   Current Outpatient Medications:     albuterol  HFA 90 mcg/actuation inhaler, Inhale 2 puffs every six (6) hours as needed for wheezing or shortness of breath., Disp: 8 g, Rfl: 3    cholecalciferol , vitamin D3-50 mcg, 2,000 unit,, 50 mcg (2,000 unit) tablet, Take 1 tablet (50 mcg total) by mouth in the morning., Disp: , Rfl:     diclofenac  sodium (VOLTAREN ) 1 % gel, Apply 4 g topically four (4) times a day as needed for arthritis or pain (Knee pain)., Disp: 100 g, Rfl: 2    fluticasone propionate (FLONASE) 50 mcg/actuation nasal spray, 2 sprays into each nostril daily as needed for allergies., Disp: , Rfl:     furosemide  (LASIX ) 40 MG tablet, Take 1 tablet (40 mg total) by mouth daily., Disp: 90 tablet, Rfl: 3    levocetirizine (XYZAL) 5 MG tablet, Take 1 tablet (5 mg total) by mouth every evening., Disp: 90 tablet, Rfl: 3    lidocaine (LIDODERM) 5 % patch, Place 1 patch on the skin daily. Apply to affected area for 12 hours only each day (then remove patch), Disp: , Rfl:     lovastatin  (MEVACOR ) 40 MG tablet, Take 1 tablet (40 mg total) by mouth daily., Disp: 90 tablet, Rfl: 1    miscellaneous medical supply Misc, , Disp: , Rfl:     momelotinib (OJJAARA ) 100 mg tablet, Take 1 tablet (100 mg total) by mouth daily. Swallow tablets whole; do not cut, crush, or chew., Disp: 30 tablet, Rfl: 2    NARCAN 4 mg/actuation nasal spray, 1 spray into alternating nostrils once as needed., Disp: , Rfl: 0    omeprazole  (PRILOSEC) 40 MG capsule, Take 1 capsule (40 mg total) by mouth daily., Disp: 90 capsule, Rfl: 3    ondansetron  (ZOFRAN -ODT) 4 MG disintegrating tablet, Dissolve 1 tablet (4 mg total) in the mouth every eight (8) hours as needed for nausea., Disp: 15 tablet, Rfl: 3    oxyCODONE -acetaminophen (PERCOCET) 10-325 mg per tablet, Take 1 tablet by mouth every six (6) hours as needed for pain., Disp: , Rfl:     polyethylene glycol (GOLYTELY ) 236-22.74-6.74 gram solution, Take by mouth as directed per [facility] GI prep instructions, for split bowel prep. (  Patient not taking: Reported on 10/29/2023), Disp: 4000 mL, Rfl: 0    sertraline  (ZOLOFT ) 25 MG tablet, Take 1 tablet (25 mg total) by mouth daily., Disp: 90 tablet, Rfl: 3    spironolactone  (ALDACTONE ) 25 MG tablet, Take 1 tablet (25 mg total) by mouth daily., Disp: 90 tablet, Rfl: 2    ULTRA-LIGHT ROLLATOR Misc, Use as directed., Disp: , Rfl: 0  [5]   Family History  Problem Relation Age of Onset    Hypertension Mother     Cancer Brother         bone    Hypertension Brother     Hypertension Brother     Hypertension Brother     Anesthesia problems Neg Hx     Bleeding Disorder Neg Hx     Breast cancer Neg Hx     Ovarian cancer Neg Hx     Colon cancer Neg Hx     Endometrial cancer Neg Hx    [6]   Social History  Socioeconomic History    Marital status: Married   Tobacco Use    Smoking status: Never    Smokeless tobacco: Never   Vaping Use    Vaping status: Never Used   Substance and Sexual Activity    Alcohol use: No    Drug use: No    Sexual activity: Not Currently     Social Drivers of Health     Financial Resource Strain: Low Risk  (04/25/2022)    Overall Financial Resource Strain (CARDIA)     Difficulty of Paying Living Expenses: Not hard at all   Food Insecurity: No Food Insecurity (05/17/2023)    Hunger Vital Sign     Worried About Running Out of Food in the Last Year: Never true     Ran Out of Food in the Last Year: Never true   Transportation Needs: No Transportation Needs (05/17/2023)    PRAPARE - Therapist, art (Medical): No     Lack of Transportation (Non-Medical): No   Stress: No Stress Concern Present (08/09/2023) [employer] of Occupational Health - Occupational Stress Questionnaire     Feeling of Stress: Not at all   Housing: Low Risk  (05/17/2023)    Housing     Within the past 12 months, have you ever stayed: outside, in a car, in a tent, in an overnight shelter, or temporarily in someone else's home (i.e. couch-surfing)?: No     Are you worried about losing your housing?: No

## 2023-11-09 DIAGNOSIS — D7581 Myelofibrosis: Principal | ICD-10-CM

## 2023-11-09 NOTE — Unmapped (Signed)
 Emily Walter has been contacted in regards to their refill of OJJAARA  100 mg tablet (momelotinib). At this time, they have declined refill due to patient having 30 doses remaining. Refill assessment call date has been updated per the patient's request.

## 2023-11-12 ENCOUNTER — Other Ambulatory Visit: Admit: 2023-11-12 | Discharge: 2023-11-13 | Payer: Medicare (Managed Care)

## 2023-11-12 DIAGNOSIS — D696 Thrombocytopenia, unspecified: Principal | ICD-10-CM

## 2023-11-12 DIAGNOSIS — D7581 Myelofibrosis: Principal | ICD-10-CM

## 2023-11-12 LAB — MANUAL DIFFERENTIAL
BANDS - REL (DIFF): 10 %
BASOPHILS - ABS (DIFF): 0.6 10*9/L — ABNORMAL HIGH (ref 0.0–0.4)
BASOPHILS - REL (DIFF): 3 %
EOSINOPHILS - ABS (DIFF): 0.8 10*9/L — ABNORMAL HIGH (ref 0.0–0.7)
EOSINOPHILS - REL (DIFF): 4 %
LYMPHOCYTES - ABS (DIFF): 2.5 10*9/L (ref 1.2–3.4)
LYMPHOCYTES - REL (DIFF): 13 %
METAMYELOCYTES-REL (DIFF): 4 %
MONOCYTES - ABS (DIFF): 1.3 10*9/L — ABNORMAL HIGH (ref 0.1–0.5)
MONOCYTES - REL (DIFF): 7 %
MYELOCYTES - REL (DIFF): 5 %
NEUTROPHILS - ABS (DIFF): 12.2 10*9/L — ABNORMAL HIGH (ref 1.4–6.5)
NEUTROPHILS - REL (DIFF): 54 %

## 2023-11-12 LAB — CBC W/ AUTO DIFF
BASOPHILS ABSOLUTE COUNT: 0.9 10*9/L — ABNORMAL HIGH (ref 0.0–0.4)
BASOPHILS RELATIVE PERCENT: 4.9 %
EOSINOPHILS ABSOLUTE COUNT: 1.4 10*9/L — ABNORMAL HIGH (ref 0.0–0.7)
EOSINOPHILS RELATIVE PERCENT: 7.5 %
HEMATOCRIT: 36.4 % — ABNORMAL LOW (ref 37.0–47.0)
HEMOGLOBIN: 10.5 g/dL — ABNORMAL LOW (ref 12.0–16.0)
IMMATURE GRANULOCYTES ABSOLUTE COUNT: 4.5 10*9/L — ABNORMAL HIGH (ref 0.0–0.6)
IMMATURE GRANULOCYTES RELATIVE PERCENT: 23.4 %
IMMATURE PLATELET FRACTION: 16.9 % — ABNORMAL HIGH (ref 1.0–7.0)
LYMPHOCYTES ABSOLUTE COUNT: 2 10*9/L (ref 1.2–3.4)
LYMPHOCYTES RELATIVE PERCENT: 10.5 %
MEAN CORPUSCULAR HEMOGLOBIN CONC: 28.8 g/dL — ABNORMAL LOW (ref 31.0–35.5)
MEAN CORPUSCULAR HEMOGLOBIN: 26.9 pg (ref 26.0–34.0)
MEAN CORPUSCULAR VOLUME: 93.1 fL (ref 81.0–102.0)
MONOCYTES ABSOLUTE COUNT: 0.8 10*9/L — ABNORMAL HIGH (ref 0.1–0.5)
MONOCYTES RELATIVE PERCENT: 4.1 %
NEUTROPHILS ABSOLUTE COUNT: 9.5 10*9/L — ABNORMAL HIGH (ref 1.4–6.5)
NEUTROPHILS RELATIVE PERCENT: 49.6 %
NUCLEATED RED BLOOD CELLS: 7 /100{WBCs} — ABNORMAL HIGH (ref 0–2)
PLATELET COUNT: 13 10*9/L — CL (ref 130–400)
RED BLOOD CELL COUNT: 3.91 10*12/L — ABNORMAL LOW (ref 4.20–5.40)
RED CELL DISTRIBUTION WIDTH: 20.8 % — ABNORMAL HIGH (ref 11.5–15.5)
WBC ADJUSTED: 19.1 10*9/L — ABNORMAL HIGH (ref 4.8–10.8)

## 2023-11-12 NOTE — Unmapped (Signed)
 Critical platelets 13, wbc 19.1, hgb 10.5. Pt reports small amount of blood in her urine this morning after waking. Pt says she has not had any more bleeding episodes since. Pt reports she was started on hydrea on 11/05/23, taking 500 mg on Mon-Wed-Fri. Dr. Bernie made aware of previous and advised to have pt rtc in 1 week for repeat cbc. Pt instructed to follow thrombocytopenic precautions and report bleeding or other serious issues immediately. Pt v/u. Pt reports she and her husband moved from [facility] to [place] this past weekend and says she will need to find oncology care closer to where she lives now.

## 2023-11-14 ENCOUNTER — Ambulatory Visit: Admit: 2023-11-14 | Discharge: 2023-11-15 | Payer: Medicare (Managed Care) | Attending: Medical | Primary: Medical

## 2023-11-14 DIAGNOSIS — N390 Urinary tract infection, site not specified: Principal | ICD-10-CM

## 2023-11-14 DIAGNOSIS — N200 Calculus of kidney: Principal | ICD-10-CM

## 2023-11-14 DIAGNOSIS — Z87442 Personal history of urinary calculi: Principal | ICD-10-CM

## 2023-11-14 NOTE — Unmapped (Signed)
 Assessment:  LEFT staghorn calculus. S/p LEFT PCNL [place]) 04/24/22. Complicated by gross hematuria, hydronephrosis and urosepsis.   Recurrent UTI's. Completed ppx Doxy.   -has not had any infections since her last visit.   Uric acid nephrolithiasis. CT abd/pelvis 09/13/23 with Hyperattenuating material within the renal calyces and collecting systems and layering in the bladder may reflect milk of calcium stones. No hydronephrosis seen.   -Asymptomatic today.   Mixed LUTs.   N/V, biliary colic.   -Cholecystectomy postponed d/t other health concerns.    Splenomegaly. Followed by Heme/Onc.   CHF. Follows with [facility].   CKD3. Follows with PCP.   DM  Thrombocytopenia (severe).   Chronic lumbar back pain. Managed by pain management.   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 Follows with [facility]. S/p bone marrow biopsy 02/21/23. On Momelotinib. Current GOC is palliative.   OSA    Plan:  Patient will require urine culture prior to being treated for any UTI symptoms.   Do NOT treat asymptomatic bacteriuria.     RTO 6 months with renal US  done prior. If stable can transition to annual stone follow-up.      Toya Cassette, MPAP, PA-C  Reconstructive Urology at [facility], [place]   11/15/23  Phone: [phone]  Pager: [phone]      Referring Provider: Dr. Mitch     HPI:   73 y.o. female     Patient with above history who was initially seen by Dr. Mitch on 03/10/2022 d/t L staghorn calculus. Decision was made to proceed with L PCNL which was done on 04/24/2022. Stent with string was to be removed on POD#5. She then experienced a possible choking event POD#0 and was transferred to ICU. She required transfer to [facility] from [facility] on 3/20 d/t ICU status and pressor requirements along with concern of post-operative bleeding. She did not require embolization after transfer. She was discharged home on 04/28/22.     She then presented to OSH on 3/27 after removing her ureteral stent at home. She became febrile to 102F. At the OSH she became tachycardic and hypotensive. She was transferred to [facility] for further care on 3/28. She was noted to have L hydronephrosis on imaging at that time so a L ureteral stent was placed on 3/28 (found to have clot in the collecting system). She continued to have gross hematuria during her hospital stay. Angiogram was done with [facility] on 4/6 but no active bleeding was noted. She did not require embolization. She ultimately passed TOV and had her ureteral stent removed at the bedside prior to discharge on 4/10.     Interval history (06/15/22):  She is here today for follow-up after recent PCNL.   She reports doing well.   Denies any flank pain, fever, chills, gross hematuria or dysuria.     Stone analysis: 100% Uric Acid  Aerobic culture: No growth     Renal US  results pending.     Did not complete LithoLink study.   States that she has cut back to only drinking one pepsi a day.     Interval history (12/27/22):  Here for 6 month follow-up for nephrolithiasis.   Has not passed any stones since her last visit.   Denies any gross hematuria.   States she is having LEFT flank pain today.   She has had at least 2 culture proven UTI's (E. Coli) since last visit.   With each episode she develops L flank pain.   She uses premarin   cream that is prescribed by her PCP.     Renal US  done 12/18/22 showed only a 4 mm stone in RLP with no hydronephrosis.     She also reports ongoing biliary colic and n/v.   She is scheduled to have a cholecystectomy in January.   Requesting something for nausea today.     Denies fever, chills or dysuria.     Interval history (02/14/23):  Here for follow-up on recurrent UTI's and left flank pain.   Urine culture at her last visit showed 10-50K Enterococcus and she was treated with a course of abx.   Otherwise she has not had an infection since her last visit.   She was seen in the ER on 12/8 for constipation. UA at that time was negative for infection. CT abd/pelvis was done which showed asymmetric left renal pelvis urothelial thickening and mild surrounding stranding unchanged likely related to prior inflammation from staghorn calculi. No hydronephrosis or ureteral stones noted.     She continues to report having LEFT sided pain. It starts in the LUQ and radiates around to the flank. No associated n/v, gross hematuria, dysuria, fever or chills.   Husband states that she had imaging done with her pain doctor in [place] last month which showed several bulging discs in her spine. They are wanting to do steroid injections after she has her gallbladder removed later this month.     No UTI symptoms reported today.     Interval history (05/10/23):  Patient presents today for follow-up of recurrent UTIs and left flank pain.  Per chart review she underwent a bone marrow biopsy in January which showed dysplastic progression of her primary myelofibrosis.  She was also found to have worsening splenomegaly with anemia.  This was felt to be the cause of her left-sided abdominal and flank pain.  She was started on momelotinib by hematology.  She states that since then she is feeling much better.  Her left-sided abdominal and flank pain have resolved.  She also has much more energy.    She was started on doxycycline  daily for 90 days at her last visit currently denies.  States that she has not had any infections since her last visit.  She is tolerating the medication well.    She denies any gross hematuria, dysuria, fever, chills, nausea or vomiting.     Her cholecystectomy has been postponed due to her other medical issues.    Interval history (11/14/23):  Here today for 6 month f/up of recurrent UTI's and nephrolithiasis.   Since her last visit stopped taking ppx Doxy as discussed.   States that she has not had any symptomatic infections since then.   No UTI symptoms today.   Does feel that she is able to empty her bladder completely.     She has not passed any stones since her last visit. Denies any flank pain or gross hematuria.   CT abd/pelvis done 09/13/23 showed: Hyperattenuating material within the renal calyces and collecting systems and layering in the bladder may reflect milk of calcium stones.   States that her urine sometimes has sand like particles in it. This is not bothersome to her.     Past history reviewed and unchanged    ROS:   A comprehensive 10-system review was negative, except as noted in HPI.  The patient was asked to review all abnormal responses not pertinent to today's visit with their primary care physician.    BP 132/62 (BP Position: Sitting,  BP Cuff Size: X-Large)  - Pulse 90  - SpO2 96%     Physical Exam:  General: well developed, well nourished, no acute distress  HEENT: PERLA, EOM intact, normocephalic, atraumatic  Neck: supple and no masses  Chest: symmetrical  Lungs: non-labored breathing  Heart: normal rhythm, no JVD  Abdomen: no tenderness, no masses or hernias, no palpable organomegaly  GU: voiding spontaneously  Extremities: no deformities, no edema, no cyanosis

## 2023-11-15 DIAGNOSIS — D7581 Myelofibrosis: Principal | ICD-10-CM

## 2023-11-16 NOTE — Unmapped (Signed)
 Clinical Pharmacist Practitioner: [facility]       Patient Information: Emily Walter  is a 73 y.o. year-old female with  primary myelofibrosis who is currently on momelotinib therapy. I called patient re abnormal LFTs on 9/29 and stopping momelotinib    Momelotinib 100mg  daily since 03/11/23.     A/P:   -Labs 11/05/23: tbili 3.9, ast/alt 124-139 (ast 3x uln, alt 4x uln)  -hold momelotinib for now until appointment with Emily Walter on wed 10/15    Emily Walter verbalized understanding of the treatment plan provided and had no further questions.       F/u: 11/21/23 with Emily Walter    ____________________________________________________________________________________      Labs:   No visits with results within 1 Day(s) from this visit.   Latest known visit with results is:   Lab on 11/12/2023   Component Date Value Ref Range Status    WBC 11/12/2023 19.1 (H)  4.8 - 10.8 10*9/L Final    RBC 11/12/2023 3.91 (L)  4.20 - 5.40 10*12/L Final    HGB 11/12/2023 10.5 (L)  12.0 - 16.0 g/dL Final    HCT 89/93/7974 36.4 (L)  37.0 - 47.0 % Final    MCV 11/12/2023 93.1  81.0 - 102.0 fL Final    MCH 11/12/2023 26.9  26.0 - 34.0 pg Final    MCHC 11/12/2023 28.8 (L)  31.0 - 35.5 g/dL Final    RDW 89/93/7974 20.8 (H)  11.5 - 15.5 % Final    MPV 11/12/2023    Final    No result available    Platelet 11/12/2023 13 (LL)  130 - 400 10*9/L Final    Immature Platelet Fraction 11/12/2023 16.9 (H)  1.0 - 7.0 % Final    nRBC 11/12/2023 7 (H)  0 - 2 /100 WBCs Final    Neutrophils % 11/12/2023 49.6  % Final    Immature Granulocytes Relative Per* 11/12/2023 23.4  % Final    Lymphocytes % 11/12/2023 10.5  % Final    Monocytes % 11/12/2023 4.1  % Final    Eosinophils % 11/12/2023 7.5  % Final    Basophils % 11/12/2023 4.9  % Final    Absolute Neutrophils 11/12/2023 9.5 (H)  1.4 - 6.5 10*9/L Final    Immature Granulocytes Absolute Cou* 11/12/2023 4.5 (H)  0.0 - 0.6 10*9/L Final    Absolute Lymphocytes 11/12/2023 2.0  1.2 - 3.4 10*9/L Final    Absolute Monocytes 11/12/2023 0.8 (H)  0.1 - 0.5 10*9/L Final    Absolute Eosinophils 11/12/2023 1.4 (H)  0.0 - 0.7 10*9/L Final    Absolute Basophils 11/12/2023 0.9 (H)  0.0 - 0.4 10*9/L Final    Neutrophils % 11/12/2023 54  % Final    Lymphocytes % 11/12/2023 13  % Final    Monocytes % 11/12/2023 7  % Final    Eosinophils % 11/12/2023 4  % Final    Basophils % 11/12/2023 3  % Final    Metamyelocytes % 11/12/2023 4  % Final    Myelocytes % 11/12/2023 5  % Final    Bands % 11/12/2023 10  % Final    Absolute Neutrophils 11/12/2023 12.2 (H)  1.4 - 6.5 10*9/L Final    Absolute Lymphocytes 11/12/2023 2.5  1.2 - 3.4 10*9/L Final    Absolute Monocytes 11/12/2023 1.3 (H)  0.1 - 0.5 10*9/L Final    Absolute Eosinophils 11/12/2023 0.8 (H)  0.0 - 0.7 10*9/L Final    Absolute Basophils 11/12/2023 0.6 (  H)  0.0 - 0.4 10*9/L Final    Smear Review Comments 11/12/2023 See Comment  Undefined Final    Normal Platelet Morphology    Polychromasia 11/12/2023 Slight (A)  Not Present Final    Poikilocytosis 11/12/2023 Slight (A)  Not Present Final    macrocytes     Pertinent labs:         Approximate telephone time spent with patient: 5 minutes       Emily Walter, PharmD, BCOP, CPP  Clinical Pharmacist Practitioner, [facility]ology

## 2023-11-17 DIAGNOSIS — D7581 Myelofibrosis: Principal | ICD-10-CM

## 2023-11-21 ENCOUNTER — Ambulatory Visit
Admit: 2023-11-21 | Discharge: 2023-11-21 | Payer: Medicare (Managed Care) | Attending: Adult Health | Primary: Adult Health

## 2023-11-21 ENCOUNTER — Other Ambulatory Visit: Admit: 2023-11-21 | Discharge: 2023-11-21 | Payer: Medicare (Managed Care)

## 2023-11-21 DIAGNOSIS — D7581 Myelofibrosis: Principal | ICD-10-CM

## 2023-11-21 DIAGNOSIS — D696 Thrombocytopenia, unspecified: Principal | ICD-10-CM

## 2023-11-21 LAB — CBC W/ AUTO DIFF
BASOPHILS ABSOLUTE COUNT: 0.6 10*9/L — ABNORMAL HIGH (ref 0.0–0.1)
BASOPHILS RELATIVE PERCENT: 3.9 %
EOSINOPHILS ABSOLUTE COUNT: 0.7 10*9/L — ABNORMAL HIGH (ref 0.0–0.5)
EOSINOPHILS RELATIVE PERCENT: 4.5 %
HEMATOCRIT: 34.5 % (ref 34.0–44.0)
HEMOGLOBIN: 10.9 g/dL — ABNORMAL LOW (ref 11.3–14.9)
LYMPHOCYTES ABSOLUTE COUNT: 1.5 10*9/L (ref 1.1–3.6)
LYMPHOCYTES RELATIVE PERCENT: 11 %
MEAN CORPUSCULAR HEMOGLOBIN CONC: 31.6 g/dL — ABNORMAL LOW (ref 32.0–36.0)
MEAN CORPUSCULAR HEMOGLOBIN: 26.2 pg (ref 25.9–32.4)
MEAN CORPUSCULAR VOLUME: 83 fL (ref 77.6–95.7)
MEAN PLATELET VOLUME: 12.9 fL — ABNORMAL HIGH (ref 6.8–10.7)
MONOCYTES ABSOLUTE COUNT: 0.3 10*9/L (ref 0.3–0.8)
MONOCYTES RELATIVE PERCENT: 1.9 %
NEUTROPHILS ABSOLUTE COUNT: 10.8 10*9/L — ABNORMAL HIGH (ref 1.8–7.8)
NEUTROPHILS RELATIVE PERCENT: 78.7 %
NUCLEATED RED BLOOD CELLS: 5 /100{WBCs} — ABNORMAL HIGH (ref <=4)
PLATELET COUNT: 11 10*9/L — ABNORMAL LOW (ref 150–450)
RED BLOOD CELL COUNT: 4.16 10*12/L (ref 3.95–5.13)
RED CELL DISTRIBUTION WIDTH: 20.6 % — ABNORMAL HIGH (ref 12.2–15.2)
WBC ADJUSTED: 13.8 10*9/L — ABNORMAL HIGH (ref 3.6–11.2)

## 2023-11-21 LAB — SLIDE REVIEW

## 2023-11-21 LAB — LACTATE DEHYDROGENASE: LACTATE DEHYDROGENASE: 512 U/L — ABNORMAL HIGH (ref 120–246)

## 2023-11-21 LAB — COMPREHENSIVE METABOLIC PANEL
ALBUMIN: 3.8 g/dL (ref 3.4–5.0)
ALKALINE PHOSPHATASE: 135 U/L — ABNORMAL HIGH (ref 46–116)
ALT (SGPT): 16 U/L (ref 10–49)
ANION GAP: 10 mmol/L (ref 5–14)
AST (SGOT): 13 U/L (ref <=34)
BILIRUBIN TOTAL: 1.4 mg/dL — ABNORMAL HIGH (ref 0.3–1.2)
BLOOD UREA NITROGEN: 25 mg/dL — ABNORMAL HIGH (ref 9–23)
BUN / CREAT RATIO: 17
CALCIUM: 9.3 mg/dL (ref 8.7–10.4)
CHLORIDE: 100 mmol/L (ref 98–107)
CO2: 33 mmol/L — ABNORMAL HIGH (ref 20.0–31.0)
CREATININE: 1.43 mg/dL — ABNORMAL HIGH (ref 0.55–1.02)
EGFR CKD-EPI (2021) FEMALE: 38 mL/min/1.73m2 — ABNORMAL LOW (ref >=60)
GLUCOSE RANDOM: 94 mg/dL (ref 70–179)
POTASSIUM: 4.1 mmol/L (ref 3.4–4.8)
PROTEIN TOTAL: 7.9 g/dL (ref 5.7–8.2)
SODIUM: 143 mmol/L (ref 135–145)

## 2023-11-21 NOTE — Unmapped (Addendum)
 Stop Hydrea  Resume your Ojjaara  tomorrow  Plan for labs every 2 weeks with possible platelet transfusion  Follow up in 2 months

## 2023-11-21 NOTE — Unmapped (Signed)
 [facility] Follow up Visit    Patient: Emily Walter  MRN: [id number]  DOB: 30-Apr-1950  Date of Visit: 11/21/2023      Reason for Visit   Emily Walter is a 73 y.o. with HFpEF, OSA, DM2, hypertension, CKD 3 (eGFR 25ml/min), staghorn calculi, chronic back pain, and triple negative primary myelofibrosis (higher risk) diagnosed 07/2016 who presents today in early follow up for progressive cytopenias prompting a bone marrow biopsy on 03/03/23.  Momelotinib 100mg  daily since 03/11/23. Currently on HU 500mg  MWF per local oncologist.      Interval History     History of Present Illness  Emily Walter is a 73 year old female with myelofibrosis who presents for management of her condition and medication adjustment.    Was doing well on momelotinib, however her local oncologist  and was recently started on Hydrea (11/05/23). Since stopping Ojara, she experiences intermittent pain that sometimes radiates to her back, which has worsened.    She underwent lab work on Monday and has another scheduled for Wednesday. She recently moved from [facility] to [place], affecting her healthcare logistics, including her insurance plan with [employer].     Her current symptoms include easy bruising and low platelet counts, with platelets at 11. She experiences bruising with minimal contact and has had two significant falls in the past, resulting in bruising on her face and back. She also reports a decrease in appetite, feeling hungry but unable to eat much.    She has a history of neuropathy symptoms, including numbness and pain in her left leg, which resolved long before starting Ojjaara . She uses Zofran  for nausea, placing it under her tongue when feeling sick, and a cream for arthritis pain relief.      Review of Systems   10-systems otherwise reviewed and negative except as per Interval History.    Assessment   #1 Primary myelofibrosis, intermediate-2 by DIPSS-Plus, High-risk by MIPSS70-Plus - with dysplastic progression 02/2023  #2 Thrombocytopenia - severe, plts 18 -  thought secondary to #1  #3 Anemia - Multifactorial, secondary to iron  deficiency, #1, CKD.  Improving 2/2 momelotinib 11.2 g/dL.   #4 Splenomegaly - spleen 19.1cm on CT A/P from 01/14/23; was 15.6cm on  05/2022 and normal in 2020  Since her last visit, Emily Walter has done fairly well.  She remained on momelotinib, dose reduced for thrombocytopenia.  She met with Dr. Bernie at [facility] to receive local transfusions.  Given her symptoms of fatigue, LUQ pain, cytopenias and symptomatic splenomegaly, he stopped her momelotinib and began hydroxyurea MWF.  It was our intention to continue momelotinib because while she did have symptomatic splenomegaly, it was better than what it was prior to starting momelotinib.  Today she reports worsening LUQ pain.  Fortunately her hgb and platelets are stable since there was concern for her platelet count as it was already low prior to starting HU.  Her WBC did decrease with the HU.  However, this does not provide the benefit of spleen reduction/symptom improvement.  Hydrea is ineffective for splenomegaly, causing mass effect on her stomach and decreased appetite. Will have her stop HU and resume momelotinib.     Given her easy bruising, will plan to have her get weekly lab checks with transfusions. Goal is to keep plt count >20 due to excessive bruising.  Since she lives in [place] now, she can easily get to [facility] for transfusion appointments.  Transfusion plan placed for this.      #  5 Chronic lumbar back pain - managed by PCP  #6 Lucency of right shoulder on right shoulder X-ray from 08/10/2021- SPEP/IFE obtained 03/12/23. Faint band suspicious for monoclonal component typed as IgM Lambda. Concentration of possible monoclonal protein cannot be accurately quantified due to the polyclonal background.   #7 HFpEF - no LE edema, stable symptoms - established with [facility]  #8 Staghorn renal calculi - follows with [facility]  #9 CKD3 - eGFR 33ml/min - follows with PCP  #10 Dyspnea and throat pain on exertion - pt had nuclear stress test 03/08/23 - results pending - followed by PCP  #11 Depression - on sertraline  25mg  po daily - managed by PCP  #12 Iron  deficiency- ferritin 140, iron  35, iron  sat 14% on 12/18/2022.  Received Infed  1g on 07/14/22, 03/30/23.  Have not checked ferritin/iron  sats in several months.  Will add to transfusion plan to have drawn. Her hgb and MCV have drifted down, will likely need iron  infusion again in the near future.    #13 Recurrent falls with contusions and soft tissue hematomas    Bruises have improved on her back, though she has multiple on her arms.  She reports frequent bruising with minimal trauma, consistent with low platelet count. Fortunately she has not had any falls recently.  Uses a 4-point cane to ambulate in clinic.      Wt Readings from Last 12 Encounters:   11/21/23 92.9 kg (204 lb 12.9 oz)   11/12/23 95 kg (209 lb 6.4 oz)   11/05/23 95.6 kg (210 lb 12.8 oz)   10/19/23 96.6 kg (213 lb)   10/10/23 98 kg (216 lb)   10/05/23 98.8 kg (217 lb 12.8 oz)   09/19/23 97.1 kg (214 lb 1.1 oz)   09/11/23 98.9 kg (218 lb)   08/30/23 98.3 kg (216 lb 12.8 oz)   08/21/23 99.1 kg (218 lb 7.6 oz)   08/09/23 98.8 kg (217 lb 12.8 oz)   07/27/23 98.9 kg (218 lb)          Plan   Resume momelotinib 100mg  po daily  Dose reduced by 50% due to plts 11, baseline CKD (eGFR 35ml/min). Plan to increase if tolerates.   Indication = PMF with symptomatic splenomegaly + anemia  Monitoring = Labs [facility] CBC-D, CMP - standing orders   STOP Hydroxurea  Avoid aspirin/NSAIDs due to severe thrombocytopenia  Follow up in 2 months    Rhoda Adjutant, RN, MSN, AGPCNP-C  Nurse Practitioner  Hematologic Malignancies  [facility]  11/21/2023      I personally spent 50 minutes face-to-face and non-face-to-face in the care of this patient, which includes all pre, intra, and post visit time on the date of service. This time was spent in reviewing notes, labs and other test results in the chart, speaking with and examining the patient, communicating with other medical teams and documentation of the clinical encounter. All documented time was specific to the E/M visit and does not include any procedures that may have been performed.        History of Present Illness     Hematology/Oncology History   MDS/MPN (myelodysplastic/myeloproliferative neoplasms)    (CMS-HCC)   08/03/2016 Initial Diagnosis    PRIMARY MYELOFIBROSIS vs MDS/MPN with fibrosis    Prior labs show:  2014 at [facility]: normal CBC, WBC 6.5, Hgb 15.2, Plts 181.    CBC from 08/03/2016: WBC 10.3 x 10^9/L, Hgb 12.3 g/dL, MCV 85, plt 52 x 89^0/O.  Due to the cytopenias, Emily Walter was sent to Dr. India Barge and underwent a bone marrow biopsy. I currently do not have bone marrow slides to review.  Outside [facility] report from 08/03/2016 states,  Hypercellular bone marrow (85%) with hyperplastic hematopoiesis.  Atypical megakaryocyte hyperplasia with focal clustering.  Abnormal localization of immature myeloid precursors.  No significant increase in blasts.  Mild to moderate reticulin fibrosis.    Driver mutation = SRSF2 (2018)  Karyotype = 46,XX,add(4)(q33),add(17)(q21)[13]     Higher-risk disease, with thrombocytopenia, SRSF2 mutation.      She was not an optimal alloHSCT candidate due to multiple co-morbidities.      She trialed ruxolitinib  but did not have a good response.     07/14/2022:  Received Infed  1g IV for iron  deficiency.     02/21/2023 Progression    MYELODYSPLASTIC PROGRESSION     She has been on observation alone for several years.  She now has progressive cytopenias, with platelets trending down to teens from 40-50's in April 2024 and hgb trending down from 11-12 g/dL in Feb/March 2024 to 9.9 g/dL now.  WBC has progressively increased, was 10 in 05/2022 and now consistently >15.  Myeloid mutation panel with persistent SRSF2 mutation and new ASXL1 mutation with VAF 5%. In this context, a bone marrow biopsy was performed at [facility] on 02/21/23.  This showed persistent myelofibrosis with dysplastic features, consistent with MDS/MPN or perhaps MDS with myelofibrosis.      Karyotype is pending, but prior karyotype 46,XX with add(4) and add(17).       The bone marrow now appears most consistent with MDS.    For prognostic calculations, I used labs from 02/21/23: ANC 15, hgb 9.9, plts 18, bm blasts 2%, age 53yo, intermediate karyotype, SRSF2, ASXL1.      IPSS:  Int-1 (1 pt)  IPSS-R: Intermediate. (4pt) 20yr median overall survival.  25% AML transformation 3.2 yrs.   IPSS-M: Moderate-High. Leukemia-free survival 2.3 yrs. Overall survival 2.8 yrs.      03/11/2023 -  Chemotherapy    MOMELOTINIB    Indication = primary myelofibrosis with anemia   Starting dose = 100mg  po daily (reduced for platelet count 18)    03/12/23: WBC 24.7, Hgb 10, Plts 18. sCr 1.34.   04/09/23: WBC 26.6, Hgb 10.6, Plts 17.  sCr 1.34. 4% myelocytes, 4% metamyelocytes.   05/10/23: WBC 21.8, hgb 11.6, plt 18  06/11/23: Patient stopped momelotinib without team's knowledge due to what she considered new side effects  06/18/23: WBC 24.4, hgb 10.4, plt 13, sCr 1.46 - resumed momelotinib 100mg   08/21/23: WBC 27.1, hgb 12.1, hmct 39.7, plt 13, sCr 1.6  11/05/23: WBC 18.3, hgb 10.7, hmct 37, plt 11 began hydrea 500mg  WMF per local onc, stop momelotinib  11/26/23: WNC 15.6, hgb 10.7, plt 16 stop HU, resume momelotinib               Medications     Current Outpatient Medications   Medication Sig Dispense Refill    albuterol  HFA 90 mcg/actuation inhaler Inhale 2 puffs every six (6) hours as needed for wheezing or shortness of breath. 8 g 3    cholecalciferol , vitamin D3-50 mcg, 2,000 unit,, 50 mcg (2,000 unit) tablet Take 1 tablet (50 mcg total) by mouth in the morning.      diclofenac  sodium (VOLTAREN ) 1 % gel Apply 4 g topically four (4) times a day as needed for arthritis or pain (Knee pain). 100 g 2 fluticasone propionate (FLONASE) 50  mcg/actuation nasal spray 2 sprays into each nostril daily as needed for allergies.      furosemide  (LASIX ) 40 MG tablet Take 1 tablet (40 mg total) by mouth daily. 90 tablet 3    levocetirizine (XYZAL) 5 MG tablet Take 1 tablet (5 mg total) by mouth every evening. 90 tablet 3    lidocaine (LIDODERM) 5 % patch Place 1 patch on the skin daily. Apply to affected area for 12 hours only each day (then remove patch)      lovastatin  (MEVACOR ) 40 MG tablet Take 1 tablet (40 mg total) by mouth daily. 90 tablet 1    miscellaneous medical supply Misc       NARCAN 4 mg/actuation nasal spray 1 spray into alternating nostrils once as needed.  0    omeprazole  (PRILOSEC) 40 MG capsule Take 1 capsule (40 mg total) by mouth daily. 90 capsule 3    ondansetron  (ZOFRAN -ODT) 4 MG disintegrating tablet Dissolve 1 tablet (4 mg total) in the mouth every eight (8) hours as needed for nausea. 15 tablet 3    oxyCODONE -acetaminophen (PERCOCET) 10-325 mg per tablet Take 1 tablet by mouth every six (6) hours as needed for pain.      sertraline  (ZOLOFT ) 25 MG tablet Take 1 tablet (25 mg total) by mouth daily. 90 tablet 3    spironolactone  (ALDACTONE ) 25 MG tablet Take 1 tablet (25 mg total) by mouth daily. 90 tablet 2    ULTRA-LIGHT ROLLATOR Misc Use as directed.  0    momelotinib (OJJAARA ) 100 mg tablet Take 1 tablet (100 mg total) by mouth daily. Swallow tablets whole; do not cut, crush, or chew. (Patient not taking: Reported on 11/21/2023) 30 tablet 2    polyethylene glycol (GOLYTELY ) 236-22.74-6.74 gram solution Take by mouth as directed per [facility] GI prep instructions, for split bowel prep. (Patient not taking: Reported on 11/21/2023) 4000 mL 0     No current facility-administered medications for this visit.       Allergies     Allergies   Allergen Reactions    Fentanyl Anaphylaxis    Codeine Other (See Comments)     Headache, nausea    Corticosteroids (Glucocorticoids) Headache     **ALL STEROIDS** Gabapentin Other (See Comments)     oversedation    Other Reaction(s): Other (see comments)    oversedation      Other reaction(s): Other (See Comments)   oversedation   oversedation   oversedation    Prednisone  Headache    Pregabalin Rash     Swelling and general fatigue    Other Reaction(s): Other (see comments)    Swelling and general fatigue   Other reaction(s): Other (see comments)   Swelling and general fatigue   Swelling and general fatigue       Past Medical and Surgical History     Past Medical History:   Diagnosis Date    Anxiety     Arthritis     Asthma (HHS-HCC)     Cancer    (CMS-HCC)     Cataract     Chronic kidney disease     Depression     Difficult intravenous access     GERD (gastroesophageal reflux disease)     Heart failure (CMS-HCC)     HLD (hyperlipidemia)     Hypertension     Myelofibrosis    (CMS-HCC)     Nephrolithiasis     Sleep apnea     Tinnitus  Past Surgical History:   Procedure Laterality Date    HYSTERECTOMY      PR CYSTO/URETERO W/LITHOTRIPSY &INDWELL STENT INSRT Left 08/21/2018    Procedure: priority CYSTOURETHROSCOPY, WITH URETEROSCOPY AND/OR PYELOSCOPY; WITH LITHOTRIPSY INCLUDING INSERTION OF INDWELLING URETERAL STENT;  Surgeon: Nicholaus Mt Viprakasit, MD;  [facility]: CYSTO PROCEDURE SUIT[facility];  Service: Urology    PR CYSTO/URETERO/PYELOSCOPY, CALCULUS TX Left 09/04/2018    Procedure: CYSTOURETHROSCOPY, W/URETEROSCOPY &/OR PYELOSCOPY; W/REMOVAL/MANIPULATION CALCULUS(URETERAL CATH INCLUDED);  Surgeon: Nicholaus Mt Viprakasit, MD;  Location: CYSTO PROCEDURE SUITES [facility];  Service: Urology    PR CYSTOSCOPY,INSERT URETERAL STENT Left 05/04/2022    Procedure: CYSTOURETHROSCOPY,  WITH INSERTION OF INDWELLING URETERAL STENT (EG, GIBBONS OR DOUBLE-J TYPE);  Surgeon: Drena Darryle Gull, MD;  Location: MAIN OR [facility];  Service: Urology    PR CYSTOSCOPY,REMV CALCULUS,SIMPLE Left 09/04/2018    Procedure: CYSTOURETHROSCOPY, WITH REMOVAL OF FOREIGN BODY, CALCULUS OR URETERAL STENT FROM URETHRA OR BLADDER; SIMPLE;  Surgeon: Nicholaus Mt Viprakasit, MD;  Location: CYSTO PROCEDURE SUITES [facility];  Service: Urology    PR CYSTOURETHROSCOPY,URETER CATHETER Left 08/21/2018    Procedure: CYSTOURETHROSCOPY, W/URETERAL CATHETERIZATION, W/WO IRRIG, INSTILL, OR URETEROPYELOG, EXCLUS OF RADIOLG SVC;  Surgeon: Nicholaus Mt Viprakasit, MD;  Location: CYSTO PROCEDURE SUITES [facility];  Service: Urology    PR CYSTOURETHROSCOPY,URETER CATHETER Left 09/04/2018    Procedure: CYSTOURETHROSCOPY, W/URETERAL CATHETERIZATION, W/WO IRRIG, INSTILL, OR URETEROPYELOG, EXCLUS OF RADIOLG SVC;  Surgeon: Nicholaus Mt Viprakasit, MD;  Location: CYSTO PROCEDURE SUITES [place];  Service: Urology    PR PERQ DILATION XST TRC NEW ACCESS RENAL COLTJ SYS Left 04/24/2022    Procedure: DILATION OF EXISTING TRACT, PERC, FOR ENDOUROLOGIC PROC INCL IMAGING GUIDANCE & RADIOLOGIC SUPERVISION & INTERPRET, W POSTPROC TUBE PLACEMENT, WHEN PERF; INCL NEW ACCESS INTO RENAL COLLECTING SYSTEM;  Surgeon: Mitch Alm Pickles, MD;  Location: [facility];  Service: Urology    PR PERQ NL/PL LITHOTRP COMPLEX >2 CM MLT LOCATIONS Left 04/24/2022    Procedure: standard PCNL prone vs. standard; need fortec;  Surgeon: Mitch Alm Pickles, MD;  [facility]: [facility] OR [facility];  Service: Urology    PR REMOVE BLADDER STONE,<2.5 CM N/A 08/21/2018    Procedure: LITHOLAPAXY: CRUSH/FRAGMENT CALCULUS, BY ANY MEANS IN BLADDER & REMOVAL OF FRAGMENTS; SIMPLE/SMALL <2.5 CM;  Surgeon: Nicholaus Mt Viprakasit, MD;  Location: CYSTO PROCEDURE SUITES [facility];  Service: Urology    SALPINGOOPHORECTOMY Right     12/07/2010    VAGINAL HYSTERECTOMY      12/07/2010       Social History     Social History     Socioeconomic History    Marital status: Married   Tobacco Use    Smoking status: Never    Smokeless tobacco: Never   Vaping Use    Vaping status: Never Used   Substance and Sexual Activity    Alcohol use: No    Drug use: No    Sexual activity: Not Currently     Social Drivers of Health Financial Resource Strain: Low Risk  (04/25/2022)    Overall Financial Resource Strain (CARDIA)     Difficulty of Paying Living Expenses: Not hard at all   Food Insecurity: No Food Insecurity (05/17/2023)    Hunger Vital Sign     Worried About Running Out of Food in the Last Year: Never true     Ran Out of Food in the Last Year: Never true   Transportation Needs: No Transportation Needs (05/17/2023)    PRAPARE - Transportation     Lack of  Transportation (Medical): No     Lack of Transportation (Non-Medical): No   Stress: No Stress Concern Present (08/09/2023)    [employer] of Occupational Health - Occupational Stress Questionnaire     Feeling of Stress: Not at all   Housing: Low Risk  (05/17/2023)    Housing     Within the past 12 months, have you ever stayed: outside, in a car, in a tent, in an overnight shelter, or temporarily in someone else's home (i.e. 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  Exposed to 2nd hand smoke; she is a lifelong non-smoker.  Went on disability in 12/18/2004 d/t back and has worsened this year.      Enjoys traveling.  Married - husband is widowed.      Family History   Two sons - the youngest died in 12/19/11 at the age of 89 from bladder cancer.     Physical Examination     Vitals:    11/21/23 1255   BP: 125/55   Pulse: 85   Resp: 17   Temp: 36.4 ??C (97.6 ??F)   TempSrc: Temporal   SpO2: 96%   Weight: 92.9 kg (204 lb 12.9 oz)           GENERAL: Appears tired but in no acute distress.  Accompanied by her husband.  HEENT: No scleral icterus.  No periorbital purpura.  RESP: CTAB.    CV: RRR.  No murmurs rubs or gallops appreciated.  ABD: BS present, splenomegaly to about 14cm, limited by body habitus.  NEURO: A&Ox4, CN observed are WNL  PSYCH: Normal affect, affect is full and congruent.  No psychomotor agitation nor retardation.  Thought process is goal-directed and linear.      Laboratory Testing and Imaging Results for orders placed or performed in visit on 11/21/23   Comprehensive Metabolic Panel   Result Value Ref Range    Sodium 143 135 - 145 mmol/L    Potassium 4.1 3.4 - 4.8 mmol/L    Chloride 100 98 - 107 mmol/L    CO2 33.0 (H) 20.0 - 31.0 mmol/L    Anion Gap 10 5 - 14 mmol/L    BUN 25 (H) 9 - 23 mg/dL    Creatinine 8.56 (H) 0.55 - 1.02 mg/dL    BUN/Creatinine Ratio 17     eGFR CKD-EPI 12/19/2019) Female 38 (L) >=60 mL/min/1.53m2    Glucose 94 70 - 179 mg/dL    Calcium 9.3 8.7 - 89.5 mg/dL    Albumin 3.8 3.4 - 5.0 g/dL    Total Protein 7.9 5.7 - 8.2 g/dL    Total Bilirubin 1.4 (H) 0.3 - 1.2 mg/dL    AST 13 <=65 U/L    ALT 16 10 - 49 U/L    Alkaline Phosphatase 135 (H) 46 - 116 U/L   Lactate dehydrogenase   Result Value Ref Range    LDH 512 (H) 120 - 246 U/L   CBC w/ Differential   Result Value Ref Range    WBC 13.8 (H) 3.6 - 11.2 10*9/L    RBC 4.16 3.95 - 5.13 10*12/L    HGB 10.9 (L) 11.3 - 14.9 g/dL    HCT 65.4 65.9 - 55.9 %    MCV 83.0 77.6 - 95.7 fL    MCH 26.2 25.9 - 32.4 pg    MCHC 31.6 (L) 32.0 - 36.0 g/dL  RDW 20.6 (H) 12.2 - 15.2 %    MPV 12.9 (H) 6.8 - 10.7 fL    Platelet 11 (L) 150 - 450 10*9/L    nRBC 5 (H) <=4 /100 WBCs    Neutrophils % 78.7 %    Lymphocytes % 11.0 %    Monocytes % 1.9 %    Eosinophils % 4.5 %    Basophils % 3.9 %    Absolute Neutrophils 10.8 (H) 1.8 - 7.8 10*9/L    Absolute Lymphocytes 1.5 1.1 - 3.6 10*9/L    Absolute Monocytes 0.3 0.3 - 0.8 10*9/L    Absolute Eosinophils 0.7 (H) 0.0 - 0.5 10*9/L    Absolute Basophils 0.6 (H) 0.0 - 0.1 10*9/L    Anisocytosis Moderate (A) Not Present    Hypochromasia Slight (A) Not Present   Morphology Review   Result Value Ref Range    Smear Review Comments See Comment Undefined    Hypersegmented Neutrophils Present (A) Not Present    Toxic Vacuolation Present (A) Not Present    Neutrophil Left Shift Present (A) Not Present     *Note: Due to a large number of results and/or encounters for the requested time period, some results have not been displayed. A complete set of results can be found in Results Review.

## 2023-11-22 DIAGNOSIS — D7581 Myelofibrosis: Principal | ICD-10-CM

## 2023-11-23 DIAGNOSIS — D509 Iron deficiency anemia, unspecified: Principal | ICD-10-CM

## 2023-11-23 DIAGNOSIS — D7581 Myelofibrosis: Principal | ICD-10-CM

## 2023-11-23 DIAGNOSIS — D696 Thrombocytopenia, unspecified: Principal | ICD-10-CM

## 2023-11-26 ENCOUNTER — Inpatient Hospital Stay: Admit: 2023-11-26 | Discharge: 2023-11-26 | Payer: Medicare (Managed Care)

## 2023-11-26 ENCOUNTER — Other Ambulatory Visit: Admit: 2023-11-26 | Discharge: 2023-11-26 | Payer: Medicare (Managed Care)

## 2023-11-26 ENCOUNTER — Encounter
Admit: 2023-11-26 | Discharge: 2023-11-26 | Payer: Medicare (Managed Care) | Attending: Adult Health | Primary: Adult Health

## 2023-11-26 LAB — CBC W/ AUTO DIFF
BASOPHILS ABSOLUTE COUNT: 1 10*9/L — ABNORMAL HIGH (ref 0.0–0.1)
BASOPHILS RELATIVE PERCENT: 6.4 %
EOSINOPHILS ABSOLUTE COUNT: 1.1 10*9/L — ABNORMAL HIGH (ref 0.0–0.5)
EOSINOPHILS RELATIVE PERCENT: 7.2 %
HEMATOCRIT: 34.1 % (ref 34.0–44.0)
HEMOGLOBIN: 10.7 g/dL — ABNORMAL LOW (ref 11.3–14.9)
LYMPHOCYTES ABSOLUTE COUNT: 2 10*9/L (ref 1.1–3.6)
LYMPHOCYTES RELATIVE PERCENT: 12.8 %
MEAN CORPUSCULAR HEMOGLOBIN CONC: 31.5 g/dL — ABNORMAL LOW (ref 32.0–36.0)
MEAN CORPUSCULAR HEMOGLOBIN: 26.5 pg (ref 25.9–32.4)
MEAN CORPUSCULAR VOLUME: 84.1 fL (ref 77.6–95.7)
MEAN PLATELET VOLUME: 10 fL (ref 6.8–10.7)
MONOCYTES ABSOLUTE COUNT: 0.4 10*9/L (ref 0.3–0.8)
MONOCYTES RELATIVE PERCENT: 2.3 %
NEUTROPHILS ABSOLUTE COUNT: 11.1 10*9/L — ABNORMAL HIGH (ref 1.8–7.8)
NEUTROPHILS RELATIVE PERCENT: 71.3 %
NUCLEATED RED BLOOD CELLS: 6 /100{WBCs} — ABNORMAL HIGH (ref <=4)
PLATELET COUNT: 16 10*9/L — ABNORMAL LOW (ref 150–450)
RED BLOOD CELL COUNT: 4.05 10*12/L (ref 3.95–5.13)
RED CELL DISTRIBUTION WIDTH: 20.8 % — ABNORMAL HIGH (ref 12.2–15.2)
WBC ADJUSTED: 15.6 10*9/L — ABNORMAL HIGH (ref 3.6–11.2)

## 2023-11-26 LAB — SLIDE REVIEW

## 2023-11-26 LAB — FERRITIN: FERRITIN: 94.9 ng/mL (ref 7.3–270.7)

## 2023-11-26 LAB — IRON & TIBC
IRON SATURATION: 17 % — ABNORMAL LOW (ref 20–55)
IRON: 44 ug/dL — ABNORMAL LOW (ref 50–170)
TOTAL IRON BINDING CAPACITY: 253 ug/dL (ref 250–425)

## 2023-11-26 NOTE — Unmapped (Signed)
 [facility] Refill Coordination Note    Specialty Medication(s) to be Shipped:   OJJAARA  100 mg tablet (momelotinib)    Other medication(s) to be shipped: No additional medications requested for fill at this time    Specialty Medications not needed at this time: N/A     Emily Walter, DOB: [birth year]  Phone: There are no phone numbers on file.      All above HIPAA information was verified with patient.     Was a Nurse, learning disability used for this call? No    Completed refill call assessment today to schedule patient's medication shipment from the [facility]  [phone]).  All relevant notes have been reviewed.     Specialty medication(s) and dose(s) confirmed: Regimen is correct and unchanged.   Changes to medications: Ambera reports no changes at this time.  Changes to insurance: No  New side effects reported not previously addressed with a pharmacist or physician: None reported  Questions for the pharmacist: No    Confirmed patient received a Conservation officer, historic buildings and a Surveyor, mining with first shipment. The patient will receive a drug information handout for each medication shipped and additional FDA Medication Guides as required.       DISEASE/MEDICATION-SPECIFIC INFORMATION        N/A    SPECIALTY MEDICATION ADHERENCE     Medication Adherence    Patient reported X missed doses in the last month: 0  Specialty Medication: OJJAARA  100 mg tablet (momelotinib)  Patient is on additional specialty medications: No  Patient is on more than two specialty medications: No  Any gaps in refill history greater than 2 weeks in the last 3 months: no  Demonstrates understanding of importance of adherence: yes  Informant: patient  Reliability of informant: reliable  Provider-estimated medication adherence level: good  Patient is at risk for Non-Adherence: No  Reasons for non-adherence: no problems identified  Confirmed plan for next specialty medication refill: delivery by pharmacy  Refills needed for supportive medications: not needed          Refill Coordination    Has the Patients' Contact Information Changed: No  Is the Shipping Address Different: No         Were doses missed due to medication being on hold? No    OJJAARA  100 mg tablet (momelotinib)  : 10 doses of medicine on hand       REFERRAL TO PHARMACIST     Referral to the pharmacist: Not needed      [facility]     Shipping address confirmed in Epic.     Cost and Payment: Patient has a $0 copay, payment information is not required.    Delivery Scheduled: Yes, Expected medication delivery date: 10/23.     Medication will be delivered via UPS to the prescription address in Epic WAM.    Camelia L[facility]  Specialty Technician

## 2023-11-26 NOTE — Unmapped (Signed)
 Lab Results   Component Value Date    WBC 15.6 (H) 11/26/2023    HGB 10.7 (L) 11/26/2023    HCT 34.1 11/26/2023    PLT 16 (L) 11/26/2023        Patient received 1 unit(s) of Platelets for a Platelet level of 16.     Patient was not premedicated.    Transfusion started. Vital signs obtained per policy. Patient tolerated transfusion without issues. See Flowsheet for further details.  Per Asberry Rode, NP stated patient was good to not wait for the platelet redraw.     Patient stable at time of discharge. Patient self ambulated with spouse to lobby.

## 2023-11-26 NOTE — Unmapped (Signed)
 Hospital Outpatient Visit on 11/26/2023   Component Date Value Ref Range Status    PRODUCT CODE 11/26/2023 [id number]   Final    Product ID 11/26/2023 Platelets   Final    Status 11/26/2023 Transfused   Final    Unit # 11/26/2023 [id number]   Final    Unit Blood Type 11/26/2023 B Pos   Final    ISBT Number 11/26/2023 [id number]   Final   Lab on 11/26/2023   Component Date Value Ref Range Status    Iron  11/26/2023 44 (L)  50 - 170 ug/dL Final    TIBC 89/79/7974 253  250 - 425 ug/dL Final    Iron  Saturation (%) 11/26/2023 17 (L)  20 - 55 % Final    Ferritin 11/26/2023 94.9  7.3 - 270.7 ng/mL Final    Antibody Screen 11/26/2023 NEG   Final    ABO Grouping 11/26/2023 B POS   Final    WBC 11/26/2023 15.6 (H)  3.6 - 11.2 10*9/L Final    RBC 11/26/2023 4.05  3.95 - 5.13 10*12/L Final    HGB 11/26/2023 10.7 (L)  11.3 - 14.9 g/dL Final    HCT 89/79/7974 34.1  34.0 - 44.0 % Final    MCV 11/26/2023 84.1  77.6 - 95.7 fL Final    MCH 11/26/2023 26.5  25.9 - 32.4 pg Final    MCHC 11/26/2023 31.5 (L)  32.0 - 36.0 g/dL Final    RDW 89/79/7974 20.8 (H)  12.2 - 15.2 % Final    MPV 11/26/2023 10.0  6.8 - 10.7 fL Final    Platelet 11/26/2023 16 (L)  150 - 450 10*9/L Final    nRBC 11/26/2023 6 (H)  <=4 /100 WBCs Final    Neutrophils % 11/26/2023 71.3  % Final    Lymphocytes % 11/26/2023 12.8  % Final    Monocytes % 11/26/2023 2.3  % Final    Eosinophils % 11/26/2023 7.2  % Final    Basophils % 11/26/2023 6.4  % Final    Absolute Neutrophils 11/26/2023 11.1 (H)  1.8 - 7.8 10*9/L Final    Absolute Lymphocytes 11/26/2023 2.0  1.1 - 3.6 10*9/L Final    Absolute Monocytes 11/26/2023 0.4  0.3 - 0.8 10*9/L Final    Absolute Eosinophils 11/26/2023 1.1 (H)  0.0 - 0.5 10*9/L Final    Absolute Basophils 11/26/2023 1.0 (H)  0.0 - 0.1 10*9/L Final    Anisocytosis 11/26/2023 Moderate (A)  Not Present Final    Hypochromasia 11/26/2023 Slight (A)  Not Present Final    Smear Review Comments 11/26/2023 See Comment  Undefined Final    Myelocytes present.  Promyelocytes present.  [id number]    Polychromasia 11/26/2023 Slight (A)  Not Present Final    Basophilic Stippling 11/26/2023 Present (A)  Not Present Final    Toxic Vacuolation 11/26/2023 Present (A)  Not Present Final    Neutrophil Left Shift 11/26/2023 Present (A)  Not Present Final          RED ZONE Means: RED ZONE: Take action now!     You need to be seen right away  Symptoms are at a severe level of discomfort    Call 911 or go to your nearest  Hospital for help     - Bleeding that will not stop    - Hard to breathe    - New seizure - Chest pain  - Fall or passing out  -Thoughts of hurting    yourself or others  Call 911 if you are going into the RED ZONE                  YELLOW ZONE Means:     Please call with any new or worsening symptom(s), even if not on this list.  Call [phone]  After hours, weekends, and holidays - you will reach a long recording with specific instructions, If not in an emergency such as above, please listen closely all the way to the end and choose the option that relates to your need.   You can be seen by a provider the same day through our Same Day Acute Care for Patients with Cancer program.      YELLOW ZONE: Take action today     Symptoms are new or worsening  You are not within your goal range for:    - Pain    - Shortness of breath    - Bleeding (nose, urine, stool, wound)    - Feeling sick to your stomach and throwing up    - Mouth sores/pain in your mouth or throat    - Hard stool or very loose stools (increase in       ostomy output)    - No urine for 12 hours    - Feeding tube or other catheter/tube issue    - Redness or pain at previous IV or port/catheter site    - Depressed or anxiety   - Swelling (leg, arm, abdomen,     face, neck)  - Skin rash or skin changes  - Wound issues (redness, drainage,    re-opened)  - Confusion  - Vision changes  - Fever >100.4 F or chills  - Worsening cough with mucus that is    green, yellow, or bloody  - Pain or burning when going to the    bathroom  - Home Infusion Pump Issue- call    [phone]         Call your healthcare provider if you are going into the YELLOW ZONE     GREEN ZONE Means:  Your symptoms are under controls  Continue to take your medicine as ordered  Keep all visits to the provider GREEN ZONE: You are in control  No increase or worsening symptoms  Able to take your medicine  Able to drink and eat    - DO NOT use MyChart messages to report red or yellow symptoms. Allow up to 3    business days for a reply.  -MyChart is for non-urgent medication refills, scheduling requests, or other general questions.         [id number] Rev. 08/05/2021  Approved by Oncology Patient Education Committee     Hospital Outpatient Visit on 11/26/2023   Component Date Value Ref Range Status    PRODUCT CODE 11/26/2023 [id number]   Final    Product ID 11/26/2023 Platelets   Final    Status 11/26/2023 Transfused   Final    Unit # 11/26/2023 [id number]   Final    Unit Blood Type 11/26/2023 B Pos   Final    ISBT Number 11/26/2023 [id number]   Final   Lab on 11/26/2023   Component Date Value Ref Range Status    Iron  11/26/2023 44 (L)  50 - 170 ug/dL Final    TIBC 89/79/7974 253  250 - 425 ug/dL Final    Iron  Saturation (%) 11/26/2023 17 (L)  20 - 55 % Final    Ferritin 11/26/2023 94.9  7.3 -  270.7 ng/mL Final    Antibody Screen 11/26/2023 NEG   Final    ABO Grouping 11/26/2023 B POS   Final    WBC 11/26/2023 15.6 (H)  3.6 - 11.2 10*9/L Final    RBC 11/26/2023 4.05  3.95 - 5.13 10*12/L Final    HGB 11/26/2023 10.7 (L)  11.3 - 14.9 g/dL Final    HCT 89/79/7974 34.1  34.0 - 44.0 % Final    MCV 11/26/2023 84.1  77.6 - 95.7 fL Final    MCH 11/26/2023 26.5  25.9 - 32.4 pg Final    MCHC 11/26/2023 31.5 (L)  32.0 - 36.0 g/dL Final    RDW 89/79/7974 20.8 (H)  12.2 - 15.2 % Final    MPV 11/26/2023 10.0  6.8 - 10.7 fL Final    Platelet 11/26/2023 16 (L)  150 - 450 10*9/L Final    nRBC 11/26/2023 6 (H)  <=4 /100 WBCs Final    Neutrophils % 11/26/2023 71.3  % Final    Lymphocytes % 11/26/2023 12.8  % Final    Monocytes % 11/26/2023 2.3  % Final    Eosinophils % 11/26/2023 7.2  % Final    Basophils % 11/26/2023 6.4  % Final    Absolute Neutrophils 11/26/2023 11.1 (H)  1.8 - 7.8 10*9/L Final    Absolute Lymphocytes 11/26/2023 2.0  1.1 - 3.6 10*9/L Final    Absolute Monocytes 11/26/2023 0.4  0.3 - 0.8 10*9/L Final    Absolute Eosinophils 11/26/2023 1.1 (H)  0.0 - 0.5 10*9/L Final    Absolute Basophils 11/26/2023 1.0 (H)  0.0 - 0.1 10*9/L Final    Anisocytosis 11/26/2023 Moderate (A)  Not Present Final    Hypochromasia 11/26/2023 Slight (A)  Not Present Final    Smear Review Comments 11/26/2023 See Comment  Undefined Final    Myelocytes present.  Promyelocytes present.  [id number]    Polychromasia 11/26/2023 Slight (A)  Not Present Final    Basophilic Stippling 11/26/2023 Present (A)  Not Present Final    Toxic Vacuolation 11/26/2023 Present (A)  Not Present Final    Neutrophil Left Shift 11/26/2023 Present (A)  Not Present Final

## 2023-11-26 NOTE — Unmapped (Signed)
 I consented the patient to receive blood and blood products such as red blood cells, fresh frozen plasma, platelets or cryoprecipitate. The consent form was signed and witnessed.     The risks and benefits for receiving blood transfusion products were explained to the patient including but not limited to infusion reactions (anaphylaxis and/or febrile), infection (bacterial, hepatitis B &C, HIV, product contamination, other infections), acute immune hemolysis reaction or delayed hemolysis, transfusion related lung injury and/or development of antibodies . The patient gives consent to receive blood transfusion products as ordered and consent will remain on file according to hospital standards.  I mentioned that the patient can decide to stop transfusions at any time. All of the patient's questions were answered to his or her satisfaction. Emily Walter is alert and oriented, expressed understanding, and consented to proceed with the proposed treatment plan.     Eleanor Sensing, AGNP-BC  [facility]

## 2023-11-27 DIAGNOSIS — D7581 Myelofibrosis: Principal | ICD-10-CM

## 2023-11-28 DIAGNOSIS — G2581 Restless legs syndrome: Principal | ICD-10-CM

## 2023-11-28 MED FILL — OJJAARA 100 MG TABLET: ORAL | 30 days supply | Qty: 30 | Fill #2

## 2023-11-28 NOTE — Unmapped (Signed)
 Called and spoke with patient. She is not having issues with RLS and is no longer using Requip .

## 2023-11-28 NOTE — Unmapped (Signed)
 Pharmacy called and requested refill of ropinirole .  This med is not on last AVS and not on med list.  Rec?

## 2023-12-09 ENCOUNTER — Inpatient Hospital Stay: Admit: 2023-12-09 | Discharge: 2023-12-09 | Payer: Medicare (Managed Care)

## 2023-12-09 ENCOUNTER — Encounter
Admit: 2023-12-09 | Discharge: 2023-12-09 | Payer: Medicare (Managed Care) | Attending: Adult Health | Primary: Adult Health

## 2023-12-09 LAB — CBC W/ AUTO DIFF
BASOPHILS ABSOLUTE COUNT: 0.6 10*9/L — ABNORMAL HIGH (ref 0.0–0.1)
BASOPHILS RELATIVE PERCENT: 2.9 %
EOSINOPHILS ABSOLUTE COUNT: 1.3 10*9/L — ABNORMAL HIGH (ref 0.0–0.5)
EOSINOPHILS RELATIVE PERCENT: 6.6 %
HEMATOCRIT: 38.4 % (ref 34.0–44.0)
HEMOGLOBIN: 12.2 g/dL (ref 11.3–14.9)
LYMPHOCYTES ABSOLUTE COUNT: 2.5 10*9/L (ref 1.1–3.6)
LYMPHOCYTES RELATIVE PERCENT: 13.4 %
MEAN CORPUSCULAR HEMOGLOBIN CONC: 31.7 g/dL — ABNORMAL LOW (ref 32.0–36.0)
MEAN CORPUSCULAR HEMOGLOBIN: 26.2 pg (ref 25.9–32.4)
MEAN CORPUSCULAR VOLUME: 82.5 fL (ref 77.6–95.7)
MEAN PLATELET VOLUME: 10.9 fL — ABNORMAL HIGH (ref 6.8–10.7)
MONOCYTES ABSOLUTE COUNT: 0.5 10*9/L (ref 0.3–0.8)
MONOCYTES RELATIVE PERCENT: 2.4 %
NEUTROPHILS ABSOLUTE COUNT: 13.7 10*9/L — ABNORMAL HIGH (ref 1.8–7.8)
NEUTROPHILS RELATIVE PERCENT: 74.7 %
NUCLEATED RED BLOOD CELLS: 6 /100{WBCs} — ABNORMAL HIGH (ref <=4)
PLATELET COUNT: 18 10*9/L — ABNORMAL LOW (ref 150–450)
RED BLOOD CELL COUNT: 4.66 10*12/L (ref 3.95–5.13)
RED CELL DISTRIBUTION WIDTH: 20.4 % — ABNORMAL HIGH (ref 12.2–15.2)
WBC ADJUSTED: 18.4 10*9/L — ABNORMAL HIGH (ref 3.6–11.2)

## 2023-12-09 LAB — SLIDE REVIEW

## 2023-12-09 NOTE — Procedures (Signed)
 VENOUS ACCESS TEAM PROCEDURE    Nurse request was placed for a PIV by Venous Access Team (VAT).  Patient was assessed at bedside for placement of a PIV. PPE were donned per protocol.  Access was obtained. Blood return noted.  Dressing intact and device well secured.  Flushed with normal saline.  See LDA for details.  Pt advised to inform RN of any s/s of discomfort at the PIV site.    Workup / Procedure Time:  30 minutes       Care RN was notified.       Thank you,     Gillie Manners, RN Venous Access Team

## 2023-12-09 NOTE — Progress Notes (Signed)
 Pt presents to the clinic, Chair 4 for the following???  Treatment: lab check      Data:   vital signs stable, review of systems within parameters,  adverse drug reactions reviewed, labs reviewed. IV access very difficult to obtain. VAT team utilized. Talked with pt about looking into other access options with her team.    Action:  Vat team obtained access. Labs within parameters no transfusion needed    Response:  Pt tolerated well. No reaction noted  No complications noted, positive blood return, no redness or edema at PIV site. PIV removed  AVS was declined.  Pt was discharged in stable condition.

## 2023-12-09 NOTE — Patient Instructions (Signed)
 Hospital Outpatient Visit on 12/09/2023   Component Date Value Ref Range Status    RBC 12/09/2023 4.66  3.95 - 5.13 10*12/L Preliminary    HGB 12/09/2023 12.2  11.3 - 14.9 g/dL Preliminary    HCT 88/97/7974 38.4  34.0 - 44.0 % Preliminary    MCV 12/09/2023 82.5  77.6 - 95.7 fL Preliminary    MCH 12/09/2023 26.2  25.9 - 32.4 pg Preliminary    MCHC 12/09/2023 31.7 (L)  32.0 - 36.0 g/dL Preliminary    RDW 88/97/7974 20.4 (H)  12.2 - 15.2 % Preliminary    MPV 12/09/2023 10.9 (H)  6.8 - 10.7 fL Preliminary    Platelet 12/09/2023 18 (L)  150 - 450 10*9/L Preliminary    Anisocytosis 12/09/2023 Moderate (A)  Not Present Preliminary    Hypochromasia 12/09/2023 Slight (A)  Not Present Preliminary

## 2023-12-13 DIAGNOSIS — I10 Essential (primary) hypertension: Principal | ICD-10-CM

## 2023-12-13 DIAGNOSIS — E7800 Pure hypercholesterolemia, unspecified: Principal | ICD-10-CM

## 2023-12-13 MED ORDER — LOVASTATIN 40 MG TABLET
ORAL_TABLET | Freq: Every day | ORAL | 3 refills | 0.00000 days
Start: 2023-12-13 — End: ?

## 2023-12-13 MED ORDER — FUROSEMIDE 40 MG TABLET
ORAL_TABLET | Freq: Every day | ORAL | 3 refills | 0.00000 days
Start: 2023-12-13 — End: ?

## 2023-12-13 MED ORDER — SPIRONOLACTONE 25 MG TABLET
ORAL_TABLET | Freq: Every day | ORAL | 3 refills | 0.00000 days
Start: 2023-12-13 — End: ?

## 2023-12-13 MED ORDER — LEVOCETIRIZINE 5 MG TABLET
ORAL_TABLET | Freq: Every evening | ORAL | 3 refills | 0.00000 days
Start: 2023-12-13 — End: ?

## 2023-12-13 MED ORDER — ENALAPRIL MALEATE 5 MG TABLET
ORAL_TABLET | Freq: Every day | ORAL | 3 refills | 0.00000 days
Start: 2023-12-13 — End: ?

## 2023-12-14 DIAGNOSIS — D7581 Myelofibrosis: Principal | ICD-10-CM

## 2023-12-14 MED ORDER — LOVASTATIN 40 MG TABLET
ORAL_TABLET | Freq: Every day | ORAL | 2 refills | 90.00000 days | Status: CP
Start: 2023-12-14 — End: ?

## 2023-12-14 MED ORDER — ENALAPRIL MALEATE 5 MG TABLET
ORAL_TABLET | Freq: Every day | ORAL | 3 refills | 90.00000 days
Start: 2023-12-14 — End: ?

## 2023-12-14 MED ORDER — SPIRONOLACTONE 25 MG TABLET
ORAL_TABLET | Freq: Every day | ORAL | 2 refills | 90.00000 days | Status: CP
Start: 2023-12-14 — End: ?

## 2023-12-14 MED ORDER — FUROSEMIDE 40 MG TABLET
ORAL_TABLET | Freq: Every day | ORAL | 2 refills | 90.00000 days | Status: CP
Start: 2023-12-14 — End: ?

## 2023-12-14 MED ORDER — LEVOCETIRIZINE 5 MG TABLET
ORAL_TABLET | Freq: Every evening | ORAL | 2 refills | 90.00000 days | Status: CP
Start: 2023-12-14 — End: ?

## 2023-12-17 ENCOUNTER — Ambulatory Visit: Admit: 2023-12-17 | Discharge: 2023-12-18 | Payer: Medicare (Managed Care)

## 2023-12-17 DIAGNOSIS — Z6837 Body mass index (BMI) 37.0-37.9, adult: Principal | ICD-10-CM

## 2023-12-17 DIAGNOSIS — M47816 Spondylosis without myelopathy or radiculopathy, lumbar region: Principal | ICD-10-CM

## 2023-12-17 DIAGNOSIS — F1190 Opioid use, unspecified, uncomplicated: Principal | ICD-10-CM

## 2023-12-17 DIAGNOSIS — M5416 Radiculopathy, lumbar region: Principal | ICD-10-CM

## 2023-12-17 DIAGNOSIS — N1831 Stage 3a chronic kidney disease (CMS-HCC): Principal | ICD-10-CM

## 2023-12-17 DIAGNOSIS — E66812 Class 2 obesity with body mass index (BMI) of 37.0 to 37.9 in adult, unspecified obesity type, unspecified whether serious comorbidity present: Principal | ICD-10-CM

## 2023-12-17 NOTE — Progress Notes (Signed)
 Patient Name: Emily Walter  Date of Birth: 01-05-1951  Primary Care Physician: Marnie Emmie Dais, MD       Emily Walter was seen today for establish care.    Diagnoses and all orders for this visit:    Lumbar spondylosis    Lumbar radiculopathy    Chronic, continuous use of opioids    Stage 3a chronic kidney disease (CMS-HCC)    Class 2 obesity with body mass index (BMI) of 37.0 to 37.9 in adult, unspecified obesity type, unspecified whether serious comorbidity present    Assessment & Plan  Chronic low back pain with right-sided lumbar radiculopathy  Chronic low back pain with intermittent right-sided lumbar radiculopathy, exacerbated by prolonged sitting, standing, and riding. Pain occasionally radiates down the back of the right leg, stopping at the knee, with severity reaching 10/10. Current management includes oxycodone , lidocaine patches, and Voltaren  gel. Not a candidate at this time for injections due to thrombocytopenia. Physical therapy has not been pursued recently.  - Continue oxycodone , lidocaine patches, and Voltaren  gel for pain management. Will send in Oxycodone  10/325mg . Patient has moved and is in the process of getting a referral to another pain management closer to where she is living.   - Collect urine drug screen.    Gait instability and recurrent falls  Gait instability with recent falls resulting in injuries, associated with dizziness and unsteadiness. High risk of bleeding due to thrombocytopenia increases concern for falls.  - Encourage consistent use of walker to prevent falls.  - Educate on fall prevention strategies and importance of using assistive devices.    Primary myelofibrosis with secondary thrombocytopenia  Primary myelofibrosis with secondary thrombocytopenia. Under care at a cancer center with plans for infusions to improve blood counts. Ongoing care coordination between cancer center and primary care provider.     Chronic, continued use of opioids. Discussed with the patient in details risks of continued opioid intake including opioid hyperalgesia, and opioid-induced adrenal insufficiency with low cortisol levels.   - Pt has been seeing [facility] in [place] and has been given prescriptions to last at least through October. Will take over medication and make any adjustments needed, when pt needs refill. Was previous rx'd Oxycodone  10/325mg  3-4 times daily from [facility].    Stage 3a CKD- No NSAIDS.     Obesity- will benefit from diet, exercise and weight loss; activity currently limited by pain. BMI 37.73          Return in about 4 weeks (around 01/14/2024) for Recheck.     I have reviewed past medical, surgical, medication, allergy, social, and family histories today and updated them in Epic where appropriate.    Subjective:     Chief Compaint: Pain    HPI:   History of Present Illness  Emily Walter is a 73 year old female with myelofibrosis who presents with back pain and leg pain.    She experiences lower back pain that sometimes radiates down her right leg, stopping at the knee. The pain is severe, reaching a 9 out of 10 at its worst, and is exacerbated by prolonged sitting, standing, and riding in a vehicle. Currently, there is no back pain radiating down the legs, but past occurrences are noted.    She has a history of myelofibrosis and is under the care of a cancer center. She has previously required platelet and whole blood transfusions, particularly after kidney stone surgery in March of last year. She is not currently receiving  transfusions but is monitored by the cancer center.    She uses oxycodone  for pain management, originally prescribed for her back pain, along with lidocaine patches and Voltaren  gel. She has allergies to gabapentin, Lyrica, codeine, steroids, prednisone , and fentanyl.    She has experienced two recent falls, one resulting in a concussion, and reports dizziness and hearing loss in one ear. She uses a cane and has two walkers. No smoking, diabetes, or use of blood thinners. She has not undergone physical therapy recently, nor has she had back surgery or epidural injections.       PDMP Review Status:  I personally reviewed the PDMP which was remarkable for 56.7 Current MME/day     Imaging:  Impression   Moderate spinal canal stenosis at L4-L5 with narrowing of the lateral recess and impingement traversing bilateral L5 nerves, progressed since 2008.      Interval progression of neural foraminal narrowing at L4-L5, causing impingement of the exiting L4 nerves at this level.      Abnormal marrow signal consistent with myelofibrosis.     Narrative   EXAM: Magnetic resonance imaging, spinal canal and contents, lumbar, without contrast material.   DATE: 09/13/2020 4:53 PM   ACCESSION: [id number]   DICTATED: 09/23/2020 1:08 PM   INTERPRETATION LOCATION: [facility]      CLINICAL INDICATION: 73 years old Female with right sided sciatica ; Lumbar radiculopathy, > 6 wks  - M54.31 - Right sided sciatica        COMPARISON: MRI lumbar spine 05/21/2006.      TECHNIQUE: Multiplanar MRI was performed through the lumbar spine without intravenous contrast.      FINDINGS:   Diffuse T1 and T2 hypointense marrow signal consistent with myelofibrosis. The visualized cord is unremarkable and the conus medullaris ends at a normal level.      Dictated 1-2 anterolisthesis of L4 on L5. Disc desiccation with loss of disc height.      L1-L2: Disc bulge, ligamentum flavum hypertrophy and facet arthropathy. No spinal canal or neural foraminal narrowing.      L2-L3: Disc bulge with superimposed facet arthropathy, ligamentum flavum hypertrophy causes mild spinal canal narrowing. Disc contents contact the traversing L3 nerve bilaterally. No neural foraminal narrowing.      L3-L4: Disc bulge with superimposed facet arthropathy, ligamentum flavum hypertrophy causes mild spinal canal narrowing. Disc contents contact the traversing L4 nerves bilaterally.  No neural foraminal narrowing. L4-L5: Retrolisthesis with superimposed disc bulge, ligamentum flavum hypertrophy and facet arthropathy causes moderate spinal canal narrowing. The disc narrows bilateral lateral recesses impinging the traversing bilateral L5 nerves. Severe bilateral neural foraminal narrowing with impingement of the exiting L4 nerves.      L5-S1: No herniation. No spinal canal narrowing. No neural foraminal narrowing.      The paraspinal tissues are within normal limits.      For the purposes of this dictation, the lowest well formed intervertebral disc space is assumed to be the L5-S1 level, and there are presumed to be five lumbar-type vertebral bodies.          Pain Intervention History:  None     Opioid Risk Screening Tools:    PEG Pain Total Score: 6    ROS:  Review of systems negative unless otherwise noted as per HPI.    Allergies:   Fentanyl, Codeine, Corticosteroids (glucocorticoids), Gabapentin, Prednisone , and Pregabalin    Medications: Current Medications[1]    Past Medical History[2]  Past Surgical History[3]  Family History[4]  Short Social History[5]    Objective:       12/17/23 1352   BP: 130/77   Pulse: 81   Resp: 17   Temp: 36.1 ??C (97 ??F)   SpO2: 99%   Weight: 92.4 kg (203 lb 9.6 oz)   Height: 160 cm (5' 3)   PainSc: 6         Body mass index is 36.07 kg/m??.     Physical Exam:  Constitutional: Alert, no acute distress  HEENT: Normocephalic  Respiratory: Regular respiratory rate, non-labored breathing  Cardiovascular: Regular Rate. No focal peripheral edema in lower extremities  Musculoskeletal: Lumbar Spine with ROM restricted in flexion to 70 degrees, extension to 20 degrees. Focal TTP over lumbar paraspinal mm, no central bony TTP, stepoff, or deformity appreciated. SLR equivocal bilaterally. + pain with facet loading bilaterally.    Dermatologic: Skin warm and well perfused. No focal rash observed  Abdomen: non-distended  Neurologic: No focal motor or sensory deficits. Gait antalgic LE strength 4/5 B/L Psychiatric: Mood normal. Affect appropriate    Please note that this dictation was completed with Training and development officer. Quite often unanticipated grammatical and other interpretative errors are inadvertently transcribed by the computer software. Please regard these errors. Please excuse any errors that have escaped final proofreading.    Camelia KATHEE Curl, FNP   12/17/2023   8:14 AM           [1]   Current Outpatient Medications:     albuterol  HFA 90 mcg/actuation inhaler, Inhale 2 puffs every six (6) hours as needed for wheezing or shortness of breath., Disp: 8 g, Rfl: 3    cholecalciferol , vitamin D3-50 mcg, 2,000 unit,, 50 mcg (2,000 unit) tablet, Take 1 tablet (50 mcg total) by mouth in the morning., Disp: , Rfl:     diclofenac  sodium (VOLTAREN ) 1 % gel, Apply 4 g topically four (4) times a day as needed for arthritis or pain (Knee pain)., Disp: 100 g, Rfl: 2    fluticasone propionate (FLONASE) 50 mcg/actuation nasal spray, 2 sprays into each nostril daily as needed for allergies., Disp: , Rfl:     furosemide  (LASIX ) 40 MG tablet, Take 1 tablet (40 mg total) by mouth daily., Disp: 90 tablet, Rfl: 2    levocetirizine (XYZAL) 5 MG tablet, Take 1 tablet (5 mg total) by mouth every evening., Disp: 90 tablet, Rfl: 2    lidocaine (LIDODERM) 5 % patch, Place 1 patch on the skin daily. Apply to affected area for 12 hours only each day (then remove patch), Disp: , Rfl:     lovastatin  (MEVACOR ) 40 MG tablet, Take 1 tablet (40 mg total) by mouth daily., Disp: 90 tablet, Rfl: 2    miscellaneous medical supply Misc, , Disp: , Rfl:     momelotinib (OJJAARA ) 100 mg tablet, Take 1 tablet (100 mg total) by mouth daily. Swallow tablets whole; do not cut, crush, or chew. (Patient not taking: Reported on 11/21/2023), Disp: 30 tablet, Rfl: 2    NARCAN 4 mg/actuation nasal spray, 1 spray into alternating nostrils once as needed., Disp: , Rfl: 0    omeprazole  (PRILOSEC) 40 MG capsule, Take 1 capsule (40 mg total) by mouth daily., Disp: 90 capsule, Rfl: 3    ondansetron  (ZOFRAN -ODT) 4 MG disintegrating tablet, Dissolve 1 tablet (4 mg total) in the mouth every eight (8) hours as needed for nausea., Disp: 15 tablet, Rfl: 3    oxyCODONE -acetaminophen (PERCOCET) 10-325 mg per tablet, Take 1 tablet by  mouth every six (6) hours as needed for pain., Disp: , Rfl:     oxyCODONE -acetaminophen (PERCOCET) 10-325 mg per tablet, Take 1 tablet by mouth every 6-8 hours as needed for pain, Disp: 110 tablet, Rfl: 0    polyethylene glycol (GOLYTELY ) 236-22.74-6.74 gram solution, Take by mouth as directed per [facility] GI prep instructions, for split bowel prep. (Patient not taking: Reported on 11/21/2023), Disp: 4000 mL, Rfl: 0    sertraline  (ZOLOFT ) 25 MG tablet, Take 1 tablet (25 mg total) by mouth daily., Disp: 90 tablet, Rfl: 3    spironolactone  (ALDACTONE ) 25 MG tablet, Take 1 tablet (25 mg total) by mouth daily., Disp: 90 tablet, Rfl: 2    ULTRA-LIGHT ROLLATOR Misc, Use as directed., Disp: , Rfl: 0  [2]   Past Medical History:  Diagnosis Date    Anxiety     Arthritis     Asthma (HHS-HCC)     Cancer    (CMS-HCC)     Cataract     Chronic kidney disease     Depression     Difficult intravenous access     GERD (gastroesophageal reflux disease)     Heart failure (CMS-HCC)     HLD (hyperlipidemia)     Hypertension     Myelofibrosis    (CMS-HCC)     Nephrolithiasis     Sleep apnea     Tinnitus    [3]   Past Surgical History:  Procedure Laterality Date    HYSTERECTOMY      PR CYSTO/URETERO W/LITHOTRIPSY &INDWELL STENT INSRT Left 08/21/2018    Procedure: priority CYSTOURETHROSCOPY, WITH URETEROSCOPY AND/OR PYELOSCOPY; WITH LITHOTRIPSY INCLUDING INSERTION OF INDWELLING URETERAL STENT;  Surgeon: Nicholaus Mt Viprakasit, MD;  Location: CYSTO PROCEDURE SUITES [facility];  Service: Urology    PR CYSTO/URETERO/PYELOSCOPY, CALCULUS TX Left 09/04/2018    Procedure: CYSTOURETHROSCOPY, W/URETEROSCOPY &/OR PYELOSCOPY; W/REMOVAL/MANIPULATION CALCULUS(URETERAL CATH INCLUDED);  Surgeon: Nicholaus Mt Viprakasit, MD;  Location: CYSTO PROCEDURE SUITES [facility];  Service: Urology    PR CYSTOSCOPY,INSERT URETERAL STENT Left 05/04/2022    Procedure: CYSTOURETHROSCOPY,  WITH INSERTION OF INDWELLING URETERAL STENT (EG, GIBBONS OR DOUBLE-J TYPE);  Surgeon: Drena Darryle Gull, MD;  Location: MAIN OR [facility];  Service: Urology    PR CYSTOSCOPY,REMV CALCULUS,SIMPLE Left 09/04/2018    Procedure: CYSTOURETHROSCOPY, WITH REMOVAL OF FOREIGN BODY, CALCULUS OR URETERAL STENT FROM URETHRA OR BLADDER; SIMPLE;  Surgeon: Nicholaus Mt Viprakasit, MD;  Location: CYSTO PROCEDURE SUITES [facility];  Service: Urology    PR CYSTOURETHROSCOPY,URETER CATHETER Left 08/21/2018    Procedure: CYSTOURETHROSCOPY, W/URETERAL CATHETERIZATION, W/WO IRRIG, INSTILL, OR URETEROPYELOG, EXCLUS OF RADIOLG SVC;  Surgeon: Nicholaus Mt Viprakasit, MD;  Location: CYSTO PROCEDURE SUITES [facility];  Service: Urology    PR CYSTOURETHROSCOPY,URETER CATHETER Left 09/04/2018    Procedure: CYSTOURETHROSCOPY, W/URETERAL CATHETERIZATION, W/WO IRRIG, INSTILL, OR URETEROPYELOG, EXCLUS OF RADIOLG SVC;  Surgeon: Nicholaus Mt Viprakasit, MD;  Location: CYSTO PROCEDURE SUITES [place];  Service: Urology    PR PERQ DILATION XST TRC NEW ACCESS RENAL COLTJ SYS Left 04/24/2022    Procedure: DILATION OF EXISTING TRACT, PERC, FOR ENDOUROLOGIC PROC INCL IMAGING GUIDANCE & RADIOLOGIC SUPERVISION & INTERPRET, W POSTPROC TUBE PLACEMENT, WHEN PERF; INCL NEW ACCESS INTO RENAL COLLECTING SYSTEM;  Surgeon: Mitch Alm Pickles, MD;  Location: [facility] OR [facility];  Service: Urology    PR PERQ NL/PL LITHOTRP COMPLEX >2 CM MLT LOCATIONS Left 04/24/2022    Procedure: standard PCNL prone vs. standard; need fortec;  Surgeon: Mitch Alm Pickles, MD;  Location: [facility] OR [facility];  Service:  Urology    PR REMOVE BLADDER STONE,<2.5 CM N/A 08/21/2018    Procedure: LITHOLAPAXY: CRUSH/FRAGMENT CALCULUS, BY ANY MEANS IN BLADDER & REMOVAL OF FRAGMENTS; SIMPLE/SMALL <2.5 CM;  Surgeon: Nicholaus Mt Viprakasit, MD;  Location: CYSTO PROCEDURE SUITES [facility];  Service: Urology    SALPINGOOPHORECTOMY Right     12/07/2010    VAGINAL HYSTERECTOMY      12/07/2010   [4]   Family History  Problem Relation Age of Onset    Hypertension Mother     Cancer Brother         bone    Hypertension Brother     Hypertension Brother     Hypertension Brother     Anesthesia problems Neg Hx     Bleeding Disorder Neg Hx     Breast cancer Neg Hx     Ovarian cancer Neg Hx     Colon cancer Neg Hx     Endometrial cancer Neg Hx    [5]   Social History  Tobacco Use    Smoking status: Never    Smokeless tobacco: Never   Vaping Use    Vaping status: Never Used   Substance Use Topics    Alcohol use: No    Drug use: No

## 2023-12-18 DIAGNOSIS — D7581 Myelofibrosis: Principal | ICD-10-CM

## 2023-12-18 LAB — SPECIFIC GRAVITY/PH
PH UA: 5 (ref 5.0–9.0)
SPECIFIC GRAVITY UA: 1.01 — ABNORMAL LOW (ref 1.016–1.022)

## 2023-12-18 LAB — DRUG SCREEN, PAIN CLINIC, URINE
AMPHETAMINE SCREEN URINE: NEGATIVE
BARBITURATE SCREEN URINE: NEGATIVE
BENZODIAZEPINE SCREEN, URINE: NEGATIVE
BUPRENORPHINE, URINE SCREEN: NEGATIVE
CANNABINOID SCREEN URINE: NEGATIVE
COCAINE(METAB.)SCREEN, URINE: NEGATIVE
FENTANYL SCREEN, URINE: NEGATIVE
METHADONE SCREEN, URINE: NEGATIVE
OPIATE SCREEN URINE: NEGATIVE
OXYCODONE SCREEN URINE: POSITIVE — AB

## 2023-12-18 MED ORDER — OXYCODONE-ACETAMINOPHEN 10 MG-325 MG TABLET
ORAL_TABLET | ORAL | 0 refills | 0.00000 days | Status: CP
Start: 2023-12-18 — End: ?

## 2023-12-20 ENCOUNTER — Encounter: Admit: 2023-12-20 | Discharge: 2023-12-20 | Payer: Medicare (Managed Care)

## 2023-12-20 DIAGNOSIS — N1832 Stage 3b chronic kidney disease (CMS-HCC): Principal | ICD-10-CM

## 2023-12-20 DIAGNOSIS — R110 Nausea: Principal | ICD-10-CM

## 2023-12-20 DIAGNOSIS — C946 Myelodysplastic disease, not classified: Principal | ICD-10-CM

## 2023-12-20 DIAGNOSIS — I10 Essential (primary) hypertension: Principal | ICD-10-CM

## 2023-12-20 DIAGNOSIS — D7581 Myelofibrosis: Principal | ICD-10-CM

## 2023-12-20 DIAGNOSIS — E785 Hyperlipidemia, unspecified: Principal | ICD-10-CM

## 2023-12-20 LAB — ALBUMIN / CREATININE URINE RATIO
ALBUMIN QUANT URINE: 2 mg/dL
ALBUMIN/CREATININE RATIO: 53.5 ug/mg — ABNORMAL HIGH (ref 0.0–30.0)
CREATININE, URINE: 37.4 mg/dL

## 2023-12-20 MED ORDER — ONDANSETRON HCL 4 MG TABLET
ORAL_TABLET | Freq: Two times a day (BID) | ORAL | 2 refills | 15.00000 days | Status: CN | PRN
Start: 2023-12-20 — End: 2024-12-19

## 2023-12-20 MED ORDER — ONDANSETRON 4 MG DISINTEGRATING TABLET
ORAL_TABLET | Freq: Three times a day (TID) | 2 refills | 10.00000 days | Status: CP | PRN
Start: 2023-12-20 — End: 2024-12-19

## 2023-12-20 MED ORDER — OJJAARA 100 MG TABLET
ORAL_TABLET | Freq: Every day | ORAL | 2 refills | 30.00000 days
Start: 2023-12-20 — End: ?

## 2023-12-20 NOTE — Progress Notes (Signed)
 Internal Medicine Clinic Visit    Reason for visit: Annual preventative visit    A/P:      Emily Walter was seen today for follow-up.    Diagnoses and all orders for this visit:    Routine general medical examination at a health care facility  Advance care planning  Needs shingles vaccine. Overdue for colon cancer screening however not sure she has 10+ year life expectancy - I've sent a message to oncology to ask. Did discuss code status today, and she has decided to be DNR.  - fill out MOST form next visit    Nausea  Had some nausea/vomiting last night. Nausea is an intermittent issue.  - refilled zofran     MDS/MPN (myelodysplastic/myeloproliferative neoplasms)    (CMS-HCC)  Follows w/ oncology. Was recently restarted on momelotinib. Has been getting platelet transfusions.     Stage 3b chronic kidney disease (CMS-HCC)  Labs have been stable. Had proteinuria today for first time - if this persists will refer to nephrology, and discuss starting an ARB. With recurrent UTIs would not start SGLT2 inhibitor.  -     Albumin/creatinine urine ratio; Future  -     Albumin/creatinine urine ratio    White coat syndrome with diagnosis of hypertension  Near goal.  - continue spironolactone     Hyperlipidemia, unspecified hyperlipidemia type  LDL 66 in April 2023.  -     Lipid Panel; Future    Lumbar stenosis with neurogenic claudication  Lumbar radiculopathy  Chronic back pain. Had been seen at [facility] pain clinic in [place] but since moving will need a closer pain provider.  -     Pain Clinic; Future - referral sent to [facility] in [place]    Dizziness  Ongoing issue for nearly 2 years. Workup has been unrevealing. Still follows w/ ENT. May be side effect of cancer med.  - continue to monitor    Other orders  -     DNR (Do Not Resuscitate)  -     COVID-19 VAC, (51YR+) (COMIRNATY) MRNA PFIZER   -     ondansetron  (ZOFRAN -ODT) 4 MG disintegrating tablet; Dissolve 1 tablet (4 mg total) in the mouth every eight (8) hours as needed for nausea.        Return in about 4 months (around 04/18/2024).      __________________________________________________________    HPI:    Last visit w/ me in June. Seen in our clinic 9/3 for acute L knee pain - x-ray showed OA. Seen at spine center 11/10. Saw heme 10/15, urology 10/8.    Has been nausous on and off. Occasionally.   Ate steak and mashed potatoes.  Threw up last night  Noticed some small red spots around eye this morning. Had strained with vomiting.    No recent fevers  No recent abdominal pain - last time was last week  No dysuria or increased urinary frequency  No hematuria or hematochezia    Moved to [place]   Needs to see new pain doctor around [place], [place]. Current one is in [place] which is far. Sees them every 2 months     No longer has knee pain  Main issue is back pain  Meds are helping    Takes an OTC laxative nightly  Has a daily BM    Sometimes gets a blister inside her mouth. Thinks it is coming from one of her cancer meds    Last time she received platelets was ~4 weeks ago  No chest pain  No SOB  No leg swelling     Still has dizziness. Nearly constant. Feels off balance. No room spinning.   And doesn't have hearing in one ear  Uses a cane at times  Had at least two falls in the last few months   __________________________________________________________    Problem List:  Problem List[1]    Medications:  Reviewed in EPIC  __________________________________________________________    Physical Exam:   Vital Signs:  Vitals:    12/20/23 1236   BP: 132/75   BP Site: L Arm   BP Position: Sitting   BP Cuff Size: Large   Pulse: 75   Temp: 36.5 ??C (97.7 ??F)   TempSrc: Temporal   SpO2: 99%   Weight: 91.8 kg (202 lb 4.8 oz)   Height: 160 cm (5' 3)       Gen: Female sitting in chair in NAD.  HEENT: [place]/AT. Sclera anicteric.   CV: RRR, no murmurs appreciated.  Pulm: Normal WOB. CTAB.  Abd: Soft, NTND.  Skin: Scattered petechiae around eyes.  Ext: No pitting edema.      Medication adherence and barriers to the treatment plan have been addressed. Opportunities to optimize healthy behaviors have been discussed. Patient / caregiver voiced understanding.        [name], MD         [1]   Patient Active Problem List  Diagnosis    White coat syndrome with diagnosis of hypertension    Mixed urge and stress incontinence    Restless leg syndrome    Depression    Myelofibrosis    (CMS-HCC)    GERD (gastroesophageal reflux disease)    History of nephrolithiasis    Stage 3b chronic kidney disease (CMS-HCC)    Lumbar stenosis with neurogenic claudication    Osteoarthritis    Thrombocytopenia    Hyperlipidemia    Severe obstructive sleep apnea    Chronic heart failure with preserved ejection fraction (CMS-HCC)    Prediabetes    Calculus of kidney    Iron  deficiency anemia    Flank pain    Gallstones    Recurrent UTI    MDS/MPN (myelodysplastic/myeloproliferative neoplasms)    (CMS-HCC)    Splenomegaly    Dizziness

## 2023-12-20 NOTE — Progress Notes (Signed)
 Emily Walter has been contacted in regards to their refill of momelotinib: OJJAARA  100 mg tablet. At this time, they have declined refill due to 20 days of medication . Refill assessment call date has been updated per the patient's request.

## 2023-12-20 NOTE — Patient Instructions (Addendum)
 Thank you for coming today!    [employer] medicare advantage will no longer be in network with [facility], so please switch to another plan if you can.    Please get the shingles vaccine at the pharmacy.

## 2023-12-20 NOTE — Progress Notes (Signed)
 [facility] at [facility]     Reason for visit: Follow up    Questions / Concerns that need to be addressed: pt is here for a follow up and a possible kidney infection     Screening BP- 132/75 80      PTHomeBP     HCDM reviewed and updated in Epic:    We are working to make sure all of our patients??? wishes are updated in Epic and part of that is documenting a Environmental Health Practitioner for each patient  A Health Care Decision Maker is someone you choose who can make health care decisions for you if you are not able - who would you most want to do this for you????  is already up to date.    HCDM (patient stated preference): Trachelle, Low - Spouse - [phone]    HCDM, First AlternateBETHA Zachary Heck - Son [phone]

## 2023-12-20 NOTE — Telephone Encounter (Signed)
 Please refill if appropriate.     Most recent clinic visit: 11/21/2023  Next clinic visit: Visit date not found

## 2023-12-24 ENCOUNTER — Encounter: Admit: 2023-12-24 | Discharge: 2023-12-25 | Payer: Medicare (Managed Care)

## 2023-12-24 ENCOUNTER — Ambulatory Visit: Admit: 2023-12-24 | Discharge: 2023-12-25 | Payer: Medicare (Managed Care)

## 2023-12-24 ENCOUNTER — Encounter
Admit: 2023-12-24 | Discharge: 2023-12-25 | Payer: Medicare (Managed Care) | Attending: Adult Health | Primary: Adult Health

## 2023-12-24 DIAGNOSIS — D7581 Myelofibrosis: Principal | ICD-10-CM

## 2023-12-24 DIAGNOSIS — D696 Thrombocytopenia, unspecified: Principal | ICD-10-CM

## 2023-12-24 DIAGNOSIS — E785 Hyperlipidemia, unspecified: Principal | ICD-10-CM

## 2023-12-24 LAB — SLIDE REVIEW

## 2023-12-24 LAB — CBC W/ AUTO DIFF
BASOPHILS ABSOLUTE COUNT: 0.3 10*9/L — ABNORMAL HIGH (ref 0.0–0.1)
BASOPHILS RELATIVE PERCENT: 2 %
EOSINOPHILS ABSOLUTE COUNT: 0.9 10*9/L — ABNORMAL HIGH (ref 0.0–0.5)
EOSINOPHILS RELATIVE PERCENT: 6.7 %
HEMATOCRIT: 34.2 % (ref 34.0–44.0)
HEMOGLOBIN: 10.9 g/dL — ABNORMAL LOW (ref 11.3–14.9)
LYMPHOCYTES ABSOLUTE COUNT: 1.9 10*9/L (ref 1.1–3.6)
LYMPHOCYTES RELATIVE PERCENT: 13.8 %
MEAN CORPUSCULAR HEMOGLOBIN CONC: 31.9 g/dL — ABNORMAL LOW (ref 32.0–36.0)
MEAN CORPUSCULAR HEMOGLOBIN: 26.3 pg (ref 25.9–32.4)
MEAN CORPUSCULAR VOLUME: 82.5 fL (ref 77.6–95.7)
MEAN PLATELET VOLUME: 10.2 fL (ref 6.8–10.7)
MONOCYTES ABSOLUTE COUNT: 0.3 10*9/L (ref 0.3–0.8)
MONOCYTES RELATIVE PERCENT: 2.3 %
NEUTROPHILS ABSOLUTE COUNT: 10.5 10*9/L — ABNORMAL HIGH (ref 1.8–7.8)
NEUTROPHILS RELATIVE PERCENT: 75.2 %
NUCLEATED RED BLOOD CELLS: 2 /100{WBCs} (ref <=4)
PLATELET COUNT: 16 10*9/L — ABNORMAL LOW (ref 150–450)
RED BLOOD CELL COUNT: 4.14 10*12/L (ref 3.95–5.13)
RED CELL DISTRIBUTION WIDTH: 20.7 % — ABNORMAL HIGH (ref 12.2–15.2)
WBC ADJUSTED: 14 10*9/L — ABNORMAL HIGH (ref 3.6–11.2)

## 2023-12-24 LAB — LIPID PANEL
CHOLESTEROL: 128 mg/dL (ref <200)
HDL CHOLESTEROL: 22 mg/dL — ABNORMAL LOW (ref >50)
LDL CHOLESTEROL CALCULATED: 74 mg/dL (ref <100)
NON-HDL CHOLESTEROL: 106 mg/dL (ref <130)
TRIGLYCERIDES: 219 mg/dL — ABNORMAL HIGH (ref <150)

## 2023-12-24 LAB — OXYCODONE METABOLITE, CONF, URINE
OXYCODONE (GC/MS): 418 ng/mL — ABNORMAL HIGH (ref <25)
OXYCODONE INTERP: POSITIVE
OXYMORPHONE: 875 ng/mL — ABNORMAL HIGH (ref <25)

## 2023-12-24 NOTE — Progress Notes (Signed)
 Access:  PIV    Labs: Outside of parameters    Patient reported new or worsening symptoms (if yes, explanation needed): No    Provider Notified: N/A    Ok to Treat given: N/A    Dignicap: N/A    Treatment Administered : Parameters not met for transfusions. Patient educated regarding lab results and plan of care. PIV removed and patient discharged.    Patient Education Performed (if yes,explanation needed): Yes, Plan of care and symptom management    Patient/Patient's Representative verbalized understanding of the education provided, with no additional questions or concerns at this time. :Yes.    Time Spent on Patient Education: 5 min    Pre-Chemotherapy Blood Return : N/A    Hypersensitivity Reaction (if yes, explanation needed): No    Post Chemotherapy Blood Return: N/A    Heparin Locked: N/A    Dressing: gauze/coban applied to PIV removal site    Stable Discharge (if no explanation needed): Yes

## 2023-12-25 NOTE — Progress Notes (Signed)
 [facility] Refill Coordination Note    Specialty Medication(s) to be Shipped:   Hematology/Oncology: OJJAARA  100 mg tablet (momelotinib)    Other medication(s) to be shipped: No additional medications requested for fill at this time    Specialty Medications not needed at this time: N/A     Emily Walter, DOB: [birth year]  Phone: There are no phone numbers on file.      All above HIPAA information was verified with patient.     Was a nurse, learning disability used for this call? No    Completed refill call assessment today to schedule patient's medication shipment from the [facility]  [phone]).  All relevant notes have been reviewed.     Specialty medication(s) and dose(s) confirmed: Regimen is correct and unchanged.   Changes to medications: [place] reports no changes at this time.  Changes to insurance: No  New side effects reported not previously addressed with a pharmacist or physician: None reported  Questions for the pharmacist: No    Confirmed patient received a Conservation Officer, Historic Buildings and a Surveyor, Mining with first shipment. The patient will receive a drug information handout for each medication shipped and additional FDA Medication Guides as required.       DISEASE/MEDICATION-SPECIFIC INFORMATION        N/A    SPECIALTY MEDICATION ADHERENCE     Medication Adherence    Patient reported X missed doses in the last month: 0  Specialty Medication: momelotinib: OJJAARA  100 mg tablet  Patient is on additional specialty medications: No              Were doses missed due to medication being on hold? No    OJJAARA  100 mg tablet (momelotinib): 14 days of medicine on hand       REFERRAL TO PHARMACIST     Referral to the pharmacist: Not needed      [facility]     Shipping address confirmed in Epic.     Cost and Payment: Patient has a $0 copay, payment information is not required.    Delivery Scheduled: Yes, Expected medication delivery date: 01/02/24.     Medication will be delivered via UPS to the prescription address in Epic WAM.    Emily Walter   [facility]  Specialty Technician

## 2023-12-26 DIAGNOSIS — D7581 Myelofibrosis: Principal | ICD-10-CM

## 2023-12-26 MED ORDER — MOMELOTINIB 100 MG TABLET
ORAL_TABLET | Freq: Every day | ORAL | 5 refills | 30.00000 days | Status: CP
Start: 2023-12-26 — End: ?
  Filled 2024-01-01: qty 30, 30d supply, fill #0

## 2024-01-11 ENCOUNTER — Encounter: Admit: 2024-01-11 | Discharge: 2024-01-12 | Payer: Medicare (Managed Care)

## 2024-01-11 ENCOUNTER — Encounter
Admit: 2024-01-11 | Discharge: 2024-01-12 | Payer: Medicare (Managed Care) | Attending: Adult Health | Primary: Adult Health

## 2024-01-11 ENCOUNTER — Ambulatory Visit: Admit: 2024-01-11 | Discharge: 2024-01-12 | Payer: Medicare (Managed Care)

## 2024-01-11 DIAGNOSIS — D696 Thrombocytopenia, unspecified: Principal | ICD-10-CM

## 2024-01-11 DIAGNOSIS — D7581 Myelofibrosis: Principal | ICD-10-CM

## 2024-01-11 LAB — CBC W/ AUTO DIFF
BASOPHILS ABSOLUTE COUNT: 0.3 10*9/L — ABNORMAL HIGH (ref 0.0–0.1)
BASOPHILS RELATIVE PERCENT: 2 %
EOSINOPHILS ABSOLUTE COUNT: 0.9 10*9/L — ABNORMAL HIGH (ref 0.0–0.5)
EOSINOPHILS RELATIVE PERCENT: 6.3 %
HEMATOCRIT: 33.1 % — ABNORMAL LOW (ref 34.0–44.0)
HEMOGLOBIN: 10.4 g/dL — ABNORMAL LOW (ref 11.3–14.9)
LYMPHOCYTES ABSOLUTE COUNT: 2.5 10*9/L (ref 1.1–3.6)
LYMPHOCYTES RELATIVE PERCENT: 17.4 %
MEAN CORPUSCULAR HEMOGLOBIN CONC: 31.5 g/dL — ABNORMAL LOW (ref 32.0–36.0)
MEAN CORPUSCULAR HEMOGLOBIN: 26.4 pg (ref 25.9–32.4)
MEAN CORPUSCULAR VOLUME: 83.6 fL (ref 77.6–95.7)
MEAN PLATELET VOLUME: 10.2 fL (ref 6.8–10.7)
MONOCYTES ABSOLUTE COUNT: 0.4 10*9/L (ref 0.3–0.8)
MONOCYTES RELATIVE PERCENT: 2.5 %
NEUTROPHILS ABSOLUTE COUNT: 10.2 10*9/L — ABNORMAL HIGH (ref 1.8–7.8)
NEUTROPHILS RELATIVE PERCENT: 71.8 %
NUCLEATED RED BLOOD CELLS: 3 /100{WBCs} (ref <=4)
PLATELET COUNT: 18 10*9/L — ABNORMAL LOW (ref 150–450)
RED BLOOD CELL COUNT: 3.96 10*12/L (ref 3.95–5.13)
RED CELL DISTRIBUTION WIDTH: 20.6 % — ABNORMAL HIGH (ref 12.2–15.2)
WBC ADJUSTED: 14.1 10*9/L — ABNORMAL HIGH (ref 3.6–11.2)

## 2024-01-11 LAB — SLIDE REVIEW

## 2024-01-11 NOTE — Patient Instructions (Signed)
 Results for orders placed or performed in visit on 01/11/24   CBC w/ Differential   Result Value Ref Range    WBC 14.1 (H) 3.6 - 11.2 10*9/L    RBC 3.96 3.95 - 5.13 10*12/L    HGB 10.4 (L) 11.3 - 14.9 g/dL    HCT 66.8 (L) 65.9 - 44.0 %    MCV 83.6 77.6 - 95.7 fL    MCH 26.4 25.9 - 32.4 pg    MCHC 31.5 (L) 32.0 - 36.0 g/dL    RDW 79.3 (H) 87.7 - 15.2 %    MPV 10.2 6.8 - 10.7 fL    Platelet 18 (L) 150 - 450 10*9/L    nRBC 3 <=4 /100 WBCs    Neutrophils % 71.8 %    Lymphocytes % 17.4 %    Monocytes % 2.5 %    Eosinophils % 6.3 %    Basophils % 2.0 %    Absolute Neutrophils 10.2 (H) 1.8 - 7.8 10*9/L    Absolute Lymphocytes 2.5 1.1 - 3.6 10*9/L    Absolute Monocytes 0.4 0.3 - 0.8 10*9/L    Absolute Eosinophils 0.9 (H) 0.0 - 0.5 10*9/L    Absolute Basophils 0.3 (H) 0.0 - 0.1 10*9/L    Anisocytosis Marked (A) Not Present

## 2024-01-11 NOTE — Progress Notes (Signed)
 Access:  PIV    Labs: Outside of parameters; hgb is 10.4 and platelets 18 so no transfusion needed today    Patient reported new or worsening symptoms (if yes, explanation needed): No    Provider Notified: N/A    Ok to Treat given: N/A    Dignicap: N/A    Treatment Administered : none-- PIV removed per protocol, dressing applied, patient discharged home to self care in stable condition    Patient Education Performed (if yes,explanation needed): No    Patient/Patient's Representative verbalized understanding of the education provided, with no additional questions or concerns at this time. :N/A    Time Spent on Patient Education: 0 min    Pre-Chemotherapy Blood Return : N/A    Hypersensitivity Reaction (if yes, explanation needed): N/A    Post Chemotherapy Blood Return: N/A    Heparin Locked: N/A    Dressing: gauze/Coban dressing to PIV removal site    Stable Discharge (if no explanation needed): Yes

## 2024-01-12 DIAGNOSIS — D7581 Myelofibrosis: Principal | ICD-10-CM

## 2024-01-17 NOTE — Telephone Encounter (Signed)
 Copied from CRM #[id number]. Topic: Referral - New Referral Request  >> Jan 17, 2024  2:19 PM Bria J wrote:  The patient is requesting:    Referral: Pain Management     Reason for Request: back and arthritis     Facility (Name, Location, & Phone/Fax): somewhere local in [place], [place], [place], or [place] area.     Coverage: yes, coverage is accurate on file.    Preferred Contact: Cell Phone Telephone Information:  Mobile          [phone]      Routine callback turnaround time: 24-48 business hours. Programmer, Systems Notified)

## 2024-01-17 NOTE — Telephone Encounter (Signed)
 I placed this referral in November can someone please fax it again? It was to [facility]

## 2024-01-21 ENCOUNTER — Ambulatory Visit: Admit: 2024-01-21 | Discharge: 2024-01-22 | Payer: Medicare (Managed Care)

## 2024-01-21 ENCOUNTER — Encounter: Admit: 2024-01-21 | Discharge: 2024-01-22 | Payer: Medicare (Managed Care)

## 2024-01-21 DIAGNOSIS — D7581 Myelofibrosis: Principal | ICD-10-CM

## 2024-01-21 DIAGNOSIS — R319 Hematuria, unspecified: Principal | ICD-10-CM

## 2024-01-21 DIAGNOSIS — D696 Thrombocytopenia, unspecified: Principal | ICD-10-CM

## 2024-01-21 DIAGNOSIS — N3001 Acute cystitis with hematuria: Principal | ICD-10-CM

## 2024-01-21 DIAGNOSIS — D509 Iron deficiency anemia, unspecified: Principal | ICD-10-CM

## 2024-01-21 LAB — IRON & TIBC
IRON SATURATION (CALC): 19 %
IRON: 50 ug/dL (ref 50–170)
TOTAL IRON BINDING CAPACITY (CALC): 258.3 ug/dL (ref 250.0–425.0)
TRANSFERRIN: 205 mg/dL — ABNORMAL LOW (ref 250.0–380.0)

## 2024-01-21 LAB — COMPREHENSIVE METABOLIC PANEL
ALBUMIN: 3.9 g/dL (ref 3.4–5.0)
ALKALINE PHOSPHATASE: 93 U/L (ref 46–116)
ALT (SGPT): 8 U/L — ABNORMAL LOW (ref 10–49)
ANION GAP: 14 mmol/L (ref 5–14)
AST (SGOT): 16 U/L (ref <=34)
BILIRUBIN TOTAL: 0.9 mg/dL (ref 0.3–1.2)
BLOOD UREA NITROGEN: 30 mg/dL — ABNORMAL HIGH (ref 9–23)
BUN / CREAT RATIO: 18
CALCIUM: 9.3 mg/dL (ref 8.7–10.4)
CHLORIDE: 100 mmol/L (ref 98–107)
CO2: 31.6 mmol/L — ABNORMAL HIGH (ref 20.0–31.0)
CREATININE: 1.64 mg/dL — ABNORMAL HIGH (ref 0.55–1.02)
EGFR CKD-EPI (2021) FEMALE: 33 mL/min/1.73m2 — ABNORMAL LOW (ref >=60)
GLUCOSE RANDOM: 113 mg/dL (ref 70–179)
POTASSIUM: 4.4 mmol/L (ref 3.4–4.8)
PROTEIN TOTAL: 8.1 g/dL (ref 5.7–8.2)
SODIUM: 146 mmol/L — ABNORMAL HIGH (ref 135–145)

## 2024-01-21 LAB — URINALYSIS WITH MICROSCOPY WITH CULTURE REFLEX PERFORMABLE
BILIRUBIN UA: NEGATIVE
GLUCOSE UA: NEGATIVE
HYALINE CASTS: 12 /LPF — ABNORMAL HIGH (ref 0–1)
KETONES UA: NEGATIVE
NITRITE UA: NEGATIVE
PH UA: 5 (ref 5.0–9.0)
RBC UA: >182 /HPF — ABNORMAL HIGH (ref <=4)
SPECIFIC GRAVITY UA: 1.016 (ref 1.003–1.030)
SQUAMOUS EPITHELIAL: 8 /HPF — ABNORMAL HIGH (ref 0–5)
UROBILINOGEN UA: <2
WBC UA: 71 /HPF — ABNORMAL HIGH (ref 0–5)

## 2024-01-21 LAB — CBC W/ AUTO DIFF
BASOPHILS ABSOLUTE COUNT: 0.2 10*9/L — ABNORMAL HIGH (ref 0.0–0.1)
BASOPHILS RELATIVE PERCENT: 1.9 %
EOSINOPHILS ABSOLUTE COUNT: 0.8 10*9/L — ABNORMAL HIGH (ref 0.0–0.5)
EOSINOPHILS RELATIVE PERCENT: 6.4 %
HEMATOCRIT: 32.9 % — ABNORMAL LOW (ref 34.0–44.0)
HEMOGLOBIN: 10.5 g/dL — ABNORMAL LOW (ref 11.3–14.9)
LYMPHOCYTES ABSOLUTE COUNT: 2 10*9/L (ref 1.1–3.6)
LYMPHOCYTES RELATIVE PERCENT: 16.1 %
MEAN CORPUSCULAR HEMOGLOBIN CONC: 32 g/dL (ref 32.0–36.0)
MEAN CORPUSCULAR HEMOGLOBIN: 26.9 pg (ref 25.9–32.4)
MEAN CORPUSCULAR VOLUME: 84.1 fL (ref 77.6–95.7)
MEAN PLATELET VOLUME: 10.5 fL (ref 6.8–10.7)
MONOCYTES ABSOLUTE COUNT: 0.2 10*9/L — ABNORMAL LOW (ref 0.3–0.8)
MONOCYTES RELATIVE PERCENT: 2 %
NEUTROPHILS ABSOLUTE COUNT: 9.1 10*9/L — ABNORMAL HIGH (ref 1.8–7.8)
NEUTROPHILS RELATIVE PERCENT: 73.6 %
NUCLEATED RED BLOOD CELLS: 2 /100{WBCs} (ref <=4)
PLATELET COUNT: 16 10*9/L — ABNORMAL LOW (ref 150–450)
RED BLOOD CELL COUNT: 3.91 10*12/L — ABNORMAL LOW (ref 3.95–5.13)
RED CELL DISTRIBUTION WIDTH: 20.7 % — ABNORMAL HIGH (ref 12.2–15.2)
WBC ADJUSTED: 12.3 10*9/L — ABNORMAL HIGH (ref 3.6–11.2)

## 2024-01-21 LAB — SLIDE REVIEW

## 2024-01-21 LAB — PLATELET COUNT: PLATELET COUNT: 18 10*9/L — ABNORMAL LOW (ref 150–450)

## 2024-01-21 LAB — LACTATE DEHYDROGENASE: LACTATE DEHYDROGENASE: 522 U/L — ABNORMAL HIGH (ref 120–246)

## 2024-01-21 LAB — FERRITIN: FERRITIN: 80.5 ng/mL (ref 30.0–270.7)

## 2024-01-21 MED ORDER — CIPROFLOXACIN 500 MG TABLET
ORAL_TABLET | Freq: Two times a day (BID) | ORAL | 0 refills | 7.00000 days | Status: CP
Start: 2024-01-21 — End: 2024-01-28

## 2024-01-21 NOTE — Patient Instructions (Signed)
 Infusion on 01/21/2024   Component Date Value Ref Range Status    Color, UA 01/21/2024 Light Orange   Final    Clarity, UA 01/21/2024 Turbid   Final    Specific Gravity, UA 01/21/2024 1.016  1.003 - 1.030 Final    pH, UA 01/21/2024 5.0  5.0 - 9.0 Final    Leukocyte Esterase, UA 01/21/2024 Moderate (A)  Negative Final    Nitrite, UA 01/21/2024 Negative  Negative Final    Protein, UA 01/21/2024 Trace (A)  Negative Final    Glucose, UA 01/21/2024 Negative  Negative Final    Ketones, UA 01/21/2024 Negative  Negative Final    Urobilinogen, UA 01/21/2024 <2.0 mg/dL  <7.9 mg/dL Final    Bilirubin, UA 01/21/2024 Negative  Negative Final    Blood, UA 01/21/2024 Large (A)  Negative Final    RBC, UA 01/21/2024 >182 (H)  <=4 /HPF Final    WBC, UA 01/21/2024 71 (H)  0 - 5 /HPF Final    Squam Epithel, UA 01/21/2024 8 (H)  0 - 5 /HPF Final    Bacteria, UA 01/21/2024 Occasional (A)  None Seen /HPF Final    Hyaline Casts, UA 01/21/2024 12 (H)  0 - 1 /LPF Final    Mucus, UA 01/21/2024 Few (A)  None Seen /HPF Final    PRODUCT CODE 01/21/2024 [id number]   Final    Product ID 01/21/2024 Platelets   Final    Status 01/21/2024 Transfused   Final    Unit # 01/21/2024 [id number]   Final    Unit Blood Type 01/21/2024 A Pos   Final    ISBT Number 01/21/2024 [id number]   Final    Platelet 01/21/2024 18 (L)  150 - 450 10*9/L Final   Clinical Support on 01/21/2024   Component Date Value Ref Range Status    Sodium 01/21/2024 146 (H)  135 - 145 mmol/L Final    Potassium 01/21/2024 4.4  3.4 - 4.8 mmol/L Final    Chloride 01/21/2024 100  98 - 107 mmol/L Final    CO2 01/21/2024 31.6 (H)  20.0 - 31.0 mmol/L Final    Anion Gap 01/21/2024 14  5 - 14 mmol/L Final    BUN 01/21/2024 30 (H)  9 - 23 mg/dL Final    Creatinine 87/84/7974 1.64 (H)  0.55 - 1.02 mg/dL Final    BUN/Creatinine Ratio 01/21/2024 18   Final    eGFR CKD-EPI (2021) Female 01/21/2024 33 (L)  >=60 mL/min/1.29m2 Final    eGFR calculated with CKD-EPI 2021 equation in accordance with [employer] and [employer] of Nephrology Task Force recommendations.    Glucose 01/21/2024 113  70 - 179 mg/dL Final    Calcium 87/84/7974 9.3  8.7 - 10.4 mg/dL Final    Albumin 87/84/7974 3.9  3.4 - 5.0 g/dL Final    Total Protein 01/21/2024 8.1  5.7 - 8.2 g/dL Final    Total Bilirubin 01/21/2024 0.9  0.3 - 1.2 mg/dL Final    AST 87/84/7974 16  <=34 U/L Final    ALT 01/21/2024 8 (L)  10 - 49 U/L Final    Alkaline Phosphatase 01/21/2024 93  46 - 116 U/L Final    LDH 01/21/2024 522 (H)  120 - 246 U/L Final    Iron  01/21/2024 50  50 - 170 ug/dL Final    TIBC 87/84/7974 258.3  250.0 - 425.0 ug/dL Final    Transferrin 87/84/7974 205.0 (L)  250.0 - 380.0 mg/dL Final  Iron  Saturation (%) 01/21/2024 19  % Final    Ferritin 01/21/2024 80.5  30.0 - 270.7 ng/mL Final    WBC 01/21/2024 12.3 (H)  3.6 - 11.2 10*9/L Final    RBC 01/21/2024 3.91 (L)  3.95 - 5.13 10*12/L Final    HGB 01/21/2024 10.5 (L)  11.3 - 14.9 g/dL Final    HCT 87/84/7974 32.9 (L)  34.0 - 44.0 % Final    MCV 01/21/2024 84.1  77.6 - 95.7 fL Final    MCH 01/21/2024 26.9  25.9 - 32.4 pg Final    MCHC 01/21/2024 32.0  32.0 - 36.0 g/dL Final    RDW 87/84/7974 20.7 (H)  12.2 - 15.2 % Final    MPV 01/21/2024 10.5  6.8 - 10.7 fL Final    Platelet 01/21/2024 16 (L)  150 - 450 10*9/L Final    nRBC 01/21/2024 2  <=4 /100 WBCs Final    Neutrophils % 01/21/2024 73.6  % Final    Lymphocytes % 01/21/2024 16.1  % Final    Monocytes % 01/21/2024 2.0  % Final    Eosinophils % 01/21/2024 6.4  % Final    Basophils % 01/21/2024 1.9  % Final    Absolute Neutrophils 01/21/2024 9.1 (H)  1.8 - 7.8 10*9/L Final    Absolute Lymphocytes 01/21/2024 2.0  1.1 - 3.6 10*9/L Final    Absolute Monocytes 01/21/2024 0.2 (L)  0.3 - 0.8 10*9/L Final    Absolute Eosinophils 01/21/2024 0.8 (H)  0.0 - 0.5 10*9/L Final    Absolute Basophils 01/21/2024 0.2 (H)  0.0 - 0.1 10*9/L Final    Anisocytosis 01/21/2024 Marked (A)  Not Present Final    Smear Review Comments 01/21/2024 See Comment  Undefined Final    Smear reviewed  Myelocytes present.      Polychromasia 01/21/2024 Moderate (A)  Not Present Final    Toxic Granulation 01/21/2024 Present (A)  Not Present Final    Toxic Vacuolation 01/21/2024 Present (A)  Not Present Final

## 2024-01-21 NOTE — Progress Notes (Signed)
 Access:  PIV    Labs: Outside of parameters    Patient reported new or worsening symptoms (if yes, explanation needed): Yes, when asked pt if any S/Sx of bleeding pt remarked that she has had episodes of hematuria last week that she feels has resolved. Pt denies any dysuria or fever/chills.    Provider Notified: Yes - R. Sawchak NP and R. Wallman PA made aware of pt lab results and report of hematuria - UA/UCx obtained and sent per order that was placed and plan to transfuse with Plt's per order    [facility] to Treat given: Yes - per FABIENE Adjutant NP to transfuse with plt's today due to c/o hematuria - new Rx for Cipro  placed to pharmacy    Dignicap: N/A    Treatment Administered : Platelet transfusion x 1 (no pre-meds required)    Patient Education Performed (if yes,explanation needed): Yes, reviewed plan of care for today with lab results and reviewed new Rx for Cipro  for UTI and importance of hydration     Patient/Patient's Representative verbalized understanding of the education provided, with no additional questions or concerns at this time. :Yes    Time Spent on Patient Education: 10 min    Pre-transfusion Blood Return : Yes    Hypersensitivity Reaction (if yes, explanation needed): No    Post transfusion Blood Return: Yes    Heparin Locked: N/A    Dressing: gauze and coban wrap applied after PIV flushed and removed per protocol    Stable Discharge (if no explanation needed): Yes    - Pt ambulated with stable gait to treatment area for count check with possible transfusion. Pt denies any complaints at this time. Pt had PIV placed and labs drawn and sent with nurse visit, awaiting results.     - Labs resulted with Hgb 10.5 and Platelets 16. When asked pt if any S/Sx of bleeding pt admitted to episodes of hematuria last week which she feels has resolved. Pt denies dysuria or fever/chills. Provider made aware and UA/Ucx sent and resulted. Decision to treat for UTI with Rx for Ciporo which pt was educated about and verbalized understanding. Decision to also transfuse with Plt for Hx hematuria. Pt declines need for pre-meds. 1 bag platelets transfused and pt tolerated well with VSS. 1st PPC = 18. R. Sawchak NP made aware and declined need for 2nd bag of Platelets.     - Pt tolerated transfusion of Platelets x 1 bag well with no adverse reaction noted with VSS. PIV with good BR at completion and flushed and removed per protocol, area covered with 2x2 gauze and coban wrap. Patient was instructed to stop by the check out desk to complete today's visit and to receive a printed after visit summary then discharged home to self care and Pt ambulated from treatment area with stable gait.

## 2024-01-21 NOTE — Progress Notes (Signed)
 Brief Adult Oncology Infusion Clinic Note:    Paged regarding: plts 16k and hematuria      HPI:  Emily Walter is a 73 y.o. 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      Interval History:  Patient presents to infusion clinic today and possible transfusion support; spouse is present at chairside. Notified by RN that patient reported some intermittent hematuria in last 1-2 weeks. Evaluated patient at chairside. Patient states that other than feeling a bit tired this morning, she otherwise feels fine. She states she has noticed some intermittent darkening of her urine with scant blood. Denies fever, chills, dysuria, urinary frequency/hesitancy or urinary incontinence.        ROS:  All other systems review negative      Physical Exam:  Constitutional: Alert and oriented. Fatigued but well appearing and in no distress.  Eyes: Conjunctivae are normal.  ENT       Head: Normocephalic and atraumatic.       Neck: No stridor.  Cardiovascular: Normal rate, regular rhythm. Normal and symmetric distal pulses are present in all extremities.  Respiratory: Normal respiratory effort. Breath sounds are normal.  Gastrointestinal: Soft and nontender. There is no CVA or suprapubic tenderness.  Musculoskeletal: Nontender with normal range of motion in all extremities.       Right lower leg: No tenderness or edema.       Left lower leg: No tenderness or edema.  Neurologic: Normal speech and language. No gross focal neurologic deficits are appreciated.  Skin: Skin is warm, dry and intact. No rash noted.  Psychiatric: Mood and affect are normal. Speech and behavior are normal.      Assessment/Plan:  Acute cystitis   -Patient is followed by [facility] for renal calculi and by PCP for CKD stage 3   -Patient has hx of recurrent chronic UTI    -New prescription for Cipro  500mg  PO BID x 7 days sent to patient's preferred pharmacy   -In setting of recurrent UTI's, consider discussion with Urology for possible chronic suppressive therapy    Thrombocytopenia   -Hematuria most likely 2/2 cystitis/bladder irritation 2/2 acute UTI   -Transfuse 1u for plts 16k; OP threshold 20k    Myelofibrosis   -Patient is followed by Dr. Daphne Cave   -Continue momelotinib per direction of primary team      Disposition:  Patient received platelet transfusion without any issues. Prescription for Cipro  antibiotics were sent to patient's pharmacy and advised to compete the entire treatment course unless otherwise directed. Patient and spouse expressed understanding and agreement with plan.      Jary Louvier, PA-C  ADVANCED PRACTICE PROVIDER FOR INFUSION PROGRAM

## 2024-01-22 ENCOUNTER — Ambulatory Visit: Admit: 2024-01-22 | Discharge: 2024-01-23 | Payer: Medicare (Managed Care)

## 2024-01-22 DIAGNOSIS — F1190 Opioid use, unspecified, uncomplicated: Principal | ICD-10-CM

## 2024-01-22 DIAGNOSIS — M47816 Spondylosis without myelopathy or radiculopathy, lumbar region: Principal | ICD-10-CM

## 2024-01-22 NOTE — Progress Notes (Signed)
 Patient Name: Emily Walter  Date of Birth: 1950/07/19  Primary Care Physician: Marnie Emmie Dais, MD       Geni was seen today for establish care.    Diagnoses and all orders for this visit:    Lumbar spondylosis    Lumbar radiculopathy    Chronic, continuous use of opioids    Stage 3a chronic kidney disease (CMS-HCC)    Class 2 obesity with body mass index (BMI) of 37.0 to 37.9 in adult, unspecified obesity type, unspecified whether serious comorbidity present    Assessment & Plan  Chronic low back pain with right-sided lumbar radiculopathy  Chronic low back pain with intermittent right-sided lumbar radiculopathy, exacerbated by prolonged sitting, standing, and riding. Pain occasionally radiates down the back of the right leg, stopping at the knee, with severity reaching 10/10. Current management includes oxycodone , lidocaine patches, and Voltaren  gel. Not a candidate at this time for injections due to thrombocytopenia. Physical therapy has not been pursued recently.  - Continue oxycodone , lidocaine patches, and Voltaren  gel for pain management.     Gait instability and recurrent falls  Gait instability with recent falls resulting in injuries, associated with dizziness and unsteadiness. High risk of bleeding due to thrombocytopenia increases concern for falls.  - Encourage consistent use of walker to prevent falls.  - Educate on fall prevention strategies and importance of using assistive devices.    Primary myelofibrosis with secondary thrombocytopenia  Primary myelofibrosis with secondary thrombocytopenia. Under care at a cancer center with plans for infusions to improve blood counts. Ongoing care coordination between cancer center and primary care provider.     Chronic, continued use of opioids. Discussed with the patient in details risks of continued opioid intake including opioid hyperalgesia, and opioid-induced adrenal insufficiency with low cortisol levels.   - Was previous rx'd Oxycodone  10/325mg  3-4 times daily from [facility]. Patient reports only taking medications 2-3 times daily. States she takes at least once per day but sometimes needs to take 2-3 times daily for pain. Will decrease rx quantity prescribed and directions to reflect. Fill date of 02/20/2024. I have recommended patient to find a pain management provider closer to her home since she has moved.   - Oxycodone  10/325mg  #75 sent in by Camelia Curl, NP-C  - (UDS from 12/17/2023 appropriate).    Stage 3a CKD- No NSAIDS.     Obesity- will benefit from diet, exercise and weight loss; activity currently limited by pain. BMI 36.38          No follow-ups on file.     I have reviewed past medical, surgical, medication, allergy, social, and family histories today and updated them in Epic where appropriate.    Subjective:     Chief Compaint: Pain    HPI:   History of Present Illness  Emily Walter is a 73 year old female with myelofibrosis who presents with back pain and leg pain.    She experiences lower back pain that sometimes radiates down her right leg, stopping at the knee. The pain is severe, reaching a 9 out of 10 at its worst, and is exacerbated by prolonged sitting, standing, and riding in a vehicle. Currently, there is no back pain radiating down the legs, but past occurrences are noted.    She has a history of myelofibrosis and is under the care of a cancer center. She has previously required platelet and whole blood transfusions, particularly after kidney stone surgery in March of last  year. She is not currently receiving transfusions but is monitored by the cancer center.    She uses oxycodone  for pain management, originally prescribed for her back pain, along with lidocaine patches and Voltaren  gel. She has allergies to gabapentin, Lyrica, codeine, steroids, prednisone , and fentanyl.    She has experienced two recent falls, one resulting in a concussion, and reports dizziness and hearing loss in one ear. She uses a cane and has two walkers. No smoking, diabetes, or use of blood thinners. She has not undergone physical therapy recently, nor has she had back surgery or epidural injections.       PDMP Review Status:  I personally reviewed the PDMP which was remarkable for 56.7 Current MME/day     Imaging:  Impression   Moderate spinal canal stenosis at L4-L5 with narrowing of the lateral recess and impingement traversing bilateral L5 nerves, progressed since 2008.      Interval progression of neural foraminal narrowing at L4-L5, causing impingement of the exiting L4 nerves at this level.      Abnormal marrow signal consistent with myelofibrosis.     Narrative   EXAM: Magnetic resonance imaging, spinal canal and contents, lumbar, without contrast material.   DATE: 09/13/2020 4:53 PM   ACCESSION: [id number]   DICTATED: 09/23/2020 1:08 PM   INTERPRETATION LOCATION: [facility]      CLINICAL INDICATION: 73 years old Female with right sided sciatica ; Lumbar radiculopathy, > 6 wks  - M54.31 - Right sided sciatica        COMPARISON: MRI lumbar spine 05/21/2006.      TECHNIQUE: Multiplanar MRI was performed through the lumbar spine without intravenous contrast.      FINDINGS:   Diffuse T1 and T2 hypointense marrow signal consistent with myelofibrosis. The visualized cord is unremarkable and the conus medullaris ends at a normal level.      Dictated 1-2 anterolisthesis of L4 on L5. Disc desiccation with loss of disc height.      L1-L2: Disc bulge, ligamentum flavum hypertrophy and facet arthropathy. No spinal canal or neural foraminal narrowing.      L2-L3: Disc bulge with superimposed facet arthropathy, ligamentum flavum hypertrophy causes mild spinal canal narrowing. Disc contents contact the traversing L3 nerve bilaterally. No neural foraminal narrowing.      L3-L4: Disc bulge with superimposed facet arthropathy, ligamentum flavum hypertrophy causes mild spinal canal narrowing. Disc contents contact the traversing L4 nerves bilaterally.  No neural foraminal narrowing.      L4-L5: Retrolisthesis with superimposed disc bulge, ligamentum flavum hypertrophy and facet arthropathy causes moderate spinal canal narrowing. The disc narrows bilateral lateral recesses impinging the traversing bilateral L5 nerves. Severe bilateral neural foraminal narrowing with impingement of the exiting L4 nerves.      L5-S1: No herniation. No spinal canal narrowing. No neural foraminal narrowing.      The paraspinal tissues are within normal limits.      For the purposes of this dictation, the lowest well formed intervertebral disc space is assumed to be the L5-S1 level, and there are presumed to be five lumbar-type vertebral bodies.          Pain Intervention History:  None     Opioid Risk Screening Tools:    PEG Pain Total Score: 6    ROS:  Review of systems negative unless otherwise noted as per HPI.    Allergies:   Fentanyl, Codeine, Corticosteroids (glucocorticoids), Gabapentin, Prednisone , and Pregabalin    Medications: Current Medications[1]  Past Medical History[2]  Past Surgical History[3]  Family History[4]  Short Social History[5]    Objective:       01/22/24 1029   BP: 135/77   Pulse: 66   Resp: 18   Temp: 36 ??C (96.8 ??F)   SpO2: 100%   Weight: 93.2 kg (205 lb 6.4 oz)   Height: 160 cm (5' 3)   PainSc: 6         Body mass index is 36.38 kg/m??.     Physical Exam:  Constitutional: Alert, no acute distress  HEENT: Normocephalic  Respiratory: Regular respiratory rate, non-labored breathing  Cardiovascular: Regular Rate. No focal peripheral edema in lower extremities  Musculoskeletal: Lumbar Spine with ROM restricted in flexion to 70 degrees, extension to 20 degrees. Focal TTP over lumbar paraspinal mm, no central bony TTP, stepoff, or deformity appreciated. SLR equivocal bilaterally. + pain with facet loading bilaterally.    Dermatologic: Skin warm and well perfused. No focal rash observed  Abdomen: non-distended  Neurologic: No focal motor or sensory deficits. Gait antalgic LE strength 4/5 B/L   Psychiatric: Mood normal. Affect appropriate    Please note that this dictation was completed with Training and development officer. Quite often unanticipated grammatical and other interpretative errors are inadvertently transcribed by the computer software. Please regard these errors. Please excuse any errors that have escaped final proofreading.    Camelia KATHEE Curl, FNP   01/22/2024   11:28 AM           [1]   Current Outpatient Medications:     albuterol  HFA 90 mcg/actuation inhaler, Inhale 2 puffs every six (6) hours as needed for wheezing or shortness of breath., Disp: 8 g, Rfl: 3    cholecalciferol , vitamin D3-50 mcg, 2,000 unit,, 50 mcg (2,000 unit) tablet, Take 1 tablet (50 mcg total) by mouth in the morning., Disp: , Rfl:     ciprofloxacin  HCl (CIPRO ) 500 MG tablet, Take 1 tablet (500 mg total) by mouth two (2) times a day for 7 days., Disp: 14 tablet, Rfl: 0    diclofenac  sodium (VOLTAREN ) 1 % gel, Apply 4 g topically four (4) times a day as needed for arthritis or pain (Knee pain)., Disp: 100 g, Rfl: 2    fluticasone propionate (FLONASE) 50 mcg/actuation nasal spray, 2 sprays into each nostril daily as needed for allergies., Disp: , Rfl:     furosemide  (LASIX ) 40 MG tablet, Take 1 tablet (40 mg total) by mouth daily., Disp: 90 tablet, Rfl: 2    levocetirizine (XYZAL) 5 MG tablet, Take 1 tablet (5 mg total) by mouth every evening., Disp: 90 tablet, Rfl: 2    lidocaine (LIDODERM) 5 % patch, Place 1 patch on the skin daily. Apply to affected area for 12 hours only each day (then remove patch), Disp: , Rfl:     lovastatin  (MEVACOR ) 40 MG tablet, Take 1 tablet (40 mg total) by mouth daily., Disp: 90 tablet, Rfl: 2    miscellaneous medical supply Misc, , Disp: , Rfl:     momelotinib (OJJAARA ) 100 mg tablet, Take 1 tablet (100 mg total) by mouth daily. Swallow tablets whole; do not cut, crush, or chew., Disp: 30 tablet, Rfl: 5    NARCAN 4 mg/actuation nasal spray, 1 spray into alternating nostrils once as needed., Disp: , Rfl: 0    omeprazole  (PRILOSEC) 40 MG capsule, Take 1 capsule (40 mg total) by mouth daily., Disp: 90 capsule, Rfl: 3    ondansetron  (ZOFRAN -ODT) 4 MG  disintegrating tablet, Dissolve 1 tablet (4 mg total) in the mouth every eight (8) hours as needed for nausea., Disp: 30 tablet, Rfl: 2    oxyCODONE -acetaminophen  (PERCOCET) 10-325 mg per tablet, Take 1 tablet by mouth every six (6) hours as needed for pain., Disp: , Rfl:     oxyCODONE -acetaminophen  (PERCOCET) 10-325 mg per tablet, Take 1 tablet by mouth every 6-8 hours as needed for pain, Disp: 110 tablet, Rfl: 0    sertraline  (ZOLOFT ) 25 MG tablet, Take 1 tablet (25 mg total) by mouth daily., Disp: 90 tablet, Rfl: 3    spironolactone  (ALDACTONE ) 25 MG tablet, Take 1 tablet (25 mg total) by mouth daily., Disp: 90 tablet, Rfl: 2    ULTRA-LIGHT ROLLATOR Misc, Use as directed., Disp: , Rfl: 0  [2]   Past Medical History:  Diagnosis Date    Anxiety     Arthritis     Asthma (HHS-HCC)     Cancer    (CMS-HCC)     Cataract     Chronic kidney disease     Depression     Difficult intravenous access     GERD (gastroesophageal reflux disease)     Heart failure (CMS-HCC)     HLD (hyperlipidemia)     Hypertension     Myelofibrosis    (CMS-HCC)     Nephrolithiasis     Sleep apnea     Tinnitus    [3]   Past Surgical History:  Procedure Laterality Date    HYSTERECTOMY      PR CYSTO/URETERO W/LITHOTRIPSY &INDWELL STENT INSRT Left 08/21/2018    Procedure: priority CYSTOURETHROSCOPY, WITH URETEROSCOPY AND/OR PYELOSCOPY; WITH LITHOTRIPSY INCLUDING INSERTION OF INDWELLING URETERAL STENT;  Surgeon: Nicholaus Mt Viprakasit, MD;  Location: CYSTO PROCEDURE SUITES [facility];  Service: Urology    PR CYSTO/URETERO/PYELOSCOPY, CALCULUS TX Left 09/04/2018    Procedure: CYSTOURETHROSCOPY, W/URETEROSCOPY &/OR PYELOSCOPY; W/REMOVAL/MANIPULATION CALCULUS(URETERAL CATH INCLUDED);  Surgeon: Nicholaus Mt Viprakasit, MD;  Location: CYSTO PROCEDURE SUITES [facility]; Service: Urology    PR CYSTOSCOPY,INSERT URETERAL STENT Left 05/04/2022    Procedure: CYSTOURETHROSCOPY,  WITH INSERTION OF INDWELLING URETERAL STENT (EG, GIBBONS OR DOUBLE-J TYPE);  Surgeon: Drena Darryle Gull, MD;  Location: MAIN OR [facility];  Service: Urology    PR CYSTOSCOPY,REMV CALCULUS,SIMPLE Left 09/04/2018    Procedure: CYSTOURETHROSCOPY, WITH REMOVAL OF FOREIGN BODY, CALCULUS OR URETERAL STENT FROM URETHRA OR BLADDER; SIMPLE;  Surgeon: Nicholaus Mt Viprakasit, MD;  Location: CYSTO PROCEDURE SUITES [facility];  Service: Urology    PR CYSTOURETHROSCOPY,URETER CATHETER Left 08/21/2018    Procedure: CYSTOURETHROSCOPY, W/URETERAL CATHETERIZATION, W/WO IRRIG, INSTILL, OR URETEROPYELOG, EXCLUS OF RADIOLG SVC;  Surgeon: Nicholaus Mt Viprakasit, MD;  Location: CYSTO PROCEDURE SUITES [facility];  Service: Urology    PR CYSTOURETHROSCOPY,URETER CATHETER Left 09/04/2018    Procedure: CYSTOURETHROSCOPY, W/URETERAL CATHETERIZATION, W/WO IRRIG, INSTILL, OR URETEROPYELOG, EXCLUS OF RADIOLG SVC;  Surgeon: Nicholaus Mt Viprakasit, MD;  Location: CYSTO PROCEDURE SUITES [place];  Service: Urology    PR PERQ DILATION XST TRC NEW ACCESS RENAL COLTJ SYS Left 04/24/2022    Procedure: DILATION OF EXISTING TRACT, PERC, FOR ENDOUROLOGIC PROC INCL IMAGING GUIDANCE & RADIOLOGIC SUPERVISION & INTERPRET, W POSTPROC TUBE PLACEMENT, WHEN PERF; INCL NEW ACCESS INTO RENAL COLLECTING SYSTEM;  Surgeon: Mitch Alm Pickles, MD;  Location: [facility] OR [facility];  Service: Urology    PR PERQ NL/PL LITHOTRP COMPLEX >2 CM MLT LOCATIONS Left 04/24/2022    Procedure: standard PCNL prone vs. standard; need fortec;  Surgeon: Mitch Alm Pickles, MD;  Location: [facility] OR [facility];  Service: Urology    PR REMOVE BLADDER STONE,<2.5 CM N/A 08/21/2018    Procedure: LITHOLAPAXY: CRUSH/FRAGMENT CALCULUS, BY ANY MEANS IN BLADDER & REMOVAL OF FRAGMENTS; SIMPLE/SMALL <2.5 CM;  Surgeon: Nicholaus Mt Viprakasit, MD;  Location: CYSTO PROCEDURE SUITES [facility];  Service: Urology SALPINGOOPHORECTOMY Right     12/07/2010    VAGINAL HYSTERECTOMY      12/07/2010   [4]   Family History  Problem Relation Age of Onset    Hypertension Mother     Cancer Brother         bone    Hypertension Brother     Hypertension Brother     Hypertension Brother     Anesthesia problems Neg Hx     Bleeding Disorder Neg Hx     Breast cancer Neg Hx     Ovarian cancer Neg Hx     Colon cancer Neg Hx     Endometrial cancer Neg Hx    [5]   Social History  Tobacco Use    Smoking status: Never    Smokeless tobacco: Never   Vaping Use    Vaping status: Never Used   Substance Use Topics    Alcohol use: No    Drug use: No

## 2024-01-23 DIAGNOSIS — D7581 Myelofibrosis: Principal | ICD-10-CM

## 2024-01-23 MED ORDER — AMOXICILLIN 875 MG-POTASSIUM CLAVULANATE 125 MG TABLET
ORAL_TABLET | Freq: Two times a day (BID) | ORAL | 0 refills | 7.00000 days | Status: CP
Start: 2024-01-23 — End: 2024-01-30

## 2024-01-23 NOTE — Telephone Encounter (Signed)
 Called patient to review urine culture results.  This grew out strep bovis.  She was initially prescribed cipro , which data shows would not be effective against this and recommends treatment with a penicillin. Spoke to the patient to confirm no PCN allergy and sent in Augmentin  875, 1 tab po BID x 7 days. She will stop the cipro . Verbalized understanding.

## 2024-01-23 NOTE — Progress Notes (Signed)
 [facility] Refill Coordination Note    Specialty Medication(s) to be Shipped:   Hematology/Oncology: OJJAARA  100 mg tablet (momelotinib)    Other medication(s) to be shipped: No additional medications requested for fill at this time    Specialty Medications not needed at this time: N/A     Emily Walter, DOB: 1950/12/31  Phone: [phone] (home)       All above HIPAA information was verified with patient.     Was a nurse, learning disability used for this call? No    Completed refill call assessment today to schedule patient's medication shipment from the [facility]  [phone]).  All relevant notes have been reviewed.     Specialty medication(s) and dose(s) confirmed: Regimen is correct and unchanged.   Changes to medications: Emily Walter reports no changes at this time.  Changes to insurance: No  New side effects reported not previously addressed with a pharmacist or physician: None reported  Questions for the pharmacist: No    Confirmed patient received a Conservation Officer, Historic Buildings and a Surveyor, Mining with first shipment. The patient will receive a drug information handout for each medication shipped and additional FDA Medication Guides as required.       DISEASE/MEDICATION-SPECIFIC INFORMATION        N/A    SPECIALTY MEDICATION ADHERENCE     Medication Adherence    Patient reported X missed doses in the last month: 0  Specialty Medication: OJJAARA  100 mg tablet (momelotinib)  Patient is on additional specialty medications: No              Were doses missed due to medication being on hold? No    OJJAARA  100 mg tablet (momelotinib): 14 days of medicine on hand     Specialty medication is an injection or given on a cycle: No    REFERRAL TO PHARMACIST     Referral to the pharmacist: Not needed      [facility]     Shipping address confirmed in Epic.     Cost and Payment: Patient has a $0 copay, payment information is not required.    Delivery Scheduled: Yes, Expected medication delivery date: 01/30/24.     Medication will be delivered via UPS to the prescription address in Epic WAM.    Emily Walter   [facility]  Specialty Technician

## 2024-01-24 DIAGNOSIS — D7581 Myelofibrosis: Principal | ICD-10-CM

## 2024-01-25 DIAGNOSIS — S0990XS Unspecified injury of head, sequela: Principal | ICD-10-CM

## 2024-01-25 DIAGNOSIS — H9191 Unspecified hearing loss, right ear: Principal | ICD-10-CM

## 2024-01-25 MED ORDER — LEVOFLOXACIN 750 MG TABLET
ORAL_TABLET | Freq: Every day | ORAL | 0 refills | 10.00000 days | Status: CP
Start: 2024-01-25 — End: 2024-01-25

## 2024-01-25 MED ORDER — METHYLPREDNISOLONE 4 MG TABLETS IN A DOSE PACK
Freq: Every day | ORAL | 0 refills | 1.00000 days | Status: CP
Start: 2024-01-25 — End: 2025-01-24

## 2024-01-25 NOTE — Progress Notes (Signed)
 [facility] SAME DAY CLINIC NOTE     01/25/2024    PCP: Marnie Emmie Dais, MD     Assessment and Plan    There are no diagnoses linked to this encounter.  Assessment & Plan  Acute right-sided hearing loss  Chronic left ear hearing loss with recent right ear involvement 3 days ago and L otalgia and bilateral hemotympanum (Dr. Loralie confirmed). Ineffective ENT interventions, also endorses dizziness with bending over.  - Stop Augmentin   - Start levofloxacin  750 mg for 10 days and medrol  dose pack to alleviate inflammatory/infectious etiology  - Urgent referral to ENT  - r/o delayed basilar skull fracture w head CT (due to bilateral hemotympanum- reviewed CT from 08/2023)     Follow up as scheduled or sooner as needed.    Patient was seen and discussed with Dr. Loralie who is in agreement with the assessment and plan as outlined above.     Subjective    Problem List:  Problem List[1]    HPI  Emily Walter is a 73 y.o. year old female with the above problem list who presents to the same day clinic for   Chief Complaint   Patient presents with    Follow-up     Right ear hearing problems.     History of Present Illness  Emily Walter is a 73 year old with history of HFpEF, OSA, DM2, HTN, CKD 3, chronic back pain, and triple negative primary myelofibrosis presenting with acute right sided hearing loss, L sided ear pain, and dizziness. She is accompanied by her husband.    She describes progressive hearing loss, initially affecting her left ear for over 1 year ago with sudden onset, and now involving her right ear with sudden onset approximately 3 days ago. She notes that her left ear, previously without pain, has recently become painful, characterizing the pain as sometimes throbbing and associated with pressure, particularly during hiccups or belching. Soreness in both jaws also occurs. She states that she hears herself echoing when she talks and breathes. Her husband reports that she requires the television volume to be very high.    She reports ongoing dizziness since the initial loss of hearing in her left ear and worsening when she bends over. Her husband assists her to prevent falls, and she has experienced multiple falls in the past, including 1 that resulted in a concussion and significant facial bruising (CT reviewed). She denies experiencing nausea or vomiting with the dizziness.    For chronic back pain, she takes oxycodone , typically limiting herself to no more than 3 tablets per day, and occasionally uses extra strength over-the-counter pain relievers for breakthrough pain. She notes that neither oxycodone  nor the over-the-counter medication provides significant relief for her ear pain. She also reports starting an antibiotic for a bladder infection, and her medical team recently changed her prescription from Cipro  to another antibiotic (Augmentin )    Social History: Lives with spouse. Spouse provides assistance with daily activities.    Medical History: Myelofibrosis (2018). Concussion (08/2023).      Meds and allergies were reviewed in Epic    ROS: 10 point ROS was performed and is otherwise negative other than mentioned in the HPI    Objective  PE:  Vitals:    01/25/24 1307   BP: 140/77   Pulse: 75   Resp: 18   Temp: 36.1 ??C (97 ??F)   SpO2: 97%     General: well-appearing in NAD  Eyes: EOMI, sclera  clear  ENT: OP clear w/o erythema or exudate, bilateral tympanic membrane dark blue in color  CV: regular, no murmurs  Resp: CTAB, no wheezes or crackles, normal WOB  MSK: full ROM, no deformity noted  Skin: clean and dry, no rashes or lesions noted  Ext: mild symmetric edema bilateral lower extremities  Neuro: alert, follows commands. CN II-XII grossly intact    Procedure: None  See procedure note from this encounter    _______________________________________________________________________________________      Same Day Metric Tracker:    Same Day Metric Tracker:  Referral source for today's visit:  General Internal Medicine    Referral to other outpatient urgent services? N/A    Did today's visit result in referral to ED or direct admission? No                 [1]   Patient Active Problem List  Diagnosis    White coat syndrome with diagnosis of hypertension    Mixed urge and stress incontinence    Restless leg syndrome    Depression    Myelofibrosis    (CMS-HCC)    GERD (gastroesophageal reflux disease)    History of nephrolithiasis    Stage 3b chronic kidney disease (CMS-HCC)    Lumbar stenosis with neurogenic claudication    Osteoarthritis    Thrombocytopenia    Hyperlipidemia    Severe obstructive sleep apnea    Chronic heart failure with preserved ejection fraction (CMS-HCC)    Prediabetes    Calculus of kidney    Iron  deficiency anemia    Flank pain    Gallstones    Recurrent UTI    MDS/MPN (myelodysplastic/myeloproliferative neoplasms)    (CMS-HCC)    Splenomegaly    Dizziness

## 2024-01-25 NOTE — Patient Instructions (Addendum)
 You were seen in the same day clinic for hearing loss in your right ear    Recommendations:  - STOP taking augmentin , your current antibiotic.  - Start 7 days of levofloxacin  750 mg  - Start medrol  dose pack as instructed  - call ENT at [phone] to schedule your ENT appointment    If symptoms persist, please follow up or go to the ER. Thank you!

## 2024-01-27 NOTE — Progress Notes (Signed)
 I saw and evaluated the patient, participating in the key portions of the service.  I reviewed the resident???s note.  I agree with the resident???s findings and plan. Garwin Brothers, MD

## 2024-01-28 ENCOUNTER — Ambulatory Visit
Admit: 2024-01-28 | Discharge: 2024-01-29 | Payer: Medicare (Managed Care) | Attending: Audiologist | Primary: Audiologist

## 2024-01-28 NOTE — Progress Notes (Signed)
 [facility]  Audiology at [facility]    AUDIOLOGIC EVALUATION REPORT     Patient: Emily Walter, Emily Walter  MRN: [id number]  DOB: [birth year]  DATE OF EVALUATION: 01/28/2024    [place] Teammates: Please see Audiogram with full report under MEDIA tab  HISTORY     Camella Seim is a 73 y.o. individual seen by Audiology for a hearing evaluation as part of [facility], FNP's ENT clinic. Her medical history is significant for left SSNHL. Today, patient reports about a week long hx of right decreased hearing with tinnitus.    RESULTS     Otoscopy revealed:  Right Ear: clear external auditory canal  Left Ear: clear external auditory canal    Tympanometry using a 226 Hz probe tone was consistent with:  Right Ear: Type B tympanogram, consistent with abnormal middle ear function  Left Ear: Type B tympanogram, consistent with abnormal middle ear function    Today's behavioral evaluation was completed using conventional audiometry via insert earphones with good reliability.     Right Ear: Mild to profound mixed HL  Speech Recognition Threshold (SRT): 55 dB HL  Word Recognition Testing: 92% at 90 dB HL using recorded NU-6 word list.    Left Ear: Known profound HL    Patient was counseled on today's results and expressed understanding.     RECOMMENDATIONS     Re-evaluate hearing per ENT request, sooner should concerns arise    Barnie Crazier, Au.D.    Access your Audiogram via MyChart:  - Log into myChart: Menu > Document Center > Medical Record Requests  - Click the hyperlink 'Request Medical Records'   - Fill out fields + Select Audiograms option under 'Specific Diagnostic Images' > Submit    Charges associated with this visit:  HC TYMPANOMETRY  HC COMP AUDIO EVAL & SPEECH RECOG

## 2024-01-28 NOTE — Progress Notes (Signed)
 Otolaryngology Return Visit    HPI:  Emily Walter is a 73 y.o. female is seen in consultation at the request of [name], MD, for evaluation of tinnitus. Roughly 1 month ago she had the onset of hearing loss and tinnitus on the left side.  This was associated with a 'swimmy headed' and off balance sensation. She was prescribed meclizine  and MRI was ordered.  She was referred here for further evaluation.     She states that about 3 weeks ago she had the acute onset of dizziness as well as decreased hearing.  She had issues with nasal congestion about a month ago.  No GI symptoms.  She first noticed her hearing loss as she held her cell phone after that side and could not hear anything.  She feels like her hearing has not changed over the past 3 weeks.  She has continued issues with dizziness.  She took meclizine  but this actually made her symptoms worse.  She has tinnitus on that left side, high-pitched noise.  Denies any previous issues with otalgia, otorrhea, ear infections.  No previous ear surgery.     She denies history of diabetes.  She has previously taken steroids for COPD.  She has a allergy alert to prednisone  in her chart, but she states that the last time she took this she did well with no issue.     04/18/22- Ended up discontinuing prednisone  due to headaches, throat hurting, dizziness.  No changes in hearing.  Continued issues with imbalance.     05/02/22- Last visit had IT dex injection.  Since last seen she had her kidney procedure and has had some issues recovering from this.  She denies any issues with her ears, and denies any significant changes in her hearing.    08/02/22- Presents today with repeat hearing test. No recent issues with pain or drainage in ears. Hearing stable since last visit.  Still ongoing dizziness, worsened with position changes. She is scheduled to see Vestibular Therapy on July 1st.     10/25/23: This patient previously saw Dr. Sebastian, here today for issues with dizziness. She has been experiencing dizziness characterized by a sensation of unsteadiness since her sudden hearing loss in her left ear in March 2024. The dizziness has not improved and is exacerbated by exposure to sunlight. No issue with room spinning, vertigo or lightheaded sensations. She uses a cane for stability when walking and requires assistance with activities such as showering to prevent falls. She has experienced multiple falls, resulting in significant bruising, and is unable to bend over without falling. She was previously referred to balance therapy at [facility] in [place], where she attended three to four sessions without improvement.    She has a history of migraines which resolved after starting blood pressure medication in her forties.  Her current medications include Ojjaara  for myelofibrosis, which she started approximately five to six months ago. She has a history of myelofibrosis, an enlarged spleen, and low platelet counts, for which she receives regular monitoring and treatment at a cancer center. She is in the process of moving from [facility] to [place] to be closer to family.    01/28/24: The patient is here today for right sudden hearing loss.   History of Present Illness  The patient is here today for decreased hearing in her right ear over the past week. The patient is here today with her husband, who provided the history since the patient can't hear. Since her left  SSNHL a year ago, she has primarily been hearing out of her right ear. About a week ago, she developed sudden and progressing worsening hearing loss in her right ear, and now can barely hear anything. No trauma or fall around that onset of ear symptom, but she did experience two falls about a year ago. She is currently not on any blood thinners. Hearing loss was not preceded by any illness, sinus symptoms, allergies, or upper respiratory infection. She has seen her PCP, and they have ordered a CT Head, and this is scheduled for 12/30. She is currently not wearing any amplification for her hearing loss.     Exam:  General: well appearing, stated age, no distress   Head - atraumatic, normocephalic   Nose: dorsum midline, no rhinorrhea  Neck: symmetric, trachea midline  Psychiatric: alert and oriented, appropriate mood and affect   Respiratory: no audible wheezing or stridor, normal work of breathing  Neurologic - cranial nerves 2-12 grossly intact  Facial Strength - HB 1/6 bilaterally    Ears - External ear- normal, no lesions, no malformations   Otoscopy - Bilateral EAC clear, TM Intact, with middle ear with hemotympanum  Tuning fork test - Weber NA    Audiogram: The audiogram (01/28/24) was personally reviewed and interpreted. This shows moderate to profound mixed hearing loss in the right ear and profound hearing loss in the left ear.       (10/29/23)            Tympanogram: The tympanogram was personally reviewed and interpreted. This demonstrates type B bilaterally    MRI Brain w/ w/o contrast: 2024  Impression      Mild periventricular white matter changes, nonspecific but likely presenting chronic microvascular ischemia. Otherwise unremarkable MRI of the brain.      No acute appearing intracranial abnormality.       Assessment/Plan:  Emily Walter is a 73 y.o. female with a history of left-sided sudden sensorineural hearing loss, here today for right-sided sudden hearing loss since a week ago. On exam, bilateral middle ear with hemotympanum. Patient denies recent falls, trauma, or currently taking any blood thinner. Given a significant decline in hearing in her only hearing ear, we discussed the option for referral to ENT surgeon for possible bilateral myringotomy, which they are interested in. Of note, she is scheduled for CT head on 12/30, and we will try to get her schedule with ENT surgeon after imaging. In the meantime, we discussed nasal rinses, Flonase, and oral antihistamine.     The patient/family voiced understanding of the plan as detailed above and is in agreement. I appreciate the opportunity to participate in her care.    I attest to the above information and documentation. However, this note has been created using voice recognition software and may have errors that were not dictated and not seen in editing.

## 2024-01-29 MED FILL — OJJAARA 100 MG TABLET: ORAL | 30 days supply | Qty: 30 | Fill #1

## 2024-01-30 DIAGNOSIS — D7581 Myelofibrosis: Principal | ICD-10-CM

## 2024-02-01 DIAGNOSIS — H9191 Unspecified hearing loss, right ear: Principal | ICD-10-CM

## 2024-02-01 NOTE — Telephone Encounter (Signed)
 Per neuroradiology, CT temp bone ordered.

## 2024-02-04 ENCOUNTER — Emergency Department
Admit: 2024-02-04 | Discharge: 2024-02-04 | Disposition: A | Discharge status: Home / Self Care | Payer: Medicare (Managed Care)

## 2024-02-04 DIAGNOSIS — H748X3 Other specified disorders of middle ear and mastoid, bilateral: Principal | ICD-10-CM

## 2024-02-04 DIAGNOSIS — M542 Cervicalgia: Principal | ICD-10-CM

## 2024-02-04 DIAGNOSIS — H9191 Unspecified hearing loss, right ear: Principal | ICD-10-CM

## 2024-02-04 LAB — BASIC METABOLIC PANEL
ANION GAP: 12 mmol/L (ref 5–14)
ANION GAP: 12 mmol/L (ref 5–14)
BLOOD UREA NITROGEN: 33 mg/dL — ABNORMAL HIGH (ref 9–23)
BUN / CREAT RATIO: 15
CALCIUM: 9.9 mg/dL (ref 8.7–10.4)
CALCIUM: 9.8 mg/dL (ref 8.7–10.4)
CHLORIDE: 99 mmol/L (ref 98–107)
CHLORIDE: 99 mmol/L (ref 98–107)
CO2: 32.8 mmol/L — ABNORMAL HIGH (ref 20.0–31.0)
CO2: 32.7 mmol/L — ABNORMAL HIGH (ref 20.0–31.0)
CREATININE: 2.14 mg/dL — ABNORMAL HIGH (ref 0.55–1.02)
EGFR CKD-EPI (2021) FEMALE: 24 mL/min/1.73m2 — ABNORMAL LOW (ref >=60)
GLUCOSE RANDOM: 104 mg/dL (ref 70–179)
GLUCOSE RANDOM: 103 mg/dL (ref 70–179)
POTASSIUM: 4.1 mmol/L (ref 3.4–4.8)
SODIUM: 144 mmol/L (ref 135–145)
SODIUM: 144 mmol/L (ref 135–145)

## 2024-02-04 MED ADMIN — oxyCODONE (ROXICODONE) immediate release tablet 5 mg: 5 mg | ORAL | @ 21:00:00 | Stop: 2024-02-04

## 2024-02-04 MED ADMIN — diazePAM (VALIUM) injection 5 mg: 5 mg | INTRAVENOUS | @ 18:00:00 | Stop: 2024-02-04

## 2024-02-04 MED ADMIN — lidocaine (ASPERCREME) 4 % 1 patch: 1 | TRANSDERMAL | @ 14:00:00 | Stop: 2024-02-04

## 2024-02-04 MED ADMIN — oxyCODONE (ROXICODONE) immediate release tablet 5 mg: 5 mg | ORAL | @ 14:00:00 | Stop: 2024-02-04

## 2024-02-04 NOTE — ED Progress Note (Signed)
 ED Progress Note  Received sign out from previous provider.    Patient Summary: Emily Walter is a 73 y.o. female who presents with neck pain and hearing loss. She has been experiencing neck pain for the past few days, which has progressively worsened. The pain is bilateral, extending to the back of her head, and is exacerbated by movement, making it difficult for her to turn her neck or change positions. The pain in the back of her head began this morning. She has a history of hearing loss in her left ear for over a year, which recently became painful. Approximately ten days ago, she began experiencing hearing loss in her right ear as well. She was told by the ENT that there was blood behind both eardrums, and she is scheduled for a CT scan and consultation with an ear surgeon.  Action List:   Follow CT temporal bones  Reassess pain and likely discharge    Updates  ED Course as of 02/04/24 1605   Mon Feb 04, 2024   1516 CT temporal bone showing:  Complete opacification of the bilateral middle ears, consistent with known hemotympanum. No acute fractures.   1604 Patient's pain controlled on reevaluation, she has oxycodone  at home that she will take until her appointment tomorrow with ENT for removal of chronic hemotympanum.   [id number] Patient is in agreement with treatment plan, given strict ED return precautions, will follow-up with ENT tomorrow.

## 2024-02-04 NOTE — Telephone Encounter (Signed)
 Upcoming Appt:  Future Appointments   Date Time Provider Department Center   02/05/2024 11:30 AM HBR CT RM 2 [facility] [place] - [facility]   02/05/2024  2:30 PM Bogard, Jenkins Velia Medley, MD [facility]   02/18/2024  7:30 AM [facility]   02/18/2024  8:30 AM ON[facility] CHAIR 18 [facility]   04/16/2024 12:45 PM Adams, Josette CROME, DO [facility]   04/21/2024  1:10 PM Marnie Emmie Dais, MD [facility]   04/28/2024 12:40 PM [facility] AUDIOLOGIST [facility] [place] [facility]   04/28/2024  1:15 PM Thi Nivia Gosselin, FNP [facility]   05/14/2024 12:00 PM [facility] US  RM 2 [facility] [place] - [facility]   05/21/2024  2:30 PM Renato Toya Norris, PA [facility]       Disposition:  Call PCP When Office is Open    Triage documentation reviewed and Disposition present?  yes    Encounter Reason for Disposition:    [1] Caller requesting NON-URGENT health information AND [2] PCP's office is the best resource      Encounter Initial Assessment:  1. REASON FOR CALL: What is the main reason for your call? or How can I best help you?      Pt. In hospital currently.  Will not be at infusion appt. This am.   2. SYMPTOMS : Do you have any symptoms?       In hospital.   3. OTHER QUESTIONS: Do you have any other questions?      no      Encounter Protocols Used:  Information Only Call - No Triage-A-AH

## 2024-02-04 NOTE — ED Triage Note (Signed)
 Pt reports neck pain that started 2 days ago. Denies fall or any other injury. Pain worse w movement.

## 2024-02-04 NOTE — Procedures (Signed)
 ULTRASOUND PIV PROCEDURE NOTE    Indications:   Poor venous access.    Ultrasound guidance was necessary to obtain access.     Procedure Details:  Identity of the patient was confirmed via name, medical record number and date of birth. The availability of the correct equipment was verified.    The vein was identified and measured for ultrasound catheter insertion.       Vein measurement (without tourniquet):   0.25 cm   A(n) 20 gauge 1.88 catheter was selected based on the recommendations below:    [facility] Catheter/Vein Ratio Guidelines   Chart to determine PIV catheter size/length to use based on vein diameter and depth   Catheter Gauge Size (g)  22g 20g 18g   Catheter length (inches)  1.75 1.75 1.75   Catheter diameter measurement (mm) 0.9 mm 1.1 mm 1.3 mm          Minimum required vein diameter       Sonosite (cm)  0.18 cm 0.22 cm 0.26 cm                  (mm)  1.8 mm  2.2 mm  2.6 mm   Maximum vein depth  1.25 cm 1.25 cm 1.25 cm          The field was prepared with necessary supplies and equipment.  Probe cover and sterile gel utilized. Insertion site was prepped with chlorhexidineand allowed to dry.  The catheter extension was primed with normal saline.  The US  PIV was placed in the R Upper Arm with 1attempt(s). See LDA for additional details.    Catheter aspirated, 3 mL blood return present. The catheter was then flushed with 10 mL of normal saline. Insertion site cleansed, and dressing applied per manufacturer guidelines. The catheter was inserted without difficulty by Adine Negri, RN.    Thank you,     Adine Negri, RN    Ultrasound Resource Nurse    Workup / Procedure Time:  30 minutes

## 2024-02-04 NOTE — ED Provider Notes (Signed)
 [facility]  Emergency Department Provider Note      ED Clinical Impression       Diagnosis ICD-10-CM Associated Orders   1. Cervical spine pain  M54.2       2. Acute hearing loss of right ear  H91.91       3. Hemotympanum, bilateral  H74.8X3                Impression, Medical Decision Making, Progress Notes and Critical Care      Impression, Differential Diagnosis and Plan of Care    This is a 73 year old female with past medical history of hypertension, hyperlipidemia, no fibrosis, CKD, cancer, asthma, heart failure who presents to the ED with complaints of cervical spinal pain with associated occipital pain.  For the last 2 days.    On initial assessment patient is chronically ill-appearing, no acute distress.  She appears uncomfortable secondary to pain.  She is afebrile and hemodynamically stable.  Heart sounds are present regular rate and rhythm with lungs clear on auscultation.  There is significant tenderness on palpation to the paraspinal regions of the cervical spine bilaterally.  Additionally, she is extremely tender to palpation in the occipital region of her head.  Otherwise neurologic exam is reassuring with pupils equal reactive to light, EOM intact, no facial asymmetry.  Sensation and strength consistent with baseline bilateral upper and lower extremities.  Per patient's partner she has been ambulatory and was consistent with her baseline.  She is noted to have blue tympanic membranes bilaterally consistent with hemotympanum which she is being followed for on outpatient basis with ENT per her partner.    Differential diagnoses include but are not limited to musculoskeletal pain however, given the lack of any clear mechanism of injury at this may be less likely.  Meningitis or cervical spinal hematoma, cervical spinal abscess also considered but she has been largely afebrile throughout the onset of her symptoms.  Intracranial malady such as intracerebral hemorrhage, increased intracranial pressure secondary to mass effect from ventriculomegaly, intracranial mass also considered and will obtain appropriate imaging.    Prior to final imaging reads becoming available patient was signed out to oncoming EM resident to final disposition, anticipate discharge with ENT follow up as scheduled.         Additional MDM Elements         Discussion with other professionals: None                 Portions of this record have been created using Dragon dictation software. Dictation errors have been sought, but may not have been identified and corrected.    See chart and nursing documentation for additional ED course details.    ____________________________________________         History        Reason for Visit  Neck Pain      History of Present Illness  Emily Walter is a 73 year old female who presents with neck pain and chronic hearing loss.    She has been experiencing neck pain for the past few days, which has progressively worsened. The pain is bilateral, extending to the back of her head, and is exacerbated by movement, making it difficult for her to turn her neck or change positions. The pain in the back of her head began this morning.    She has a history of hearing loss in her left ear for over a year, which recently became painful. Approximately ten  days ago, she began experiencing hearing loss in her right ear as well. She was told by the ENT that there was blood behind both eardrums, and she is scheduled for a CT scan and consultation with an ear surgeon tomorrow.    She experiences persistent dizziness, which started with the initial hearing loss in her left ear. Despite therapy, the dizziness has not resolved. No vision changes, numbness, or tingling are reported.    She has a history of falls, with a significant fall a few months ago resulting in a slight concussion. Since then, she has been closely monitored to prevent further falls.    She is currently on antibiotics, with one dose remaining, initially prescribed for suspected bladder irritation. No recent fever or feeling feverish. She has a known allergy to fentanyl, identified following a reaction post-kidney stone surgery in March 2025.        Outside Historian(s)  (EMS, Significant Other, Family, Parent, Caregiver, Friend, Patent Examiner, etc.)    Partner    Past Medical History[1]    Past Surgical History[2]      Current Facility-Administered Medications:     lidocaine (ASPERCREME) 4 % 1 patch, 1 patch, Transdermal, Once, Emily Fayette M, DO, 1 patch at 02/04/24 9085    Current Outpatient Medications:     albuterol  HFA 90 mcg/actuation inhaler, Inhale 2 puffs every six (6) hours as needed for wheezing or shortness of breath., Disp: 8 g, Rfl: 3    cholecalciferol , vitamin D3-50 mcg, 2,000 unit,, 50 mcg (2,000 unit) tablet, Take 1 tablet (50 mcg total) by mouth in the morning., Disp: , Rfl:     diclofenac  sodium (VOLTAREN ) 1 % gel, Apply 4 g topically four (4) times a day as needed for arthritis or pain (Knee pain)., Disp: 100 g, Rfl: 2    fluticasone propionate (FLONASE) 50 mcg/actuation nasal spray, 2 sprays into each nostril daily as needed for allergies., Disp: , Rfl:     furosemide  (LASIX ) 40 MG tablet, Take 1 tablet (40 mg total) by mouth daily., Disp: 90 tablet, Rfl: 2    levocetirizine (XYZAL) 5 MG tablet, Take 1 tablet (5 mg total) by mouth every evening., Disp: 90 tablet, Rfl: 2    levoFLOXacin  (LEVAQUIN ) 750 MG tablet, Take 1 tablet (750 mg total) by mouth daily., Disp: 10 tablet, Rfl: 0    lidocaine (LIDODERM) 5 % patch, Place 1 patch on the skin daily. Apply to affected area for 12 hours only each day (then remove patch), Disp: , Rfl:     lovastatin  (MEVACOR ) 40 MG tablet, Take 1 tablet (40 mg total) by mouth daily., Disp: 90 tablet, Rfl: 2    methylPREDNISolone  (MEDROL  DOSEPACK) 4 mg tablet, Take 1 tablet (4 mg total) by mouth daily for 365 doses. follow package directions, Disp: 1 each, Rfl: 0    miscellaneous medical supply Misc, , Disp: , Rfl:     momelotinib (OJJAARA ) 100 mg tablet, Take 1 tablet (100 mg total) by mouth daily. Swallow tablets whole; do not cut, crush, or chew., Disp: 30 tablet, Rfl: 5    NARCAN 4 mg/actuation nasal spray, 1 spray into alternating nostrils once as needed., Disp: , Rfl: 0    omeprazole  (PRILOSEC) 40 MG capsule, Take 1 capsule (40 mg total) by mouth daily., Disp: 90 capsule, Rfl: 3    ondansetron  (ZOFRAN -ODT) 4 MG disintegrating tablet, Dissolve 1 tablet (4 mg total) in the mouth every eight (8) hours as needed for nausea., Disp: 30 tablet, Rfl: 2  oxyCODONE -acetaminophen  (PERCOCET) 10-325 mg per tablet, Take 1 tablet by mouth every six (6) hours as needed for pain., Disp: , Rfl:     [START ON 02/20/2024] oxyCODONE -acetaminophen  (PERCOCET) 10-325 mg per tablet, Take 1 tablet by mouth every 8-12 hours as needed for pain, Disp: 75 tablet, Rfl: 0    sertraline  (ZOLOFT ) 25 MG tablet, Take 1 tablet (25 mg total) by mouth daily., Disp: 90 tablet, Rfl: 3    spironolactone  (ALDACTONE ) 25 MG tablet, Take 1 tablet (25 mg total) by mouth daily., Disp: 90 tablet, Rfl: 2    ULTRA-LIGHT ROLLATOR Misc, Use as directed., Disp: , Rfl: 0    Allergies  Fentanyl, Codeine, Corticosteroids (glucocorticoids), Gabapentin, Prednisone , and Pregabalin    Family History[3]    Short Social History[4]       Physical Exam     ED Triage Vitals [02/04/24 0703]   Enc Vitals Group      BP 134/57      Pulse 82      SpO2 Pulse       Resp 20      Temp 36.5 ??C (97.7 ??F)      Temp Source Skin      SpO2 91 %      Weight 93 kg (205 lb)      Height       Head Circumference       Peak Flow       Pain Score       Pain Loc       Pain Education       Exclude from Growth Chart        Constitutional: Alert and oriented. Uncomfortable appearing secondary to symptoms and in no distress.  Eyes: Conjunctivae are normal. EOM, PEARL  ENT       Head: Normocephalic and atraumatic.       Nose: No congestion.       Mouth/Throat: Mucous membranes are moist.       Neck: No stridor.  Cardiovascular: Normal rate, regular rhythm.   Respiratory: Normal respiratory effort. Breath sounds are normal.  Gastrointestinal: Soft and nontender.   Musculoskeletal: Normal range of motion in all extremities.Bilateral paraspinal tenderness on palpation       Right lower leg: No tenderness or edema.       Left lower leg: No tenderness or edema.  Neurologic: Normal speech and language. No gross focal neurologic deficits are appreciated. No facial asymmetry, sensation intact on all facial nerve distributions. CN II-XII intact. Sensation and strength in bilateral upper and lower extremities intact.  Skin: Skin is warm, dry and intact. No rash noted.       Radiology     CT head WO contrast   Final Result   No acute intracranial abnormality.   Bilateral middle ear/mastoid air cell opacification, consistent with known hemotympanum. The surrounding temporal bone is intact without acute fracture.         CT Temporal Bones Wo Contrast   Final Result   Complete opacification of the bilateral middle ears, consistent with known hemotympanum. No acute fractures.      Redemonstrated DJD of the TMJs.      CT Cervical Spine Wo Contrast   Final Result   Multilevel degenerative changes of the cervical spine with segmental ossification of the posterior longitudinal ligament at C4-C7.       No acute fracture or traumatic listhesis of the cervical spine.                    [  1]   Past Medical History:  Diagnosis Date    Anxiety     Arthritis     Asthma (HHS-HCC)     Cancer    (CMS-HCC)     Cataract     Chronic kidney disease     Depression     Difficult intravenous access     GERD (gastroesophageal reflux disease)     Heart failure (CMS-HCC)     HLD (hyperlipidemia)     Hypertension     Myelofibrosis    (CMS-HCC)     Nephrolithiasis     Sleep apnea     Tinnitus    [2]   Past Surgical History:  Procedure Laterality Date    HYSTERECTOMY      PR CYSTO/URETERO W/LITHOTRIPSY &INDWELL STENT INSRT Left 08/21/2018    Procedure: priority CYSTOURETHROSCOPY, WITH URETEROSCOPY AND/OR PYELOSCOPY; WITH LITHOTRIPSY INCLUDING INSERTION OF INDWELLING URETERAL STENT;  Surgeon: Nicholaus Mt Viprakasit, MD;  Location: CYSTO PROCEDURE SUITES [facility];  Service: Urology    PR CYSTO/URETERO/PYELOSCOPY, CALCULUS TX Left 09/04/2018    Procedure: CYSTOURETHROSCOPY, W/URETEROSCOPY &/OR PYELOSCOPY; W/REMOVAL/MANIPULATION CALCULUS(URETERAL CATH INCLUDED);  Surgeon: Nicholaus Mt Viprakasit, MD;  Location: CYSTO PROCEDURE SUITES [facility];  Service: Urology    PR CYSTOSCOPY,INSERT URETERAL STENT Left 05/04/2022    Procedure: CYSTOURETHROSCOPY,  WITH INSERTION OF INDWELLING URETERAL STENT (EG, GIBBONS OR DOUBLE-J TYPE);  Surgeon: Drena Darryle Gull, MD;  Location: MAIN OR [facility];  Service: Urology    PR CYSTOSCOPY,REMV CALCULUS,SIMPLE Left 09/04/2018    Procedure: CYSTOURETHROSCOPY, WITH REMOVAL OF FOREIGN BODY, CALCULUS OR URETERAL STENT FROM URETHRA OR BLADDER; SIMPLE;  Surgeon: Nicholaus Mt Viprakasit, MD;  Location: CYSTO PROCEDURE SUITES [facility];  Service: Urology    PR CYSTOURETHROSCOPY,URETER CATHETER Left 08/21/2018    Procedure: CYSTOURETHROSCOPY, W/URETERAL CATHETERIZATION, W/WO IRRIG, INSTILL, OR URETEROPYELOG, EXCLUS OF RADIOLG SVC;  Surgeon: Nicholaus Mt Viprakasit, MD;  Location: CYSTO PROCEDURE SUITES [facility];  Service: Urology    PR CYSTOURETHROSCOPY,URETER CATHETER Left 09/04/2018    Procedure: CYSTOURETHROSCOPY, W/URETERAL CATHETERIZATION, W/WO IRRIG, INSTILL, OR URETEROPYELOG, EXCLUS OF RADIOLG SVC;  Surgeon: Nicholaus Mt Viprakasit, MD;  Location: CYSTO PROCEDURE SUITES [facility];  Service: Urology    PR PERQ DILATION XST TRC NEW ACCESS RENAL COLTJ SYS Left 04/24/2022    Procedure: DILATION OF EXISTING TRACT, PERC, FOR ENDOUROLOGIC PROC INCL IMAGING GUIDANCE & RADIOLOGIC SUPERVISION & INTERPRET, W POSTPROC TUBE PLACEMENT, WHEN PERF; INCL NEW ACCESS INTO RENAL COLLECTING SYSTEM;  Surgeon: Mitch Alm Pickles, MD;  Location: [facility] OR [facility]; Service: Urology    PR PERQ NL/PL LITHOTRP COMPLEX >2 CM MLT LOCATIONS Left 04/24/2022    Procedure: standard PCNL prone vs. standard; need fortec;  Surgeon: Mitch Alm Pickles, MD;  Location: [facility] OR [facility];  Service: Urology    PR REMOVE BLADDER STONE,<2.5 CM N/A 08/21/2018    Procedure: LITHOLAPAXY: CRUSH/FRAGMENT CALCULUS, BY ANY MEANS IN BLADDER & REMOVAL OF FRAGMENTS; SIMPLE/SMALL <2.5 CM;  Surgeon: Nicholaus Mt Viprakasit, MD;  Location: CYSTO PROCEDURE SUITES [facility];  Service: Urology    SALPINGOOPHORECTOMY Right     12/07/2010    VAGINAL HYSTERECTOMY      12/07/2010   [3]   Family History  Problem Relation Age of Onset    Hypertension Mother     Cancer Brother         bone    Hypertension Brother     Hypertension Brother     Hypertension Brother     Anesthesia problems Neg Hx     Bleeding Disorder Neg Hx  Breast cancer Neg Hx     Ovarian cancer Neg Hx     Colon cancer Neg Hx     Endometrial cancer Neg Hx    [4]   Social History  Tobacco Use    Smoking status: Never    Smokeless tobacco: Never   Vaping Use    Vaping status: Never Used   Substance Use Topics    Alcohol use: No    Drug use: No        Willodean Rocky HERO, DO  Resident  02/04/24 1747

## 2024-02-05 MED ORDER — CIPROFLOXACIN 0.3 % EYE DROPS
1 refills | 0.00000 days
Start: 2024-02-05 — End: ?

## 2024-02-07 DIAGNOSIS — D7581 Myelofibrosis: Principal | ICD-10-CM

## 2024-02-11 DIAGNOSIS — D7581 Myelofibrosis: Principal | ICD-10-CM

## 2024-02-11 DIAGNOSIS — M1711 Unilateral primary osteoarthritis, right knee: Principal | ICD-10-CM

## 2024-02-11 DIAGNOSIS — M48062 Spinal stenosis, lumbar region with neurogenic claudication: Principal | ICD-10-CM

## 2024-02-11 NOTE — Telephone Encounter (Signed)
 External Referral Request     Requestor:     Confirmation of Order Placed: Yes    Confirmation that Patient is Aware: Yes    Priority Request: Routine (within 72 hours)     Preferred Facility and Location: [facility] / [place], [place]  [address], [place], [place] [address]  Phone: [phone]    Preferred Timing Until Referral/Imaging/Testing Appointment: Appointment needed within next 7 business days     Appointment Request: Referral to be arranged, patient to be notified of referral location and information, patient to make any appointments     Thanks,  Delon MARLA Bring, RN

## 2024-02-11 NOTE — Progress Notes (Signed)
 RETURN ENT VISIT    Assessment/Plan:     Right ear hearing loss with otorrhagia and effusion    She presents with acute, severe right ear hearing loss accompanied by otorrhagia and bilateral middle ear effusion of unclear etiology, without history of trauma or recent air travel. The hearing loss started within the past week and a half, causing significant pain and functional impairment.   - Placed collar button tympanostomy tube  in the left ear to drain effusion and blood and potentially improve hearing.  - Applied phenol for tympanic membrane anesthesia prior to tube placement.  - Attempted suctioning of blood clots from the middle ear during the procedure.  - Placed instillation of Floxacin drops in the left ear post-procedure.  - Initially the preferred tube was unavailable, and obtained it from [facility] office;   - Discussed risk of tube obstruction from blood clots and uncertainty regarding ongoing bleeding.  - Deferred intervention on the right ear pending outcome of left ear procedure.    Oral candidiasis  She has acute oral candidiasis, likely secondary to recent antibiotic therapy, presenting with thick, desiccated saliva and white plaques throughout the oral mucosa, resulting in poor appetite and dysgeusia.  - Prescribed antifungal therapy for oral candidiasis, with options including Diflucan (fluconazole), selected based on contraindications.  - Provided education regarding antibiotic-associated etiology and anticipated improvement with treatment.    Left ear hearing loss, chronic  She has chronic, severe sudden hearing loss left ear hearing loss persisting for approximately 18 months following an episode of vertigo and hearing loss after a nap. Previous interventions, including transtympanic steroid injections,  did not resulted in improvement.  - Deferred intervention on the right ear until outcome of left ear tympanostomy tube placement is assessed.  - Will consider tympanostomy tube placement in the right ear in 1 week assuming no problems with her left tube    - patient wears hearing aid in the   -CT scans of cervical spine without contrast, temporal bones without contrast and CT head without contrast did not shed light on why patient has so much pain in her head and neck.  Mild multilevel degenerative changes of cervical spine with segmental ossification of posterior longitudinal ligament are present  No acute fractures or traumatic listhesis  Temporal bone CT shows complete opacification of the middle ear, consistent with hemotympanum.  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  Head CT shows only bilateral middle ear /mastoid cell opacification.      Subjective:      History of Present Illness    Emily Walter is a 74 year old female with myelofibrosis who presents with acute right ear hearing loss and otorrhagia.    Right ear hearing loss and otorrhagia  - Acute onset of right ear hearing loss approximately 1.5 weeks ago  - Associated with otorrhagia and middle ear effusion  - Hearing loss is severe, requiring loud and direct speech for perception  - Persistent difficulty understanding speech  - No preceding barotrauma    Head, neck, and shoulder pain  - Severe pain in head, neck, and shoulders, progressively worsening over the past few days  - Pain exacerbated by standing or bending over  - Limits ability to move head or neck and raise arms  - Requires assistance with ambulation and activities of daily living  - Onset of pain concurrent with right ear symptoms  - Lesion present on left facial area, appeared at same time as right ear symptoms  Left ear hearing loss  - Chronic severe left ear hearing loss, stable for one year  - Onset followed an episode of awakening with vertigo and complete hearing loss in left ear  - Multiple evaluations and transtympanic steroid injections without improvement  - Adapted to left ear hearing deficit  - Wears [employer] hearing aid in the left ear    Oral candidiasis and oral lesions  - Oral candidiasis with thick, desiccated saliva and dysgeusia  - Worsening appetite over the past few days  - New lesion on tongue  - Poor dentition  - Remote history of tonsillectomy    Hematologic and musculoskeletal comorbidities  - Myelofibrosis on [place]  therapy for splenomegaly  - Known thrombocytopenia and anemia  - Degenerative arthritis and calcification of cervical spine ligaments on prior imaging  - No recent fractures             Objective:     Past Medical History:  Past Medical History[1]    Past Surgical History:  Past Surgical History[2]    Medications:  @ACTMED @    Allergies:  Allergies[3]    Family History:  Family History[4]    Social History:  Short Social History[5]    Review of Systems:  A full review of systems is documented and negative except as noted in HPI. Patient intake form reviewed.       Physical Exam:   VITAL SIGNS: There were no vitals filed for this visit.     General-patient in distress with movement and needs help and assistance from her husband to walk into exam room along with walker and assistance going to the bathroom.  When sitting in exam room chair she has extreme pain in her neck and back.  Patient moaning in pain and not comfortable in any position  Head/Face: On external examination there is no obvious asymmetry or scars. On palpation there is no tenderness over maxillary sinuses or masses within the salivary glands. Cranial nerves V and VII are intact through all distributions.  Eyes: PERRL, EOMI, the conjunctiva are not injected and sclera is non-icteric.  Ears:   Right ear: On external exam, there is no obvious lesions or asymmetry. no cerumen. No lesions in the EAC. Hemotympanum present  Left ear: On external exam, there is no obvious lesions or asymmetry. no cerumen. No lesions in the EAC.  Hemotympanumhemotympanum present  Hearing is diminished  Nose:  bilateral inferior turbinate hypertrophy and deviated septum to left  Oral cavity/oropharynx: The mucosa of the lips, gums, hard and soft palate, posterior pharyngeal wall, tongue, floor of mouth, and buccal region are without masses or lesions and are normally hydrated. fair dentition and diffuse yeast throughout the entire oral cavity including buccal mucosa and tongue with erythema and white lesionsdiffuse oral yeastTonsils are are symmetric without any masses or lesions and 1+  PIO:lwjaoz to assess due to gag reflex  Voice: clear   Neck/Lymphatics: There is no asymmetry or masses. Trachea is midline. There is no enlargement of the thyroid or palpable thyroid nodules. There is no palpable lymphadenopathy along the jugulodiagastric, submental, or posterior cervical chains.  Respiratory: No audible wheeze, unlabored respirations.  Neurologic: Cranial nerve???s II-XII are grossly intact. Exam is non-focal.  Cardiovascular: No cyanosis, regular rhythm , no murmur    Procedure: 30566  Left myringotomy with tube.  Procedure was discussed with the patient, Consent signed. The patient was placed in the exam chair and was positioned for optimum visualization of the left tympanic membrane. She  was moderately uncomfortable due to her neck and head pain. The microscope was positioned appropriately. The tympanic membrane was visualized with the aid of the speculum.  An anterior inferior quadrant incision was performed with the #7[facility] blade, disposable. Using the # 3 and #5 otologic suction, bloody discharge was suctioned from the middle ear. The Paparella type II tube was attempted to be placed in the tympanic membrane. When this was unsuccessful, the standard collar button tube ws inserted in the tympanic membrane without problems. Ofloxacin otic drops were inserted into the left ear canal. The patient was returned to the seated position and as able to ambulate with assistance.   Follow up in the office will be in one week for tube placement in the right ear.   Discharge meds include: Cipro  ophthalmic drops, Dexamethasone ophthalmic drops and Diflucan tab 150 mg   # 5.     Audiogram  The audiogram was reviewed. This demonstrates   Bilateral type B tympanograms consistent with abnormal middle ear compliance/function  Right ear : mild - profound mixed hearing loss. Left ear- known profound hearing loss                [1]   Past Medical History:  Diagnosis Date    Anxiety     Arthritis     Asthma (HHS-HCC)     Cancer    (CMS-HCC)     Cataract     Chronic kidney disease     Depression     Difficult intravenous access     GERD (gastroesophageal reflux disease)     Heart failure (CMS-HCC)     HLD (hyperlipidemia)     Hypertension     Myelofibrosis    (CMS-HCC)     Nephrolithiasis     Sleep apnea     Tinnitus    [2]   Past Surgical History:  Procedure Laterality Date    HYSTERECTOMY      PR CYSTO/URETERO W/LITHOTRIPSY &INDWELL STENT INSRT Left 08/21/2018    Procedure: priority CYSTOURETHROSCOPY, WITH URETEROSCOPY AND/OR PYELOSCOPY; WITH LITHOTRIPSY INCLUDING INSERTION OF INDWELLING URETERAL STENT;  Surgeon: Nicholaus Mt Viprakasit, MD;  Location: CYSTO PROCEDURE SUITES [facility];  Service: Urology    PR CYSTO/URETERO/PYELOSCOPY, CALCULUS TX Left 09/04/2018    Procedure: CYSTOURETHROSCOPY, W/URETEROSCOPY &/OR PYELOSCOPY; W/REMOVAL/MANIPULATION CALCULUS(URETERAL CATH INCLUDED);  Surgeon: Nicholaus Mt Viprakasit, MD;  Location: CYSTO PROCEDURE SUITES [facility];  Service: Urology    PR CYSTOSCOPY,INSERT URETERAL STENT Left 05/04/2022    Procedure: CYSTOURETHROSCOPY,  WITH INSERTION OF INDWELLING URETERAL STENT (EG, GIBBONS OR DOUBLE-J TYPE);  Surgeon: Drena Darryle Gull, MD;  Location: MAIN OR [facility];  Service: Urology    PR CYSTOSCOPY,REMV CALCULUS,SIMPLE Left 09/04/2018    Procedure: CYSTOURETHROSCOPY, WITH REMOVAL OF FOREIGN BODY, CALCULUS OR URETERAL STENT FROM URETHRA OR BLADDER; SIMPLE;  Surgeon: Nicholaus Mt Viprakasit, MD;  Location: CYSTO PROCEDURE SUITES [facility];  Service: Urology    PR CYSTOURETHROSCOPY,URETER CATHETER Left 08/21/2018    Procedure: CYSTOURETHROSCOPY, W/URETERAL CATHETERIZATION, W/WO IRRIG, INSTILL, OR URETEROPYELOG, EXCLUS OF RADIOLG SVC;  Surgeon: Nicholaus Mt Viprakasit, MD;  Location: CYSTO PROCEDURE SUITES [facility];  Service: Urology    PR CYSTOURETHROSCOPY,URETER CATHETER Left 09/04/2018    Procedure: CYSTOURETHROSCOPY, W/URETERAL CATHETERIZATION, W/WO IRRIG, INSTILL, OR URETEROPYELOG, EXCLUS OF RADIOLG SVC;  Surgeon: Nicholaus Mt Viprakasit, MD;  Location: CYSTO PROCEDURE SUITES [facility];  Service: Urology    PR PERQ DILATION XST TRC NEW ACCESS RENAL COLTJ SYS Left 04/24/2022    Procedure: DILATION OF EXISTING TRACT, PERC, FOR ENDOUROLOGIC PROC  INCL IMAGING GUIDANCE & RADIOLOGIC SUPERVISION & INTERPRET, W POSTPROC TUBE PLACEMENT, WHEN PERF; INCL NEW ACCESS INTO RENAL COLLECTING SYSTEM;  Surgeon: Mitch Alm Pickles, MD;  Location: [facility] OR [facility];  Service: Urology    PR PERQ NL/PL LITHOTRP COMPLEX >2 CM MLT LOCATIONS Left 04/24/2022    Procedure: standard PCNL prone vs. standard; need fortec;  Surgeon: Mitch Alm Pickles, MD;  Location: [facility] OR [facility];  Service: Urology    PR REMOVE BLADDER STONE,<2.5 CM N/A 08/21/2018    Procedure: LITHOLAPAXY: CRUSH/FRAGMENT CALCULUS, BY ANY MEANS IN BLADDER & REMOVAL OF FRAGMENTS; SIMPLE/SMALL <2.5 CM;  Surgeon: Nicholaus Mt Viprakasit, MD;  Location: CYSTO PROCEDURE SUITES [facility];  Service: Urology    SALPINGOOPHORECTOMY Right     12/07/2010    VAGINAL HYSTERECTOMY      12/07/2010   [3]   Allergies  Allergen Reactions    Fentanyl Anaphylaxis    Codeine Other (See Comments)     Headache, nausea    Corticosteroids (Glucocorticoids) Headache     **ALL STEROIDS**    Gabapentin Other (See Comments)     oversedation    Other Reaction(s): Other (see comments)    oversedation      Other reaction(s): Other (See Comments)   oversedation   oversedation   oversedation    Prednisone  Headache    Pregabalin Rash     Swelling and general fatigue    Other Reaction(s): Other (see comments)    Swelling and general fatigue   Other reaction(s): Other (see comments)   Swelling and general fatigue   Swelling and general fatigue   [4]   Family History  Problem Relation Age of Onset    Hypertension Mother     Cancer Brother         bone    Hypertension Brother     Hypertension Brother     Hypertension Brother     Anesthesia problems Neg Hx     Bleeding Disorder Neg Hx     Breast cancer Neg Hx     Ovarian cancer Neg Hx     Colon cancer Neg Hx     Endometrial cancer Neg Hx    [5]   Social History  Tobacco Use    Smoking status: Never    Smokeless tobacco: Never   Vaping Use    Vaping status: Never Used   Substance Use Topics    Alcohol use: No    Drug use: No

## 2024-02-11 NOTE — Telephone Encounter (Signed)
 Copied from CRM #[id number]. Topic: Care Management - Discuss Care Plan  >> Feb 11, 2024 12:06 PM Reyes RAMAN wrote:      Erskin Quintin Sprague contacted the Communication Center requesting to speak with the care team of Cheryl DEL Shappell to discuss:    Calling to request referral to a local pain management clinic in [place] or [place].    Please contact Mr. Ackroyd at [phone].    Thank you,   Reyes DELENA Acre  [facility]   [phone]

## 2024-02-11 NOTE — Telephone Encounter (Signed)
 13:52 - returned call to [place]. Notified him that referral has been sent to [facility] in [place], [place]. Provided address and phone number: [address], [place], [place] [address]  Phone: [phone].    He verbalized understanding and was appreciative of call. No further needs at this time.

## 2024-02-12 DIAGNOSIS — D7581 Myelofibrosis: Principal | ICD-10-CM

## 2024-02-12 NOTE — Progress Notes (Signed)
 Per Dr. Michaell, I called the patient Emily Walter. I spoke with her husband, to inform them of appointment at [facility] on 02/18/2024@8 :30 am with Dr. Daphne Cave.    RETURN OFFICE VISIT AND PLAN    Assessment/Plan:       Otorrhea/ Bleeding and granulation tissue with left tympanostomy tube     Patient  has persistent left ear  bleeding despite tympanostomy tube placement. Granulation tissue is present behind the right tympanic membrane  and in the middle ear noted at the time of the myringotomy today without evidence of infection, which is atypical. Bleeding from the left ear originates from the ear canal rather than the tube site. The etiology of the granulation tissue and ongoing bleeding remains unclear(perhaps because of the low platelet count). Ongoing bleeding is concerning given her hematologic history, warranting further hematologic evaluation. Prior attempts at larger tube placement were unsuccessful due to anatomic limitations. Current management is focused on maintaining tube patency and controlling bleeding.  - Placed tympanostomy tube in the right ear under local anesthesia; attempted larger tube placement but used a smaller tube Armstrong beveled grommet due to anatomic constraints.  - Performed suctioning  of the left ear and assessed tube and tympanic membrane.  - Instilled Ofloxacin otic drops in both ears post-procedure.  - Instructed her to continue prescribed otic drops Dexamethasone and ciprofloxacin  ophthalmic drops are to be used in each ear until bleeding subsides twice daily to prevent tube occlusion and manage drainage; provided refills and advised continuation as long as bleeding persists, with discontinuation if bleeding resolves.  - Scheduled follow-up in two weeks to reassess bleeding and tube function.  - Planned to notify her hematologist/oncologist regarding ongoing bleeding and recommend physician-level evaluation.            Subjective:      History of Present Illness    Emily Walter is a 74 year old female with chronic bilateral otorrhea status post tympanostomy tube placement who presents with persistent bilateral otorrhea and otorrhagia.    Otorrhea and otorrhagia  - Persistent  left ear bleeding otorrhea and otorrhagia on the left side since tube placement  - Cotton in left ear becomes saturated with blood each time it is changed  - Regular use of prescribed otic drops in both ears twice daily is  instructed  - Instillation of otic drops  in the left ear canal produces a pronounced 'big bang' sensation, possibly related to the temperature of the drops  - No history of otitis media  - Prior emergency department evaluation revealed only middle ear effusion without additional findings    Neck, upper spine, and shoulder discomfort  - Discomfort localized to the mid-neck, upper spine, and shoulders  - Applies topical treatments to both sides of the neck with massage towards the center  - No use of heat patches    Oral symptoms  - History of oral candidiasis, previously treated with Diflucan   - Oral symptoms have resolved  - Able to eat better  - completed course of diflucan but has refills    Gastrointestinal symptoms  - Four episodes of non-bloody emesis yesterday afternoon and last night after eating tomatoes  - No further vomiting this morning             Objective:     Past Medical History:  Past Medical History[1]    Past Surgical History:  Past Surgical History[2]    Medications:  @ACTMED @    Allergies:  Allergies[3]    Family History:  Family History[4]    Social History:  Short Social History[5]    Review of Systems:  A full review of systems is documented and negative except as noted in HPI. Patient intake form reviewed.       Physical Exam:   VITAL SIGNS: There were no vitals filed for this visit.     General: Ill-appearing elderly woman with multiple bruises subcutaneously on arms bilaterally  Head/Face: On external examination there is no obvious asymmetry or scars. On palpation there is no tenderness over maxillary sinuses or masses within the salivary glands.   Eyes: PERRL, EOMI, the conjunctiva are not injected and sclera is non-icteric.  Ears:   Right ear: On external exam, there is no obvious lesions or asymmetry. scant cerumen. No lesions in the EAC. Hemotympanum  Left ear: On external exam, there is no obvious lesions or asymmetry. no cerumen.  There is dark blood draining through the lumen of the myringotomy tube and obscuring the functionality of the tube.  No lesions in the EAC. Residual blood behind the tympanic membrane.  Hearing is diminished  Nose:  bilateral inferior turbinate hypertrophy and septum deviated to the left  Oral cavity/oropharynx: The mucosa of the lips, gums, hard and soft palate, posterior pharyngeal wall, tongue, floor of mouth, and buccal region are without masses or lesions and are normally hydrated.  Resolution of intraoral yeast on tongue, soft palate and buccal mucosa.  Mild erythema of the intraoral mucosa.  IDL:Not performed  Voice: Muffled  Respiratory: No audible wheeze  Neurologic: Cranial nerve???s II-XII are grossly intact with the exception of decreased hearing. Exam is non-focal.  Cardiovascular: No cyanosis, regular rhythm, no murmur    Procedure [id number] Consent was obtained to perform right myringotomy with tube.  Patient advised the same procedure that had been carried out 1 week before on the left ear would not be carried out on the right ear.  Myringotomy with tympanostomy tube placement, right ear  Phenol applied to right tympanic membrane for anesthesia. Myringotomy incision made in right tympanic membrane. Small amount of bleeding present. Granulation tissue visualized behind right tympanic membrane. Attempted placement of larger tympanostomy tube unsuccessful; smaller tube inserted successfully. Otic drops instilled post-procedure.    Ear cleaning, left ear  Blood and debris removed from left external auditory canal. Tympanostomy tube in place. Bleeding localized to ear canal, not from tube site.Ofloxacin  Otic drops instilled.            [1]   Past Medical History:  Diagnosis Date    Anxiety     Arthritis     Asthma (HHS-HCC)     Cancer    (CMS-HCC)     Cataract     Chronic kidney disease     Depression     Difficult intravenous access     GERD (gastroesophageal reflux disease)     Heart failure (CMS-HCC)     HLD (hyperlipidemia)     Hypertension     Myelofibrosis    (CMS-HCC)     Nephrolithiasis     Sleep apnea     Tinnitus    [2]   Past Surgical History:  Procedure Laterality Date    HYSTERECTOMY      PR CYSTO/URETERO W/LITHOTRIPSY &INDWELL STENT INSRT Left 08/21/2018    Procedure: priority CYSTOURETHROSCOPY, WITH URETEROSCOPY AND/OR PYELOSCOPY; WITH LITHOTRIPSY INCLUDING INSERTION OF INDWELLING URETERAL STENT;  Surgeon: Nicholaus Mt Viprakasit, MD;  Location: CYSTO PROCEDURE SUITES [place];  Service: Urology    PR CYSTO/URETERO/PYELOSCOPY, CALCULUS TX Left 09/04/2018    Procedure: CYSTOURETHROSCOPY, W/URETEROSCOPY &/OR PYELOSCOPY; W/REMOVAL/MANIPULATION CALCULUS(URETERAL CATH INCLUDED);  Surgeon: Nicholaus Mt Viprakasit, MD;  Location: CYSTO PROCEDURE SUITES [facility];  Service: Urology    PR CYSTOSCOPY,INSERT URETERAL STENT Left 05/04/2022    Procedure: CYSTOURETHROSCOPY,  WITH INSERTION OF INDWELLING URETERAL STENT (EG, GIBBONS OR DOUBLE-J TYPE);  Surgeon: Drena Darryle Gull, MD;  Location: MAIN OR [facility];  Service: Urology    PR CYSTOSCOPY,REMV CALCULUS,SIMPLE Left 09/04/2018    Procedure: CYSTOURETHROSCOPY, WITH REMOVAL OF FOREIGN BODY, CALCULUS OR URETERAL STENT FROM URETHRA OR BLADDER; SIMPLE;  Surgeon: Nicholaus Mt Viprakasit, MD;  Location: CYSTO PROCEDURE SUITES [facility];  Service: Urology    PR CYSTOURETHROSCOPY,URETER CATHETER Left 08/21/2018    Procedure: CYSTOURETHROSCOPY, W/URETERAL CATHETERIZATION, W/WO IRRIG, INSTILL, OR URETEROPYELOG, EXCLUS OF RADIOLG SVC;  Surgeon: Nicholaus Mt Viprakasit, MD;  Location: CYSTO PROCEDURE SUITES [facility]; Service: Urology    PR CYSTOURETHROSCOPY,URETER CATHETER Left 09/04/2018    Procedure: CYSTOURETHROSCOPY, W/URETERAL CATHETERIZATION, W/WO IRRIG, INSTILL, OR URETEROPYELOG, EXCLUS OF RADIOLG SVC;  Surgeon: Nicholaus Mt Viprakasit, MD;  Location: CYSTO PROCEDURE SUITES [place];  Service: Urology    PR PERQ DILATION XST TRC NEW ACCESS RENAL COLTJ SYS Left 04/24/2022    Procedure: DILATION OF EXISTING TRACT, PERC, FOR ENDOUROLOGIC PROC INCL IMAGING GUIDANCE & RADIOLOGIC SUPERVISION & INTERPRET, W POSTPROC TUBE PLACEMENT, WHEN PERF; INCL NEW ACCESS INTO RENAL COLLECTING SYSTEM;  Surgeon: Mitch Alm Pickles, MD;  Location: [facility] OR [facility];  Service: Urology    PR PERQ NL/PL LITHOTRP COMPLEX >2 CM MLT LOCATIONS Left 04/24/2022    Procedure: standard PCNL prone vs. standard; need fortec;  Surgeon: Mitch Alm Pickles, MD;  Location: [facility] OR [facility];  Service: Urology    PR REMOVE BLADDER STONE,<2.5 CM N/A 08/21/2018    Procedure: LITHOLAPAXY: CRUSH/FRAGMENT CALCULUS, BY ANY MEANS IN BLADDER & REMOVAL OF FRAGMENTS; SIMPLE/SMALL <2.5 CM;  Surgeon: Nicholaus Mt Viprakasit, MD;  Location: CYSTO PROCEDURE SUITES [facility];  Service: Urology    SALPINGOOPHORECTOMY Right     12/07/2010    VAGINAL HYSTERECTOMY      12/07/2010   [3]   Allergies  Allergen Reactions    Fentanyl Anaphylaxis    Codeine Other (See Comments)     Headache, nausea    Corticosteroids (Glucocorticoids) Headache     **ALL STEROIDS**    Gabapentin Other (See Comments)     oversedation    Other Reaction(s): Other (see comments)    oversedation      Other reaction(s): Other (See Comments)   oversedation   oversedation   oversedation    Prednisone  Headache    Pregabalin Rash     Swelling and general fatigue    Other Reaction(s): Other (see comments)    Swelling and general fatigue   Other reaction(s): Other (see comments)   Swelling and general fatigue   Swelling and general fatigue   [4]   Family History  Problem Relation Age of Onset    Hypertension Mother     Cancer Brother         bone    Hypertension Brother     Hypertension Brother     Hypertension Brother     Anesthesia problems Neg Hx     Bleeding Disorder Neg Hx     Breast cancer Neg Hx     Ovarian cancer Neg Hx     Colon cancer Neg Hx     Endometrial cancer Neg Hx    [  5]   Social History  Tobacco Use    Smoking status: Never    Smokeless tobacco: Never   Vaping Use    Vaping status: Never Used   Substance Use Topics    Alcohol use: No    Drug use: No

## 2024-02-14 NOTE — Progress Notes (Signed)
 I called the patient to inform them that I reached out to Dr. Michaell about refills on medication for ear drops. I will let them know soon as I hear from Dr. Michaell.

## 2024-02-15 DIAGNOSIS — D7581 Myelofibrosis: Principal | ICD-10-CM

## 2024-02-16 DIAGNOSIS — H748X3 Other specified disorders of middle ear and mastoid, bilateral: Principal | ICD-10-CM

## 2024-02-16 DIAGNOSIS — D7581 Myelofibrosis: Principal | ICD-10-CM

## 2024-02-16 DIAGNOSIS — D696 Thrombocytopenia, unspecified: Principal | ICD-10-CM

## 2024-02-16 DIAGNOSIS — D689 Coagulation defect, unspecified: Principal | ICD-10-CM

## 2024-02-16 NOTE — Progress Notes (Signed)
 Myeloproliferative Neoplasms Clinic Follow up Visit    Patient: Emily Walter  MRN: [id number]  DOB: 07-15-1950  Date of Visit: 02/18/2024      Reason for Visit   Emily Walter is a 74 y.o. with HFpEF, OSA, DM2, hypertension, CKD 3 (eGFR 52ml/min), staghorn calculi, chronic back pain, and triple negative primary myelofibrosis (higher risk) diagnosed 07/2016 who presents today in early follow up for progressive cytopenias prompting a bone marrow biopsy on 03/03/23.  Momelotinib 100mg  daily since 03/11/23. Emily Walter presents today in expedited hematology follow up after her otolaryngologist noted spontaneous hemotympanum.      Interval History     History of Present Illness    March 2024: sudden hearing loss in left ear accompanied by dizziness and unsteadiness.    At the 11/21/23 visit with [facility] Adjutant, easy bruising was noted. Her current symptoms include easy bruising and low platelet counts, with platelets at 11. She experiences bruising with minimal contact and has had two significant falls in the past, resulting in bruising on her face and back.  01/28/2024:  sudden hearing loss in the right ear noted the week prior.  No preceding fall or trauma, no illness, sinus symptoms, allergies, cough.  Exam by ENT showed hemotympanum.  CT temporal bones without contrast done 02/04/24 with complete opacification of the bilateral middle ears, consistent with known hemotympanum.  No acute fractures. There was no intracranial bleeding. She was referred for bilateral myringotomy.    02/05/24 - left myringotomy and tympanostomy tube placement  02/12/24 - Right myringotomy and tympanostomy tube placement.  Noted that there is bleeding from the left ear canal which is unusual.    Patient presents today in the above context. With the now severe bilateral hearing loss, she had a difficult time hearing questions today.  Her husband provides many of the following details.      No recent epistaxis, but a sensitive area beside the nose developed concurrently with the ear symptoms.    She continues to have marked easy bruising on her arms and legs with minimal trauma, including after venipuncture and minor contact, consistent with severe thrombocytopenia. Bruising is extensive and occurs even with minor bumps. She has received platelet transfusions at [facility], with transfusions administered based on laboratory results; the most recent was withheld due to improved platelet count of >10 x 10^9/L.  She otherwise has not had dark black tarry stools nor obvious blood in the stools.  She doesn't think her urine has been bloody, but notes it can be dark.      She denies use of aspirin, NSAIDs, or vitamin C, and takes vitamin D3. No new falls since the onset of her current symptoms.    Chronic neck and shoulder pain has worsened since the onset of ear symptoms, is severe, interferes with sleep, and is refractory to topical creams and pain patches. She is awaiting a new pain management referral closer to her residence and is currently out of oxycodone , using acetaminophen  until her next refill is available. Pain management has been complicated by changes in clinic location and referral delays.    No fevers.      Physical Examination     Vitals:    02/18/24 1005   BP: 143/62   Pulse: 78   Resp: 17   Temp: 36.6 ??C (97.9 ??F)   SpO2: 100%     Physical Exam  GENERAL: Not ill-appearing, but did need to get up to pace due to upper back  pain during the visit.   Accompanied by her husband.  HEENT: No scleral icterus.  No periorbital purpura.  SKIN: extensive ecchymoses on the bilateral upper extremities, extending well beyond venipuncture site where it started.   NEURO: Very hard of hearing, can hear a little out of right ear.  PSYCH: Normal affect, affect is full and congruent.  No psychomotor agitation nor retardation.  Thought process is goal-directed and linear.      Laboratory Testing and Imaging     Results for orders placed or performed in visit on 02/18/24   Lactate dehydrogenase   Result Value Ref Range    LDH 404 (H) 120 - 246 U/L   Comprehensive Metabolic Panel   Result Value Ref Range    Sodium 142 135 - 145 mmol/L    Potassium 3.6 3.4 - 4.8 mmol/L    Chloride 100 98 - 107 mmol/L    CO2 33.0 (H) 20.0 - 31.0 mmol/L    Anion Gap 9 5 - 14 mmol/L    BUN 18 9 - 23 mg/dL    Creatinine 8.08 (H) 0.55 - 1.02 mg/dL    BUN/Creatinine Ratio 9     eGFR CKD-EPI (2021) Female 27 (L) >=60 mL/min/1.90m2    Glucose 113 70 - 179 mg/dL    Calcium 9.4 8.7 - 89.5 mg/dL    Albumin 3.7 3.4 - 5.0 g/dL    Total Protein 7.7 5.7 - 8.2 g/dL    Total Bilirubin 1.1 0.3 - 1.2 mg/dL    AST 11 <=65 U/L    ALT <7 (L) 10 - 49 U/L    Alkaline Phosphatase 74 46 - 116 U/L   APTT   Result Value Ref Range    APTT 25.7 24.8 - 38.4 sec    Heparin Correlation <0.2    Protime-INR   Result Value Ref Range    PT 14.7 (H) 9.9 - 12.6 sec    INR 1.30    Fibrinogen   Result Value Ref Range    Fibrinogen 501 (H) 175 - 500 mg/dL   CBC w/ Differential   Result Value Ref Range    WBC 8.6 3.6 - 11.2 10*9/L    RBC 4.05 3.95 - 5.13 10*12/L    HGB 10.4 (L) 11.3 - 14.9 g/dL    HCT 66.5 (L) 65.9 - 44.0 %    MCV 82.4 77.6 - 95.7 fL    MCH 25.8 (L) 25.9 - 32.4 pg    MCHC 31.3 (L) 32.0 - 36.0 g/dL    RDW 78.8 (H) 87.7 - 15.2 %    MPV 11.1 (H) 6.8 - 10.7 fL    Platelet 19 (L) 150 - 450 10*9/L    nRBC 13 (H) <=4 /100 WBCs    Neutrophils % 70.5 %    Lymphocytes % 21.9 %    Monocytes % 2.7 %    Eosinophils % 4.4 %    Basophils % 0.5 %    Absolute Neutrophils 6.1 1.8 - 7.8 10*9/L    Absolute Lymphocytes 1.9 1.1 - 3.6 10*9/L    Absolute Monocytes 0.2 (L) 0.3 - 0.8 10*9/L    Absolute Eosinophils 0.4 0.0 - 0.5 10*9/L    Absolute Basophils 0.0 0.0 - 0.1 10*9/L    Anisocytosis Moderate (A) Not Present    Hypochromasia Moderate (A) Not Present   Morphology Review   Result Value Ref Range    Smear Review Comments See Comment Undefined    Polychromasia Slight (A) Not Present  Basophilic Stippling Present (A) Not Present *Note: Due to a large number of results and/or encounters for the requested time period, some results have not been displayed. A complete set of results can be found in Results Review.       Assessment   #Spontaneous right hemotympanum  with CT scan showing bilateral hemotympanum- 01/28/24  #Spontaneous left sudden hearing loss (? Hemotympanum) - 04/2022  #Thrombocytopenia due to primary myelofibrosis    Does have very easy and excessive bruising with minimal trauma from venipuncture sites, otherwise, no apparent bleeding.  Labs today show a platelet count of 19 x 10^9/L.  PT is minimally prolonged at 14.7 seconds, aPTT is normal, thrombin time is normal.  Von Willebrand testing pending, but FVIII is 322% so unlikely to be abnormally low.  Fibrinogen increased to 501 - no evidence for DIC as a cause of coagulopathy.  LFTs are normal.  Serum creatinine remains elevated with eGFR of 49ml/min but BUN is 18 suggesting no significant uremia.  Patient is not taking medications known to cause bleeding including NSAIDs, aspirin.  Momelotinib is not associated with platelet dysfunction nor bleeding and the first hemotympanum occurred prior to initiation of momelotinib.      Impression: no overt reversible coagulopathy identified.  No obvious cause for hemotympanum identified by history and by ENT.  It is possible that this is related to (1) severe thrombocytopenia and/or (2) platelet dysfunction in the context of MDS/MPN.  The patient's platelet count is too low to perform platelet function studies.      Thrombocytopenia in MF and MDS remain problematic for treatment.  Platelet transfusions remain the mainstay of treatment.  Hypomethylating agents (HMAs) can improve megakaryopoiesis in TET2-mutant myeloid neoplasia, but as single agents generally do not improve thrombocytopenia associated with other myeloid mutations.  Notably, venetoclax in addition to HMA does improve overall response rates.  Thrombopoietin mimetics are considered to stimulate platelet production, however, much concern remains surrounding the potential to stimulate leukemogenesis, worsen splenomegaly, and lead to thrombotic events.      Tranexamic acid, an antifibrinolytic, may theoretically improve platelet-mediated hemostasis to reduce thrombocytopenia-associated bleeding.  This is available in pill form.  Unfortunately, tranexamic acid is not recommended in the setting of gross hematuria which was present on clean catch urinalysis today, given that ureters contain high levels of urine plasminogen to keep the ureters from getting clogged with blood.  As such, I cannot safely use tranexamic acid here.    Given the above, I recommend the following:   Maintain platelet count >20 x 10^9/L, possibly >30 x 10^9/L (note this has been difficult) with at least weekly platelet transfusion at [facility].  Transfusion plan updated with goal of 20 x 10^9/L.   No TXA as above.   Supplementation with over-the-counter vitamin C 1000mg  po daily (may serve to reduce bleeding diathesis)  Avoid aspirin, NSAIDs    References:   Chemotherapy-induced thrombocytopenia with hemotympanum.  Clin Med Insights Ear Nose Throat. 2013 Jun 27;6:17-20.     #Primary myelofibrosis, intermediate-2 by DIPSS-Plus, High-risk by MIPSS70-Plus - with dysplastic progression 02/2023  #Splenomegaly - 21cm from 09/13/2023,  19.1cm on CT A/P from 01/14/23; was 15.6cm on  05/2022 and normal in 2020    Has had some symptomatic improvement of splenomegaly with momelotinib.      ? Pacritinib, but would not use with bleeding given the associated warning on the package insert.      #Anemia - Multifactorial, secondary to iron  deficiency, #1, CKD.  Non-transfusion-dependent.  Overall improving with momelotinib.        #Iron  deficiency- Received Infed  1g on 07/14/22, 03/30/23.  Iron  studies from 01/21/24 consistent with early iron  deficiency with ferritin of 80, t sat of 19%, and low normal total iron  of 50.  Hgb at that time was 10.5 g/dL with MCV 84 and wide RDW of 20.7.       Wt Readings from Last 12 Encounters:   02/18/24 85 kg (187 lb 6.3 oz)   02/04/24 93 kg (205 lb)   01/25/24 93 kg (205 lb)   01/22/24 93.2 kg (205 lb 6.4 oz)   01/21/24 91.2 kg (201 lb 1 oz)   12/24/23 91.5 kg (201 lb 11.2 oz)   12/20/23 91.8 kg (202 lb 4.8 oz)   12/17/23 92.4 kg (203 lb 9.6 oz)   11/21/23 92.9 kg (204 lb 12.9 oz)   11/12/23 95 kg (209 lb 6.4 oz)   11/05/23 95.6 kg (210 lb 12.8 oz)   10/19/23 96.6 kg (213 lb)          Plan   Continue momelotinib 100mg  po daily  Dose reduced by 50% due to plts 11, baseline CKD (eGFR 30ml/min). Plan to increase if tolerates.   Indication = PMF with symptomatic splenomegaly + anemia  Monitoring = Labs [facility] CBC-D, CMP - standing orders   Keep platelets >20 x 10^9/L with at least weekly transfusions at [facility]  Avoid aspirin/NSAIDs due to severe thrombocytopenia  Follow up in 1 month.  Scheduled for 03/17/2024 with [facility] Adjutant.     Daphne GEANNIE Cave, M.D.  Associate Professor of Medicine  Division of Hematology    Co-PI, [facility]  Member, [facility]  Member, [facility]    [address]  [address]  [address]  [place] [address]    [email or web address]    Questions and appointments M-F 8am - 5pm: [phone] or [phone]    Myeloproliferative Neoplasms & Bone Marrow Failure Clinical Team  Nurse Navigator: Delon Bring  New Patient Intake Coordinator: Peyton Morgan & Charleen Shams  Return Patient Scheduler: [facility] Leukemia Oncology Return Scheduling    Nurse Practitioner: Asberry Adjutant, AGNP  Clinical Pharmacist: Lindajean Door, CPP    [email or web address]        History of Present Illness     Hematology/Oncology History   MDS/MPN (myelodysplastic/myeloproliferative neoplasms)    (CMS-HCC)   08/03/2016 Initial Diagnosis    PRIMARY MYELOFIBROSIS vs MDS/MPN with fibrosis    Prior labs show:  2014 at [facility]: normal CBC, WBC 6.5, Hgb 15.2, Plts 181.    CBC from 08/03/2016: WBC 10.3 x 10^9/L, Hgb 12.3 g/dL, MCV 85, plt 52 x 89^0/O.    Due to the cytopenias, Emily Walter was sent to Dr. India Barge and underwent a bone marrow biopsy. I currently do not have bone marrow slides to review.  Outside [facility] report from 08/03/2016 states,  Hypercellular bone marrow (85%) with hyperplastic hematopoiesis.  Atypical megakaryocyte hyperplasia with focal clustering.  Abnormal localization of immature myeloid precursors.  No significant increase in blasts.  Mild to moderate reticulin fibrosis.    Driver mutation = SRSF2 (2018)  Karyotype = 46,XX,add(4)(q33),add(17)(q21)[13]     Higher-risk disease, with thrombocytopenia, SRSF2 mutation.      She was not an optimal alloHSCT candidate due to multiple co-morbidities.      She trialed ruxolitinib  but did not have a good response.  07/14/2022:  Received Infed  1g IV for iron  deficiency.     02/21/2023 Progression    MYELODYSPLASTIC PROGRESSION     She has been on observation alone for several years.  She now has progressive cytopenias, with platelets trending down to teens from 40-50's in April 2024 and hgb trending down from 11-12 g/dL in Feb/March 2024 to 9.9 g/dL now.  WBC has progressively increased, was 10 in 05/2022 and now consistently >15.  Myeloid mutation panel with persistent SRSF2 mutation and new ASXL1 mutation with VAF 5%. In this context, a bone marrow biopsy was performed at [facility] on 02/21/23.  This showed persistent myelofibrosis with dysplastic features, consistent with MDS/MPN or perhaps MDS with myelofibrosis.      Karyotype is pending, but prior karyotype 46,XX with add(4) and add(17).       The bone marrow now appears most consistent with MDS.    For prognostic calculations, I used labs from 02/21/23: ANC 15, hgb 9.9, plts 18, bm blasts 2%, age 60yo, intermediate karyotype, SRSF2, ASXL1. IPSS:  Int-1 (1 pt)  IPSS-R: Intermediate. (4pt) 89yr median overall survival.  25% AML transformation 3.2 yrs.   IPSS-M: Moderate-High. Leukemia-free survival 2.3 yrs. Overall survival 2.8 yrs.      03/11/2023 -  Chemotherapy    MOMELOTINIB    Indication = primary myelofibrosis with anemia   Starting dose = 100mg  po daily (reduced for platelet count 18)    03/12/23: WBC 24.7, Hgb 10, Plts 18. sCr 1.34.   04/09/23: WBC 26.6, Hgb 10.6, Plts 17.  sCr 1.34. 4% myelocytes, 4% metamyelocytes.   05/10/23: WBC 21.8, hgb 11.6, plt 18  06/11/23: Patient stopped momelotinib without team's knowledge due to what she considered new side effects  06/18/23: WBC 24.4, hgb 10.4, plt 13, sCr 1.46 - resumed momelotinib 100mg   08/21/23: WBC 27.1, hgb 12.1, hmct 39.7, plt 13, sCr 1.6  11/05/23: WBC 18.3, hgb 10.7, hmct 37, plt 11 began hydrea  500mg  WMF per local onc, stop momelotinib  11/26/23: WNC 15.6, hgb 10.7, plt 16 stop HU, resume momelotinib               Medications     Current Outpatient Medications   Medication Sig Dispense Refill    albuterol  HFA 90 mcg/actuation inhaler Inhale 2 puffs every six (6) hours as needed for wheezing or shortness of breath. 8 g 3    cholecalciferol , vitamin D3-50 mcg, 2,000 unit,, 50 mcg (2,000 unit) tablet Take 1 tablet (50 mcg total) by mouth in the morning.      diclofenac  sodium (VOLTAREN ) 1 % gel Apply 4 g topically four (4) times a day as needed for arthritis or pain (Knee pain). 100 g 2    fluticasone propionate (FLONASE) 50 mcg/actuation nasal spray 2 sprays into each nostril daily as needed for allergies.      furosemide  (LASIX ) 40 MG tablet Take 1 tablet (40 mg total) by mouth daily. 90 tablet 2    levocetirizine (XYZAL) 5 MG tablet Take 1 tablet (5 mg total) by mouth every evening. 90 tablet 2    levoFLOXacin  (LEVAQUIN ) 750 MG tablet Take 1 tablet (750 mg total) by mouth daily. 10 tablet 0    lidocaine (LIDODERM) 5 % patch Place 1 patch on the skin daily. Apply to affected area for 12 hours only each day (then remove patch)      lovastatin  (MEVACOR ) 40 MG tablet Take 1 tablet (40 mg total) by mouth daily. 90 tablet 2  methylPREDNISolone  (MEDROL  DOSEPACK) 4 mg tablet Take 1 tablet (4 mg total) by mouth daily for 365 doses. follow package directions 1 each 0    miscellaneous medical supply Misc       momelotinib (OJJAARA ) 100 mg tablet Take 1 tablet (100 mg total) by mouth daily. Swallow tablets whole; do not cut, crush, or chew. 30 tablet 5    NARCAN 4 mg/actuation nasal spray 1 spray into alternating nostrils once as needed.  0    omeprazole  (PRILOSEC) 40 MG capsule Take 1 capsule (40 mg total) by mouth daily. 90 capsule 3    ondansetron  (ZOFRAN -ODT) 4 MG disintegrating tablet Dissolve 1 tablet (4 mg total) in the mouth every eight (8) hours as needed for nausea. 30 tablet 2    oxyCODONE -acetaminophen  (PERCOCET) 10-325 mg per tablet Take 1 tablet by mouth every six (6) hours as needed for pain.      [START ON 02/20/2024] oxyCODONE -acetaminophen  (PERCOCET) 10-325 mg per tablet Take 1 tablet by mouth every 8-12 hours as needed for pain 75 tablet 0    sertraline  (ZOLOFT ) 25 MG tablet Take 1 tablet (25 mg total) by mouth daily. 90 tablet 3    spironolactone  (ALDACTONE ) 25 MG tablet Take 1 tablet (25 mg total) by mouth daily. 90 tablet 2    ULTRA-LIGHT ROLLATOR Misc Use as directed.  0     No current facility-administered medications for this visit.       Allergies     Allergies   Allergen Reactions    Fentanyl Anaphylaxis    Codeine Other (See Comments)     Headache, nausea    Corticosteroids (Glucocorticoids) Headache     **ALL STEROIDS**    Gabapentin Other (See Comments)     oversedation    Other Reaction(s): Other (see comments)    oversedation      Other reaction(s): Other (See Comments)   oversedation   oversedation   oversedation    Prednisone  Headache    Pregabalin Rash     Swelling and general fatigue    Other Reaction(s): Other (see comments)    Swelling and general fatigue   Other reaction(s): Other (see comments)   Swelling and general fatigue   Swelling and general fatigue       Past Medical and Surgical History     Past Medical History:   Diagnosis Date    Anxiety     Arthritis     Asthma (HHS-HCC)     Cancer    (CMS-HCC)     Cataract     Chronic kidney disease     Depression     Difficult intravenous access     GERD (gastroesophageal reflux disease)     Heart failure (CMS-HCC)     HLD (hyperlipidemia)     Hypertension     Myelofibrosis    (CMS-HCC)     Nephrolithiasis     Sleep apnea     Tinnitus      Past Surgical History:   Procedure Laterality Date    HYSTERECTOMY      PR CYSTO/URETERO W/LITHOTRIPSY &INDWELL STENT INSRT Left 08/21/2018    Procedure: priority CYSTOURETHROSCOPY, WITH URETEROSCOPY AND/OR PYELOSCOPY; WITH LITHOTRIPSY INCLUDING INSERTION OF INDWELLING URETERAL STENT;  Surgeon: Nicholaus Mt Viprakasit, MD;  Location: CYSTO PROCEDURE SUITES [facility];  Service: Urology    PR CYSTO/URETERO/PYELOSCOPY, CALCULUS TX Left 09/04/2018    Procedure: CYSTOURETHROSCOPY, W/URETEROSCOPY &/OR PYELOSCOPY; W/REMOVAL/MANIPULATION CALCULUS(URETERAL CATH INCLUDED);  Surgeon: Nicholaus Mt Viprakasit, MD;  Location: CYSTO PROCEDURE SUITES  [facility];  Service: Urology    PR CYSTOSCOPY,INSERT URETERAL STENT Left 05/04/2022    Procedure: CYSTOURETHROSCOPY,  WITH INSERTION OF INDWELLING URETERAL STENT (EG, GIBBONS OR DOUBLE-J TYPE);  Surgeon: Drena Darryle Gull, MD;  Location: MAIN OR [facility];  Service: Urology    PR CYSTOSCOPY,REMV CALCULUS,SIMPLE Left 09/04/2018    Procedure: CYSTOURETHROSCOPY, WITH REMOVAL OF FOREIGN BODY, CALCULUS OR URETERAL STENT FROM URETHRA OR BLADDER; SIMPLE;  Surgeon: Nicholaus Mt Viprakasit, MD;  Location: CYSTO PROCEDURE SUITES [facility];  Service: Urology    PR CYSTOURETHROSCOPY,URETER CATHETER Left 08/21/2018    Procedure: CYSTOURETHROSCOPY, W/URETERAL CATHETERIZATION, W/WO IRRIG, INSTILL, OR URETEROPYELOG, EXCLUS OF RADIOLG SVC;  Surgeon: Nicholaus Mt Viprakasit, MD;  Location: CYSTO PROCEDURE SUITES [facility]; Service: Urology    PR CYSTOURETHROSCOPY,URETER CATHETER Left 09/04/2018    Procedure: CYSTOURETHROSCOPY, W/URETERAL CATHETERIZATION, W/WO IRRIG, INSTILL, OR URETEROPYELOG, EXCLUS OF RADIOLG SVC;  Surgeon: Nicholaus Mt Viprakasit, MD;  Location: CYSTO PROCEDURE SUITES [facility];  Service: Urology    PR PERQ DILATION XST TRC NEW ACCESS RENAL COLTJ SYS Left 04/24/2022    Procedure: DILATION OF EXISTING TRACT, PERC, FOR ENDOUROLOGIC PROC INCL IMAGING GUIDANCE & RADIOLOGIC SUPERVISION & INTERPRET, W POSTPROC TUBE PLACEMENT, WHEN PERF; INCL NEW ACCESS INTO RENAL COLLECTING SYSTEM;  Surgeon: Mitch Alm Pickles, MD;  [facility] OR [facility];  Service: Urology    PR PERQ NL/PL LITHOTRP COMPLEX >2 CM MLT LOCATIONS Left 04/24/2022    Procedure: standard PCNL prone vs. standard; need fortec;  Surgeon: Mitch Alm Pickles, MD;  Location: [facility] OR [facility];  Service: Urology    PR REMOVE BLADDER STONE,<2.5 CM N/A 08/21/2018    Procedure: LITHOLAPAXY: CRUSH/FRAGMENT CALCULUS, BY ANY MEANS IN BLADDER & REMOVAL OF FRAGMENTS; SIMPLE/SMALL <2.5 CM;  Surgeon: Nicholaus Mt Viprakasit, MD;  Location: CYSTO PROCEDURE SUITES [facility];  Service: Urology    SALPINGOOPHORECTOMY Right     12/07/2010    VAGINAL HYSTERECTOMY      12/07/2010       Social History     Social History     Socioeconomic History    Marital status: Married   Tobacco Use    Smoking status: Never    Smokeless tobacco: Never   Vaping Use    Vaping status: Never Used   Substance and Sexual Activity    Alcohol use: No    Drug use: No    Sexual activity: Not Currently     Social Drivers of Health     Food Insecurity: No Food Insecurity (05/17/2023)    Hunger Vital Sign     Worried About Running Out of Food in the Last Year: Never true     Ran Out of Food in the Last Year: Never true   Tobacco Use: Low Risk (02/12/2024)    Patient History     Smoking Tobacco Use: Never     Smokeless Tobacco Use: Never   Transportation Needs: No Transportation Needs (05/17/2023) PRAPARE - Transportation     Lack of Transportation (Medical): No     Lack of Transportation (Non-Medical): No   Alcohol Use: Not At Risk (08/09/2023)    Alcohol Use     How often do you have a drink containing alcohol?: Never     How often do you have 5 or more drinks on one occasion?: Never   Housing: Low Risk (05/17/2023)    Housing     Within the past 12 months, have you ever stayed: outside, in a car, in a tent, in an overnight shelter, or temporarily  in someone else's home (i.e. couch-surfing)?: No     Are you worried about losing your housing?: No   Utilities: Low Risk (05/17/2023)    Utilities     Within the past 12 months, have you been unable to get utilities (heat, electricity) when it was really needed?: No   Stress: No Stress Concern Present (08/09/2023)    [employer] of Occupational [employer] - Occupational Stress Questionnaire     Feeling of Stress: Not at all   Interpersonal Safety: Not At Risk (02/04/2024)    Interpersonal Safety     Unsafe Where You Currently Live: No     Physically Hurt by Anyone: No     Abused by Anyone: No   Substance Use: Low Risk (08/09/2023)    Substance Use     In the past year, how often have you used prescription drugs for non-medical reasons?: Never     In the past year, how often have you used illegal drugs?: Never     In the past year, have you used any substance for non-medical reasons?: No   Financial Resource Strain: Low Risk (04/25/2022)    Overall Financial Resource Strain (CARDIA)     Difficulty of Paying Living Expenses: Not hard at all   Health Literacy: Low Risk (08/09/2023)    Health Literacy     : Never   Internet Connectivity: No Internet connectivity concern identified (09/08/2022)    Internet Connectivity     Do you have access to internet services: Yes     How do you connect to the internet: Personal Device at home     Is your internet connection strong enough for you to watch video on your device without major problems?: Yes     Do you have enough data to get through the month?: Yes     Does at least one of the devices have a camera that you can use for video chat?: Yes   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  Exposed to 2nd hand smoke; she is a lifelong non-smoker.  Went on disability in Mar 02, 2004 d/t back and has worsened this year.      Enjoys traveling.  Married - husband is widowed.      Family History   Two sons - the youngest died in 03/03/2011 at the age of 74 from bladder cancer.

## 2024-02-18 ENCOUNTER — Ambulatory Visit: Admit: 2024-02-18 | Discharge: 2024-02-18 | Payer: Medicare (Managed Care)

## 2024-02-18 ENCOUNTER — Ambulatory Visit
Admit: 2024-02-18 | Discharge: 2024-02-18 | Payer: Medicare (Managed Care) | Attending: Internal Medicine | Primary: Internal Medicine

## 2024-02-18 ENCOUNTER — Encounter
Admit: 2024-02-18 | Discharge: 2024-02-18 | Payer: Medicare (Managed Care) | Attending: Internal Medicine | Primary: Internal Medicine

## 2024-02-18 ENCOUNTER — Other Ambulatory Visit: Admit: 2024-02-18 | Discharge: 2024-02-18 | Payer: Medicare (Managed Care)

## 2024-02-18 DIAGNOSIS — D7581 Myelofibrosis: Principal | ICD-10-CM

## 2024-02-18 LAB — CBC W/ AUTO DIFF
BASOPHILS ABSOLUTE COUNT: 0 10*9/L (ref 0.0–0.1)
BASOPHILS RELATIVE PERCENT: 0.5 %
EOSINOPHILS ABSOLUTE COUNT: 0.4 10*9/L (ref 0.0–0.5)
EOSINOPHILS RELATIVE PERCENT: 4.4 %
HEMATOCRIT: 33.4 % — ABNORMAL LOW (ref 34.0–44.0)
HEMOGLOBIN: 10.4 g/dL — ABNORMAL LOW (ref 11.3–14.9)
LYMPHOCYTES ABSOLUTE COUNT: 1.9 10*9/L (ref 1.1–3.6)
LYMPHOCYTES RELATIVE PERCENT: 21.9 %
MEAN CORPUSCULAR HEMOGLOBIN CONC: 31.3 g/dL — ABNORMAL LOW (ref 32.0–36.0)
MEAN CORPUSCULAR HEMOGLOBIN: 25.8 pg — ABNORMAL LOW (ref 25.9–32.4)
MEAN CORPUSCULAR VOLUME: 82.4 fL (ref 77.6–95.7)
MEAN PLATELET VOLUME: 11.1 fL — ABNORMAL HIGH (ref 6.8–10.7)
MONOCYTES ABSOLUTE COUNT: 0.2 10*9/L — ABNORMAL LOW (ref 0.3–0.8)
MONOCYTES RELATIVE PERCENT: 2.7 %
NEUTROPHILS ABSOLUTE COUNT: 6.1 10*9/L (ref 1.8–7.8)
NEUTROPHILS RELATIVE PERCENT: 70.5 %
NUCLEATED RED BLOOD CELLS: 13 /100{WBCs} — ABNORMAL HIGH (ref <=4)
PLATELET COUNT: 19 10*9/L — ABNORMAL LOW (ref 150–450)
RED BLOOD CELL COUNT: 4.05 10*12/L (ref 3.95–5.13)
RED CELL DISTRIBUTION WIDTH: 21.1 % — ABNORMAL HIGH (ref 12.2–15.2)
WBC ADJUSTED: 8.6 10*9/L (ref 3.6–11.2)

## 2024-02-18 LAB — FACTOR VIII (VWF PANEL): FACTOR VIII ACTIVITY: 322.4 % — ABNORMAL HIGH (ref 56.0–186.0)

## 2024-02-18 LAB — COMPREHENSIVE METABOLIC PANEL
ALBUMIN: 3.7 g/dL (ref 3.4–5.0)
ALKALINE PHOSPHATASE: 74 U/L (ref 46–116)
ALT (SGPT): <7 U/L — ABNORMAL LOW (ref 10–49)
ANION GAP: 9 mmol/L (ref 5–14)
AST (SGOT): 11 U/L (ref <=34)
BILIRUBIN TOTAL: 1.1 mg/dL (ref 0.3–1.2)
BLOOD UREA NITROGEN: 18 mg/dL (ref 9–23)
BUN / CREAT RATIO: 9
CALCIUM: 9.4 mg/dL (ref 8.7–10.4)
CHLORIDE: 100 mmol/L (ref 98–107)
CO2: 33 mmol/L — ABNORMAL HIGH (ref 20.0–31.0)
CREATININE: 1.91 mg/dL — ABNORMAL HIGH (ref 0.55–1.02)
EGFR CKD-EPI (2021) FEMALE: 27 mL/min/1.73m2 — ABNORMAL LOW (ref >=60)
GLUCOSE RANDOM: 113 mg/dL (ref 70–179)
POTASSIUM: 3.6 mmol/L (ref 3.4–4.8)
PROTEIN TOTAL: 7.7 g/dL (ref 5.7–8.2)
SODIUM: 142 mmol/L (ref 135–145)

## 2024-02-18 LAB — URINALYSIS WITH MICROSCOPY
BILIRUBIN UA: NEGATIVE
GLUCOSE UA: NEGATIVE
HYALINE CASTS: 16 /LPF — ABNORMAL HIGH (ref 0–1)
KETONES UA: NEGATIVE
NITRITE UA: NEGATIVE
PH UA: 5.5 (ref 5.0–9.0)
PROTEIN UA: 100 — AB
RBC UA: >182 /HPF — ABNORMAL HIGH (ref <=4)
SPECIFIC GRAVITY UA: 1.02 (ref 1.003–1.030)
SQUAMOUS EPITHELIAL: 9 /HPF — ABNORMAL HIGH (ref 0–5)
UROBILINOGEN UA: <2
WBC UA: 12 /HPF — ABNORMAL HIGH (ref 0–5)

## 2024-02-18 LAB — THROMBIN TIME: THROMBIN TIME: 19.7 s (ref 15.8–24.9)

## 2024-02-18 LAB — PROTIME-INR
INR: 1.3
PROTIME: 14.7 s — ABNORMAL HIGH (ref 9.9–12.6)

## 2024-02-18 LAB — APTT
APTT: 25.7 s (ref 24.8–38.4)
HEPARIN CORRELATION: <0.2

## 2024-02-18 LAB — SLIDE REVIEW

## 2024-02-18 LAB — LACTATE DEHYDROGENASE: LACTATE DEHYDROGENASE: 404 U/L — ABNORMAL HIGH (ref 120–246)

## 2024-02-18 LAB — FIBRINOGEN: FIBRINOGEN LEVEL: 501 mg/dL — ABNORMAL HIGH (ref 175–500)

## 2024-02-18 NOTE — Telephone Encounter (Signed)
 Patient returned call and confirmed infusion appt for 1/16

## 2024-02-19 DIAGNOSIS — D7581 Myelofibrosis: Principal | ICD-10-CM

## 2024-02-20 DIAGNOSIS — D7581 Myelofibrosis: Principal | ICD-10-CM

## 2024-02-20 MED ORDER — OXYCODONE-ACETAMINOPHEN 10 MG-325 MG TABLET
ORAL_TABLET | ORAL | 0 refills | 0.00000 days | Status: CP
Start: 2024-02-20 — End: ?

## 2024-02-20 NOTE — Telephone Encounter (Signed)
 Copied from CRM #[id number]. Topic: Care Management - Discuss Care Plan  >> Feb 20, 2024  2:04 PM Jon RAMAN wrote:      [name] case manager contacted the Communication Center requesting to speak with the care team of Emily Walter to discuss:    Called to let Dr Jerrye know that patient were admitted in [facility] on 02/04/24.    Please contact Millman  at [phone].    Thank you,   Jon JONELLE Shoulder  [facility]   [phone]

## 2024-02-20 NOTE — Telephone Encounter (Signed)
 The PAC has received an incoming call regarding a paperwork request: Patient     Type/name of paperwork, and request:   Patient is requesting a Pain referral to see a Pain doctor.    Who should paperwork be sent back to:     [facility] Internal medicine     Fax/email/address:     Best callback number if any questions:    [phone]      Caller's Name:  Cheryl

## 2024-02-20 NOTE — Telephone Encounter (Signed)
 Spoke with patient. Notified her that referral was placed on 02-11-24 to [facility] health pain clinic in [place]. Advised patient to call [phone] to make an appointment. Patient verbalized understanding

## 2024-02-21 LAB — VON WILLEBRAND FACTOR PANEL
VON WILLEBRAND AG: 303 %{normal} — ABNORMAL HIGH (ref 50–200)
VON WILLEBRAND FACTOR ACTIVITY: 269 %{normal} — ABNORMAL HIGH (ref 50–200)

## 2024-02-22 ENCOUNTER — Ambulatory Visit: Admit: 2024-02-22 | Discharge: 2024-02-22 | Payer: Medicare (Managed Care)

## 2024-02-22 ENCOUNTER — Encounter: Admit: 2024-02-22 | Discharge: 2024-02-22 | Payer: Medicare (Managed Care)

## 2024-02-22 ENCOUNTER — Encounter
Admit: 2024-02-22 | Discharge: 2024-02-22 | Payer: Medicare (Managed Care) | Attending: Adult Health | Primary: Adult Health

## 2024-02-22 DIAGNOSIS — D7581 Myelofibrosis: Principal | ICD-10-CM

## 2024-02-22 DIAGNOSIS — D696 Thrombocytopenia, unspecified: Principal | ICD-10-CM

## 2024-02-22 LAB — COMPREHENSIVE METABOLIC PANEL
ALBUMIN: 3.8 g/dL (ref 3.4–5.0)
ALKALINE PHOSPHATASE: 68 U/L (ref 46–116)
ALT (SGPT): <7 U/L — ABNORMAL LOW (ref 10–49)
ANION GAP: 14 mmol/L (ref 5–14)
AST (SGOT): 22 U/L (ref <=34)
BILIRUBIN TOTAL: 1 mg/dL (ref 0.3–1.2)
CALCIUM: 9.2 mg/dL (ref 8.7–10.4)
CHLORIDE: 99 mmol/L (ref 98–107)
CO2: 30.2 mmol/L (ref 20.0–31.0)
GLUCOSE RANDOM: 118 mg/dL (ref 70–179)
PROTEIN TOTAL: 8.1 g/dL (ref 5.7–8.2)
SODIUM: 143 mmol/L (ref 135–145)

## 2024-02-22 LAB — CBC W/ AUTO DIFF
BASOPHILS ABSOLUTE COUNT: 0.2 10*9/L — ABNORMAL HIGH (ref 0.0–0.1)
BASOPHILS RELATIVE PERCENT: 1.8 %
EOSINOPHILS ABSOLUTE COUNT: 0.6 10*9/L — ABNORMAL HIGH (ref 0.0–0.5)
EOSINOPHILS RELATIVE PERCENT: 5.7 %
HEMATOCRIT: 33 % — ABNORMAL LOW (ref 34.0–44.0)
HEMOGLOBIN: 10.6 g/dL — ABNORMAL LOW (ref 11.3–14.9)
LYMPHOCYTES ABSOLUTE COUNT: 2.1 10*9/L (ref 1.1–3.6)
LYMPHOCYTES RELATIVE PERCENT: 20.1 %
MEAN CORPUSCULAR HEMOGLOBIN CONC: 32 g/dL (ref 32.0–36.0)
MEAN CORPUSCULAR HEMOGLOBIN: 26.4 pg (ref 25.9–32.4)
MEAN CORPUSCULAR VOLUME: 82.5 fL (ref 77.6–95.7)
MEAN PLATELET VOLUME: 6.4 fL — ABNORMAL LOW (ref 6.8–10.7)
MONOCYTES ABSOLUTE COUNT: 0.3 10*9/L (ref 0.3–0.8)
MONOCYTES RELATIVE PERCENT: 2.9 %
NEUTROPHILS ABSOLUTE COUNT: 7.3 10*9/L (ref 1.8–7.8)
NEUTROPHILS RELATIVE PERCENT: 69.5 %
NUCLEATED RED BLOOD CELLS: 2 /100{WBCs} (ref <=4)
PLATELET COUNT: 13 10*9/L — ABNORMAL LOW (ref 150–450)
RED BLOOD CELL COUNT: 4.01 10*12/L (ref 3.95–5.13)
RED CELL DISTRIBUTION WIDTH: 21.3 % — ABNORMAL HIGH (ref 12.2–15.2)
WBC ADJUSTED: 10.5 10*9/L (ref 3.6–11.2)

## 2024-02-22 LAB — PLATELET COUNT: PLATELET COUNT: 17 10*9/L — ABNORMAL LOW (ref 150–450)

## 2024-02-22 NOTE — Telephone Encounter (Signed)
 Can you please ask her if she just wants to come to our pain clinic at [facility]? Thanks

## 2024-02-22 NOTE — Progress Notes (Signed)
 Access:  PIV    Labs: Platelets 13k, Hgb 10.6    Patient reported new or worsening symptoms (if yes, explanation needed): No     Provider Notified: N/A    Ok to Treat given: N/A    Dignicap: N/A    Treatment Administered : Platelet transfusion. Following 1st unit, plt count drawn from PIV. Platelets 17k. Second unit of platelets given.    Patient Education Performed (if yes,explanation needed): Yes, platelet transfusion    Patient/Patient's Representative verbalized understanding of the education provided, with no additional questions or concerns at this time. :Yes    Time Spent on Patient Education: 5 min    Pre-Chemotherapy Blood Return : Yes    Hypersensitivity Reaction (if yes, explanation needed): No    Post Chemotherapy Blood Return: Yes    Heparin Locked: N/A    Dressing: gauze/coban    Stable Discharge (if no explanation needed): Yes    Patient's husband notified that PICC placement  has been ordered and requested for the coming week, and to expect to hear from VIR soon regarding an appointment for this.     Patient was instructed to stop by the check out desk to complete today's visit and to receive a printed after visit summary then discharged home to self care.

## 2024-02-22 NOTE — Telephone Encounter (Signed)
 TC attempted to pt. To schedule PICC insertion- NA- LVM return call.

## 2024-02-22 NOTE — Telephone Encounter (Signed)
 She states that she is willing to come to the pain clinic here at [facility]

## 2024-02-22 NOTE — Telephone Encounter (Signed)
 TC to schedule PICC placement. Date time, location and insurance verified- scheduled with spouse. Per [employer] message with [facility] pt. Will get a platelet infusion prior to PICC line placementPD)

## 2024-02-22 NOTE — Telephone Encounter (Signed)
 Spouse called and states that the  pain clinic that she was referred to does not do oral pain med control so she needs referral to another pain clinic.

## 2024-02-22 NOTE — Patient Instructions (Signed)
 Infusion on 02/22/2024   Component Date Value Ref Range Status    PRODUCT CODE 02/22/2024 [id number]   Final    Product ID 02/22/2024 Platelets   Final    Specimen Expiration Date 02/22/2024 [id number]   Final    Status 02/22/2024 Transfused   Final    Unit # 02/22/2024 [id number]   Final    Unit Blood Type 02/22/2024 B Pos   Final    ISBT Number 02/22/2024 [id number]   Final    Platelet 02/22/2024 17 (L)  150 - 450 10*9/L Final    PRODUCT CODE 02/22/2024 [id number]   Final    Product ID 02/22/2024 Platelets   Final    Specimen Expiration Date 02/22/2024 [id number]   Final    Status 02/22/2024 Transfused   Final    Unit # 02/22/2024 [id number]   Final    Unit Blood Type 02/22/2024 A Pos   Final    ISBT Number 02/22/2024 [id number]   Final   Clinical Support on 02/22/2024   Component Date Value Ref Range Status    Sodium 02/22/2024 143  135 - 145 mmol/L Final    Potassium 02/22/2024    Final    Hemolyzed.     Chloride 02/22/2024 99  98 - 107 mmol/L Final    CO2 02/22/2024 30.2  20.0 - 31.0 mmol/L Final    Anion Gap 02/22/2024 14  5 - 14 mmol/L Final    BUN 02/22/2024    Final    Hemolyzed.     Creatinine 02/22/2024    Final    Hemolyzed.     eGFR CKD-EPI (2021) Female 02/22/2024    Final    eGFR calculated with CKD-EPI 2021 equation in accordance with [employer] and [employer] of Nephrology Task Force recommendations.    Glucose 02/22/2024 118  70 - 179 mg/dL Final    Calcium 98/83/7973 9.2  8.7 - 10.4 mg/dL Final    Albumin 98/83/7973 3.8  3.4 - 5.0 g/dL Final    Total Protein 02/22/2024 8.1  5.7 - 8.2 g/dL Final    Total Bilirubin 02/22/2024 1.0  0.3 - 1.2 mg/dL Final    AST 98/83/7973 22  <=34 U/L Final    ALT 02/22/2024 <7 (L)  10 - 49 U/L Final    Alkaline Phosphatase 02/22/2024 68  46 - 116 U/L Final    Blood Type 02/22/2024 B POS   Final    Antibody Screen 02/22/2024 NEG   Final    WBC 02/22/2024 10.5  3.6 - 11.2 10*9/L Final    RBC 02/22/2024 4.01  3.95 - 5.13 10*12/L Final    HGB 02/22/2024 10.6 (L)  11.3 - 14.9 g/dL Final    HCT 98/83/7973 33.0 (L)  34.0 - 44.0 % Final    MCV 02/22/2024 82.5  77.6 - 95.7 fL Final    MCH 02/22/2024 26.4  25.9 - 32.4 pg Final    MCHC 02/22/2024 32.0  32.0 - 36.0 g/dL Final    RDW 98/83/7973 21.3 (H)  12.2 - 15.2 % Final    MPV 02/22/2024 6.4 (L)  6.8 - 10.7 fL Final    Platelet 02/22/2024 13 (L)  150 - 450 10*9/L Final    nRBC 02/22/2024 2  <=4 /100 WBCs Final    Neutrophils % 02/22/2024 69.5  % Final    Lymphocytes % 02/22/2024 20.1  % Final    Monocytes % 02/22/2024 2.9  % Final    Eosinophils % 02/22/2024 5.7  %  Final    Basophils % 02/22/2024 1.8  % Final    Absolute Neutrophils 02/22/2024 7.3  1.8 - 7.8 10*9/L Final    Absolute Lymphocytes 02/22/2024 2.1  1.1 - 3.6 10*9/L Final    Absolute Monocytes 02/22/2024 0.3  0.3 - 0.8 10*9/L Final    Absolute Eosinophils 02/22/2024 0.6 (H)  0.0 - 0.5 10*9/L Final    Absolute Basophils 02/22/2024 0.2 (H)  0.0 - 0.1 10*9/L Final    Anisocytosis 02/22/2024 Marked (A)  Not Present Final

## 2024-02-23 DIAGNOSIS — D7581 Myelofibrosis: Principal | ICD-10-CM

## 2024-02-24 DIAGNOSIS — D7581 Myelofibrosis: Principal | ICD-10-CM

## 2024-02-26 NOTE — Telephone Encounter (Signed)
 VIR pre PICC insert prep call completed. Reviewed to register at 1000 through main entrance at [facility] then proceed to VIR on 2nd floor.   All questions answered.     Has plt transfusion prior.

## 2024-02-26 NOTE — Telephone Encounter (Signed)
 VIR pre call attempt.  LM for call back.

## 2024-02-27 NOTE — Telephone Encounter (Signed)
 Pt notified of referral to [facility] pain clinic

## 2024-02-28 ENCOUNTER — Inpatient Hospital Stay: Admit: 2024-02-28 | Discharge: 2024-02-28 | Payer: Medicare (Managed Care)

## 2024-02-28 ENCOUNTER — Encounter: Admit: 2024-02-28 | Discharge: 2024-02-28 | Payer: Medicare (Managed Care)

## 2024-02-28 DIAGNOSIS — D696 Thrombocytopenia, unspecified: Principal | ICD-10-CM

## 2024-02-28 DIAGNOSIS — D7581 Myelofibrosis: Principal | ICD-10-CM

## 2024-02-28 LAB — CBC
HEMATOCRIT: 29.4 % — ABNORMAL LOW (ref 34.0–44.0)
HEMOGLOBIN: 9.8 g/dL — ABNORMAL LOW (ref 11.3–14.9)
MEAN CORPUSCULAR HEMOGLOBIN CONC: 33.2 g/dL (ref 32.0–36.0)
MEAN CORPUSCULAR HEMOGLOBIN: 27.2 pg (ref 25.9–32.4)
MEAN CORPUSCULAR VOLUME: 82 fL (ref 77.6–95.7)
MEAN PLATELET VOLUME: 9.9 fL (ref 6.8–10.7)
PLATELET COUNT: 19 10*9/L — ABNORMAL LOW (ref 150–450)
RED BLOOD CELL COUNT: 3.59 10*12/L — ABNORMAL LOW (ref 3.95–5.13)
RED CELL DISTRIBUTION WIDTH: 21.5 % — ABNORMAL HIGH (ref 12.2–15.2)
WBC ADJUSTED: 8.6 10*9/L (ref 3.6–11.2)

## 2024-02-28 LAB — PLATELET COUNT: PLATELET COUNT: 19 10*9/L — ABNORMAL LOW (ref 150–450)

## 2024-02-28 NOTE — Progress Notes (Signed)
 Patient arrived in clinic in stable, ambulatory condition. She is feeling okay for transfusion today. Plan is to transfuse platelets ahead of 1100 PICC line insertion. VIR aware of plan.     Access:  PIV - placed by Chad with VIR via ultrasound    Labs: Within parameters - platelets 19, indication for transfusion    Treatment Administered : 1u platelets    Hypersensitivity Reaction (if yes, explanation needed): No    Dressing: PIV flushed and capped, dressing C/D/I, left accessed for VIR appt    Stable Discharge (if no explanation needed): Yes    Patient was instructed to stop by the check out desk to complete today's visit and directed to the main hospital to check in for VIR.

## 2024-02-28 NOTE — Patient Instructions (Addendum)
 Infusion on 02/28/2024   Component Date Value Ref Range Status    WBC 02/28/2024 8.6  3.6 - 11.2 10*9/L Final    RBC 02/28/2024 3.59 (L)  3.95 - 5.13 10*12/L Final    HGB 02/28/2024 9.8 (L)  11.3 - 14.9 g/dL Final    HCT 98/77/7973 29.4 (L)  34.0 - 44.0 % Final    MCV 02/28/2024 82.0  77.6 - 95.7 fL Final    MCH 02/28/2024 27.2  25.9 - 32.4 pg Final    MCHC 02/28/2024 33.2  32.0 - 36.0 g/dL Final    RDW 98/77/7973 21.5 (H)  12.2 - 15.2 % Final    MPV 02/28/2024 9.9  6.8 - 10.7 fL Final    Platelet 02/28/2024 19 (L)  150 - 450 10*9/L Final    PRODUCT CODE 02/28/2024 [id number]   Final    Product ID 02/28/2024 Platelets   Final    Status 02/28/2024 Transfused   Final    Unit # 02/28/2024 [id number]   Final    Unit Blood Type 02/28/2024 O Pos   Final    ISBT Number 02/28/2024 [id number]   Final    Platelet 02/28/2024 19 (L)  150 - 450 10*9/L Final

## 2024-02-28 NOTE — Procedures (Signed)
 PICC LINE INSERTION PROCEDURE NOTE    Indications:  Poor Vascular Access and Long Term Therapy    Consent/Time Out:    Risks, benefits and alternatives discussed with patient.  Written Consent was obtained prior to the procedure and is detailed in the medical record.  Prior to the start of the procedure, a time out was taken and the identity of the patient was confirmed via name, medical record number and  date of birth.  The availability of the correct equipment was verified.    Procedure Details:  The vein was identified and measured for appropriate catheter length. Maximum sterile techniques including PPE were utilized per protocol.  Sterile field prepared with necessary supplies and equipment. Insertion site was prepped with chlorhexidine solution and allowed to dry. Lidocaine  2 mL subcutaneously and intradermally administered to insertion site.  The catheter was primed with normal saline.  A 4 FR Single lumen was inserted to the L Basilic vein with 1 insertion attempt(s).  Catheter aspirated, 3 mL blood return present.  The catheter was then flushed with 10 mL of normal saline.   Insertion site cleansed, and sterile dressing applied per manufacturer guidelines. The Central Line Checklist was referenced.  The catheter was inserted without difficulty by Chad Jaicee Michelotti, BS, BSN, VA-BC.    Findings:  Manufacturer:  Bard  Lot #:  [id number]  CT Injectable (power):  Yes  Arm Circumference:  35  cm  Total catheter length:  44 cm.    External length:  0 cm.  Catheter trimmed:  yes  Port reserved:  No    Vein Size:   Left arm basilic vein compressible:  Yes, measurement:   (see image)    Tip Placement Verification:   [employer] and Sherlock    Workup Time:    45 minutes    Recommendations:  PICC brochure given to patient with teaching instructions  Refer to head of bed sign      See images below:

## 2024-02-29 DIAGNOSIS — H903 Sensorineural hearing loss, bilateral: Secondary | ICD-10-CM

## 2024-02-29 DIAGNOSIS — H748X3 Other specified disorders of middle ear and mastoid, bilateral: Secondary | ICD-10-CM

## 2024-02-29 DIAGNOSIS — H7111 Cholesteatoma of tympanum, right ear: Secondary | ICD-10-CM

## 2024-02-29 DIAGNOSIS — H9122 Sudden idiopathic hearing loss, left ear: Principal | ICD-10-CM

## 2024-03-03 DIAGNOSIS — D7581 Myelofibrosis: Principal | ICD-10-CM

## 2024-03-03 NOTE — Progress Notes (Signed)
 03/03/2024 [facility] Refill Coordination Note    Specialty Medication(s) to be Shipped:   Hematology/Oncology: OJJAARA  100 mg tablet (momelotinib)    Other medication(s) to be shipped: No additional medications requested for fill at this time    Specialty Medications not needed at this time: N/A     Emily Walter, DOB: 1950/05/08  Phone: [phone] (home)       All above HIPAA information was verified with patient.     Was a nurse, learning disability used for this call? No    Completed refill call assessment today to schedule patient's medication shipment from the [facility]  [phone]).  All relevant notes have been reviewed.     Specialty medication(s) and dose(s) confirmed: Regimen is correct and unchanged.   Changes to medications: [name] reports no changes at this time.  Changes to insurance: No  New side effects reported not previously addressed with a pharmacist or physician: None reported  Questions for the pharmacist: No    Confirmed patient received a Conservation Officer, Historic Buildings and a Surveyor, Mining with first shipment. The patient will receive a drug information handout for each medication shipped and additional FDA Medication Guides as required.       DISEASE/MEDICATION-SPECIFIC INFORMATION        N/A    SPECIALTY MEDICATION ADHERENCE     Medication Adherence    Patient reported X missed doses in the last month: 0  Specialty Medication: OJJAARA  100 mg tablet (momelotinib)  Patient is on additional specialty medications: No              Were doses missed due to medication being on hold? No    OJJAARA  100 mg tablet (momelotinib): 14 doses of medicine on hand     Specialty medication is an injection or given on a cycle: No    REFERRAL TO PHARMACIST     Referral to the pharmacist: Not needed      [facility]     Shipping address confirmed in Epic.     Cost and Payment: Unable to determine copay at this time as the prescription requires a prior authorization/financial assistance. Patient is aware that shipment will be held until copay has been approved and payment information collected, if needed.    Delivery Scheduled: Yes, Expected medication delivery date: 03/14/24.     Medication will be delivered via UPS to the prescription address in Epic WAM.    Esai Stecklein   [facility]  Specialty Technician

## 2024-03-03 NOTE — Progress Notes (Signed)
 RETURN VISIT AND EXAM    Assessment/Plan:       Tympanostomy tube dysfunction, right ear    She has right tympanostomy tube dysfunction with dried blood and partial obstruction.  The right myringotomy and tube was performed at the last office visit on 02/12/2024.  No active bleeding is present.  However the right right tube is nonfunctional.  The plan was to attempt to reopen the Armstrong tube with in office suction.  Risks include bleeding, infection, and discomfort, with anticipated restoration of tube function and resolution of obstruction.  Due to patient discomfort, re- cannulation of the right tube is not possible  - Instructed her to continue prescribed( 2 types) ear drops in the right ear at home.  - Scheduled follow-up for Friday, February 29, 2024, to reassess right tympanostomy tube function.  - Discussed tube replacement at follow-up if dysfunction persists, with both small and large tubes available.  - Advised against home use of peroxide due to potential irritation.            Subjective:      History of Present Illness    Emily Walter is a 74 year old female with bilateral tympanostomy tubes who presents for evaluation of tube status in view of bleeding and low platelet count    Right Ear Tympanostomy Tube Dysfunction:  -Dysfunction of the right tympanostomy tube  - Ongoing dried blood and possible tube obstruction  - Prescribed ear drops ciprofloxacin  and dexamethasone ophthalmic drops to be used as directed and return on to potentially replace the right tube  - Peroxide irrigation and suction performed  in office did not remove clot in right tube lumen  - Tube remains obstructed  - No otalgia  - No recent active otorrhagia    Left Ear Symptoms:  - Intermittent dried blood and occasional clots, cleared every other day prior to drop administration  - No pain in the left ear  - Cotton used in the left ear only when blood is present    Neck and Bilateral Shoulder Pain:  - Significant, severe pain in the neck and both shoulders  - Pain exacerbated by positioning for ear drop administration and head movement  - Heating pad and topical analgesics provide only temporary relief  - No otalgia  - No history of neck trauma  - No physical therapy or TENS unit use    Thrombocytopenia and Related Symptoms:  - Currently receiving platelet transfusions for thrombocytopenia  - Most recent transfusion on February 22, 2024  - Scheduled for PICC line placement on February 28, 2024  - Additional platelet transfusion planned if needed prior to procedure  - Occasional dizziness       - Skin lesion left malar region- consider referral to dermatology at a later date, when platelet count  stable      Objective:     Past Medical History:  Past Medical History[1]    Past Surgical History:  Past Surgical History[2]    Medications:  @[facility] @    Allergies:  Allergies[3]    Family History:  Family History[4]    Social History:  Short Social History[5]    Review of Systems:  A full review of systems is documented and negative except as noted in HPI. Patient intake form reviewed.       Physical Exam:   VITAL SIGNS: There were no vitals filed for this visit.     General: In apparent distress with movement that involves being seated in  the exam chair and also reclining to use microscope.  Head/Face: On external examination there is small left malar crusted lesion which may be a basal cell lesion less than 8 mm. On palpation there is no tenderness over maxillary sinuses or masses within the salivary glands. Cranial nerves V and VII are intact through all distributions.  Eyes: PERRL, EOMI, the conjunctiva are not injected and sclera is non-icteric.  Ears:   Right ear: On external exam, there is no obvious lesions or asymmetry. Dried blood in canal . No lesions in the EAC. Non functional right tube with obstructed lumen  Left ear: On external exam, there is no obvious lesions or asymmetry. Residual small amount of dried blood in canal non obstructing. No lesions in the EAC. Functional myringotomy tube  Hearing is diminished  Nose: Narrow nasal airway with deviated septum to the left  Oral cavity/oropharynx: The mucosa of the lips, gums, hard and soft palate, posterior pharyngeal wall, tongue, floor of mouth, and buccal region are without masses or lesions and are normally hydrated.  No evidence of recurrence of intraoral yeast infection.    Microscope exam and attempted debridement, right tympanostomy tube.    Microscope used to inspect tympanostomy tube. Dried blood clot obstructing tube opening removed. Hydrogen peroxide instilled into ear canal, followed by suctioning to clear debris. Tube remains obstructed; no active bleeding observed. Plan to replace tube if obstruction persists.    Microscope exam and cleaning left ear  Microscope used to inspect tympanostomy tube. Gentle removal of dried blood from tube opening and cleaning performed. Tube patent, no active bleeding present. No further intervention required for left ear.   Ofloxacin drops placed in each ear canal.         [1]   Past Medical History:  Diagnosis Date    Anxiety     Arthritis     Asthma (HHS-HCC)     Cancer    (CMS-HCC)     Cataract     Chronic kidney disease     Depression     Difficult intravenous access     GERD (gastroesophageal reflux disease)     Heart failure (CMS-HCC)     HLD (hyperlipidemia)     Hypertension     Myelofibrosis    (CMS-HCC)     Nephrolithiasis     Sleep apnea     Tinnitus    [2]   Past Surgical History:  Procedure Laterality Date    HYSTERECTOMY      PR CYSTO/URETERO W/LITHOTRIPSY &INDWELL STENT INSRT Left 08/21/2018    Procedure: priority CYSTOURETHROSCOPY, WITH URETEROSCOPY AND/OR PYELOSCOPY; WITH LITHOTRIPSY INCLUDING INSERTION OF INDWELLING URETERAL STENT;  Surgeon: Nicholaus Mt Viprakasit, MD;  Location: CYSTO PROCEDURE SUITES [facility];  Service: Urology    PR CYSTO/URETERO/PYELOSCOPY, CALCULUS TX Left 09/04/2018    Procedure: CYSTOURETHROSCOPY, W/URETEROSCOPY &/OR PYELOSCOPY; W/REMOVAL/MANIPULATION CALCULUS(URETERAL CATH INCLUDED);  Surgeon: Nicholaus Mt Viprakasit, MD;  Location: CYSTO PROCEDURE SUITES [facility];  Service: Urology    PR CYSTOSCOPY,INSERT URETERAL STENT Left 05/04/2022    Procedure: CYSTOURETHROSCOPY,  WITH INSERTION OF INDWELLING URETERAL STENT (EG, GIBBONS OR DOUBLE-J TYPE);  Surgeon: Drena Darryle Gull, MD;  Location: MAIN OR [facility];  Service: Urology    PR CYSTOSCOPY,REMV CALCULUS,SIMPLE Left 09/04/2018    Procedure: CYSTOURETHROSCOPY, WITH REMOVAL OF FOREIGN BODY, CALCULUS OR URETERAL STENT FROM URETHRA OR BLADDER; SIMPLE;  Surgeon: Nicholaus Mt Viprakasit, MD;  Location: CYSTO PROCEDURE SUITES [facility];  Service: Urology    PR CYSTOURETHROSCOPY,URETER CATHETER Left 08/21/2018  Procedure: CYSTOURETHROSCOPY, W/URETERAL CATHETERIZATION, W/WO IRRIG, INSTILL, OR URETEROPYELOG, EXCLUS OF RADIOLG SVC;  Surgeon: Nicholaus Mt Viprakasit, MD;  Location: CYSTO PROCEDURE SUITES [facility];  Service: Urology    PR CYSTOURETHROSCOPY,URETER CATHETER Left 09/04/2018    Procedure: CYSTOURETHROSCOPY, W/URETERAL CATHETERIZATION, W/WO IRRIG, INSTILL, OR URETEROPYELOG, EXCLUS OF RADIOLG SVC;  Surgeon: Nicholaus Mt Viprakasit, MD;  Location: CYSTO PROCEDURE SUITES [place];  Service: Urology    PR PERQ DILATION XST TRC NEW ACCESS RENAL COLTJ SYS Left 04/24/2022    Procedure: DILATION OF EXISTING TRACT, PERC, FOR ENDOUROLOGIC PROC INCL IMAGING GUIDANCE & RADIOLOGIC SUPERVISION & INTERPRET, W POSTPROC TUBE PLACEMENT, WHEN PERF; INCL NEW ACCESS INTO RENAL COLLECTING SYSTEM;  Surgeon: Mitch Alm Pickles, MD;  [facility]: [facility] OR [facility];  Service: Urology    PR PERQ NL/PL LITHOTRP COMPLEX >2 CM MLT LOCATIONS Left 04/24/2022    Procedure: standard PCNL prone vs. standard; need fortec;  Surgeon: Mitch Alm Pickles, MD;  [facility];  Service: Urology    PR REMOVE BLADDER STONE,<2.5 CM N/A 08/21/2018    Procedure: LITHOLAPAXY: CRUSH/FRAGMENT CALCULUS, BY ANY MEANS IN BLADDER & REMOVAL OF FRAGMENTS; SIMPLE/SMALL <2.5 CM;  Surgeon: Nicholaus Mt Viprakasit, MD;  Location: CYSTO PROCEDURE SUITES [facility];  Service: Urology    SALPINGOOPHORECTOMY Right     12/07/2010    VAGINAL HYSTERECTOMY      12/07/2010   [3]   Allergies  Allergen Reactions    Fentanyl Anaphylaxis    Codeine Other (See Comments)     Headache, nausea    Corticosteroids (Glucocorticoids) Headache     **ALL STEROIDS**    Gabapentin Other (See Comments)     oversedation    Other Reaction(s): Other (see comments)    oversedation      Other reaction(s): Other (See Comments)   oversedation   oversedation   oversedation    Prednisone  Headache    Pregabalin Rash     Swelling and general fatigue    Other Reaction(s): Other (see comments)    Swelling and general fatigue   Other reaction(s): Other (see comments)   Swelling and general fatigue   Swelling and general fatigue   [4]   Family History  Problem Relation Age of Onset    Hypertension Mother     Cancer Brother         bone    Hypertension Brother     Hypertension Brother     Hypertension Brother     Anesthesia problems Neg Hx     Bleeding Disorder Neg Hx     Breast cancer Neg Hx     Ovarian cancer Neg Hx     Colon cancer Neg Hx     Endometrial cancer Neg Hx    [5]   Social History  Tobacco Use    Smoking status: Never    Smokeless tobacco: Never   Vaping Use    Vaping status: Never Used   Substance Use Topics    Alcohol use: No    Drug use: No

## 2024-03-06 ENCOUNTER — Ambulatory Visit: Admit: 2024-03-06 | Discharge: 2024-03-07

## 2024-03-06 DIAGNOSIS — E66812 Class 2 obesity with body mass index (BMI) of 37.0 to 37.9 in adult, unspecified obesity type, unspecified whether serious comorbidity present: Secondary | ICD-10-CM

## 2024-03-06 DIAGNOSIS — M5416 Radiculopathy, lumbar region: Secondary | ICD-10-CM

## 2024-03-06 DIAGNOSIS — M47816 Spondylosis without myelopathy or radiculopathy, lumbar region: Secondary | ICD-10-CM

## 2024-03-06 DIAGNOSIS — F1190 Opioid use, unspecified, uncomplicated: Secondary | ICD-10-CM

## 2024-03-06 DIAGNOSIS — N1831 Stage 3a chronic kidney disease (CMS-HCC): Secondary | ICD-10-CM

## 2024-03-06 DIAGNOSIS — Z5181 Encounter for therapeutic drug level monitoring: Principal | ICD-10-CM

## 2024-03-06 DIAGNOSIS — Z6837 Body mass index (BMI) 37.0-37.9, adult: Secondary | ICD-10-CM

## 2024-03-06 DIAGNOSIS — Z79891 Long term (current) use of opiate analgesic: Secondary | ICD-10-CM

## 2024-03-06 NOTE — Progress Notes (Signed)
 Patient Name: Emily Walter  Date of Birth: [birth year]  Primary Care Physician: Marnie Emmie Dais, MD       Lahna was seen today for establish care.    Diagnoses and all orders for this visit:    Lumbar spondylosis    Lumbar radiculopathy    Chronic, continuous use of opioids    Stage 3a chronic kidney disease (CMS-HCC)    Class 2 obesity with body mass index (BMI) of 37.0 to 37.9 in adult, unspecified obesity type, unspecified whether serious comorbidity present    Assessment & Plan  Chronic low back pain with right-sided lumbar radiculopathy  Chronic low back pain with intermittent right-sided lumbar radiculopathy, exacerbated by prolonged sitting, standing, and riding. Pain occasionally radiates down the back of the right leg, stopping at the knee, with severity reaching 10/10. Current management includes oxycodone , lidocaine  patches, and Voltaren  gel. Not a candidate at this time for injections due to thrombocytopenia. Physical therapy has not been pursued recently.  - Continue oxycodone , lidocaine  patches, and Voltaren  gel for pain management.     Primary myelofibrosis with secondary thrombocytopenia  Primary myelofibrosis with secondary thrombocytopenia. Under care at a cancer center with plans for infusions to improve blood counts. Ongoing care coordination between cancer center and primary care provider.     Chronic, continued use of opioids. Discussed with the patient in details risks of continued opioid intake including opioid hyperalgesia, and opioid-induced adrenal insufficiency with low cortisol levels.   - Was previous rx'd Oxycodone  10/325mg  3-4 times daily from [facility] at [facility]. Patient reports only taking medications 2-3 times daily. States she takes at least once per day but sometimes needs to take 2-3 times daily for pain. Will decrease rx quantity prescribed and directions to reflect. Fill date of 02/20/2024. I have recommended patient to find a pain management provider closer to her home since she has moved.   - Oxycodone  10/325mg  #75 sent in by Camelia Curl, NP-C  - (UDS from 12/17/2023 appropriate).  - UDS collected 03/06/2024    Stage 3a CKD- No NSAIDS.     Obesity- will benefit from diet, exercise and weight loss; activity currently limited by pain. BMI 34.06          No follow-ups on file.     I have reviewed past medical, surgical, medication, allergy, social, and family histories today and updated them in Epic where appropriate.    Subjective:     Chief Compaint: Pain    HPI:   History of Present Illness  Emily Walter is a 74 year old female with myelofibrosis who presents with back pain and leg pain.    She experiences lower back pain that sometimes radiates down her right leg, stopping at the knee. The pain is severe, reaching a 9 out of 10 at its worst, and is exacerbated by prolonged sitting, standing, and riding in a vehicle. Currently, there is no back pain radiating down the legs, but past occurrences are noted.    She has a history of myelofibrosis and is under the care of a cancer center. She has previously required platelet and whole blood transfusions. She uses oxycodone  for pain management, originally prescribed for her back pain, along with lidocaine  patches and Voltaren  gel. She has allergies to gabapentin, Lyrica, codeine, steroids, prednisone , and fentanyl.    She has experienced two recent falls, one resulting in a concussion, and reports dizziness and hearing loss in one ear. She uses a cane and has two  walkers. No smoking, diabetes, or use of blood thinners. She has not undergone physical therapy recently, nor has she had back surgery or epidural injections. Recent PICC line placement due to poor venous access. Last platelet transfusion was 02/28/2024.        PDMP Review Status:  I personally reviewed the PDMP which was remarkable for 45 Current MME/day     Imaging:  MRI Lumbar Spine 09/13/2020  Impression   Moderate spinal canal stenosis at L4-L5 with narrowing of the lateral recess and impingement traversing bilateral L5 nerves, progressed since 2008.      Interval progression of neural foraminal narrowing at L4-L5, causing impingement of the exiting L4 nerves at this level.      Abnormal marrow signal consistent with myelofibrosis.     Narrative   EXAM: Magnetic resonance imaging, spinal canal and contents, lumbar, without contrast material.   DATE: 09/13/2020 4:53 PM   ACCESSION: [id number]   DICTATED: 09/23/2020 1:08 PM   INTERPRETATION LOCATION: [facility]      CLINICAL INDICATION: 74 years old Female with right sided sciatica ; Lumbar radiculopathy, > 6 wks  - M54.31 - Right sided sciatica        COMPARISON: MRI lumbar spine 05/21/2006.      TECHNIQUE: Multiplanar MRI was performed through the lumbar spine without intravenous contrast.      FINDINGS:   Diffuse T1 and T2 hypointense marrow signal consistent with myelofibrosis. The visualized cord is unremarkable and the conus medullaris ends at a normal level.      Dictated 1-2 anterolisthesis of L4 on L5. Disc desiccation with loss of disc height.      L1-L2: Disc bulge, ligamentum flavum hypertrophy and facet arthropathy. No spinal canal or neural foraminal narrowing.      L2-L3: Disc bulge with superimposed facet arthropathy, ligamentum flavum hypertrophy causes mild spinal canal narrowing. Disc contents contact the traversing L3 nerve bilaterally. No neural foraminal narrowing.      L3-L4: Disc bulge with superimposed facet arthropathy, ligamentum flavum hypertrophy causes mild spinal canal narrowing. Disc contents contact the traversing L4 nerves bilaterally.  No neural foraminal narrowing.      L4-L5: Retrolisthesis with superimposed disc bulge, ligamentum flavum hypertrophy and facet arthropathy causes moderate spinal canal narrowing. The disc narrows bilateral lateral recesses impinging the traversing bilateral L5 nerves. Severe bilateral neural foraminal narrowing with impingement of the exiting L4 nerves. L5-S1: No herniation. No spinal canal narrowing. No neural foraminal narrowing.      The paraspinal tissues are within normal limits.      For the purposes of this dictation, the lowest well formed intervertebral disc space is assumed to be the L5-S1 level, and there are presumed to be five lumbar-type vertebral bodies.          Pain Intervention History:  None     Opioid Risk Screening Tools:    PEG Pain Total Score: 10    ROS:  Review of systems negative unless otherwise noted as per HPI.    Allergies:   Fentanyl, Codeine, Corticosteroids (glucocorticoids), Gabapentin, Prednisone , and Pregabalin    Medications: Current Medications[1]    Past Medical History[2]  Past Surgical History[3]  Family History[4]  Short Social History[5]    Objective:       03/06/24 0855   BP: 129/76   Pulse: 77   Resp: 20   Temp: 35.8 ??C (96.4 ??F)   SpO2: 98%   Weight: 87.2 kg (192 lb 4.8 oz)   Height: 160  cm (5' 3)   PainSc: 10-Worst pain ever        Body mass index is 34.06 kg/m??.     Physical Exam:  Constitutional: Alert, no acute distress  HEENT: Normocephalic  Respiratory: Regular respiratory rate, non-labored breathing  Cardiovascular: Regular Rate. No focal peripheral edema in lower extremities  Musculoskeletal: Lumbar Spine with ROM restricted in flexion to 70 degrees, extension to 20 degrees. Focal TTP over lumbar paraspinal mm, no central bony TTP, stepoff, or deformity appreciated. SLR equivocal bilaterally. + pain with facet loading bilaterally.    Dermatologic: Skin warm and well perfused. No focal rash observed  Abdomen: non-distended  Neurologic: No focal motor or sensory deficits. Gait antalgic LE strength 4/5 B/L   Psychiatric: Mood normal. Affect appropriate    Please note that this dictation was completed with Training and development officer. Quite often unanticipated grammatical and other interpretative errors are inadvertently transcribed by the computer software. Please regard these errors. Please excuse any errors that have escaped final proofreading.    Camelia KATHEE Curl, FNP   03/06/2024   9:14 AM           [1]   Current Outpatient Medications:     albuterol  HFA 90 mcg/actuation inhaler, Inhale 2 puffs every six (6) hours as needed for wheezing or shortness of breath., Disp: 8 g, Rfl: 3    cholecalciferol , vitamin D3-50 mcg, 2,000 unit,, 50 mcg (2,000 unit) tablet, Take 1 tablet (50 mcg total) by mouth in the morning., Disp: , Rfl:     ciprofloxacin  HCl (CILOXAN ) 0.3 % ophthalmic solution, USE 3 DROPS INTO LEFT EAR TWICE DAILY FOR 5 DAYS., Disp: , Rfl:     dexAMETHasone (DECADRON) 0.1 % ophthalmic solution, USE 2 DROPS TO LEFT EAR TWICE DAILY AS DIRECTED, Disp: , Rfl:     diclofenac  sodium (VOLTAREN ) 1 % gel, Apply 4 g topically four (4) times a day as needed for arthritis or pain (Knee pain)., Disp: 100 g, Rfl: 2    fluticasone propionate (FLONASE) 50 mcg/actuation nasal spray, 2 sprays into each nostril daily as needed for allergies., Disp: , Rfl:     furosemide  (LASIX ) 40 MG tablet, Take 1 tablet (40 mg total) by mouth daily., Disp: 90 tablet, Rfl: 2    levocetirizine (XYZAL) 5 MG tablet, Take 1 tablet (5 mg total) by mouth every evening., Disp: 90 tablet, Rfl: 2    lidocaine  (LIDODERM ) 5 % patch, Place 1 patch on the skin daily. Apply to affected area for 12 hours only each day (then remove patch), Disp: , Rfl:     lovastatin  (MEVACOR ) 40 MG tablet, Take 1 tablet (40 mg total) by mouth daily., Disp: 90 tablet, Rfl: 2    miscellaneous medical supply Misc, , Disp: , Rfl:     momelotinib (OJJAARA ) 100 mg tablet, Take 1 tablet (100 mg total) by mouth daily. Swallow tablets whole; do not cut, crush, or chew., Disp: 30 tablet, Rfl: 5    NARCAN 4 mg/actuation nasal spray, 1 spray into alternating nostrils once as needed., Disp: , Rfl: 0    omeprazole  (PRILOSEC) 40 MG capsule, Take 1 capsule (40 mg total) by mouth daily., Disp: 90 capsule, Rfl: 3    ondansetron  (ZOFRAN -ODT) 4 MG disintegrating tablet, Dissolve 1 tablet (4 mg total) in the mouth every eight (8) hours as needed for nausea., Disp: 30 tablet, Rfl: 2    oxyCODONE -acetaminophen  (PERCOCET) 10-325 mg per tablet, Take 1 tablet by mouth every six (6) hours as  needed for pain., Disp: , Rfl:     oxyCODONE -acetaminophen  (PERCOCET) 10-325 mg per tablet, Take 1 tablet by mouth every 8-12 hours as needed for pain, Disp: 75 tablet, Rfl: 0    sertraline  (ZOLOFT ) 25 MG tablet, Take 1 tablet (25 mg total) by mouth daily., Disp: 90 tablet, Rfl: 3    spironolactone  (ALDACTONE ) 25 MG tablet, Take 1 tablet (25 mg total) by mouth daily., Disp: 90 tablet, Rfl: 2    ULTRA-LIGHT ROLLATOR Misc, Use as directed., Disp: , Rfl: 0  [2]   Past Medical History:  Diagnosis Date    Anxiety     Arthritis     Asthma (HHS-HCC)     Cancer    (CMS-HCC)     Cataract     Chronic kidney disease     Depression     Difficult intravenous access     GERD (gastroesophageal reflux disease)     Heart failure (CMS-HCC)     HLD (hyperlipidemia)     Hypertension     Myelofibrosis    (CMS-HCC)     Nephrolithiasis     Sleep apnea     Tinnitus    [3]   Past Surgical History:  Procedure Laterality Date    HYSTERECTOMY      PR CYSTO/URETERO W/LITHOTRIPSY &INDWELL STENT INSRT Left 08/21/2018    Procedure: priority CYSTOURETHROSCOPY, WITH URETEROSCOPY AND/OR PYELOSCOPY; WITH LITHOTRIPSY INCLUDING INSERTION OF INDWELLING URETERAL STENT;  Surgeon: Nicholaus Mt Viprakasit, MD;  Location: CYSTO PROCEDURE SUITES [facility];  Service: Urology    PR CYSTO/URETERO/PYELOSCOPY, CALCULUS TX Left 09/04/2018    Procedure: CYSTOURETHROSCOPY, W/URETEROSCOPY &/OR PYELOSCOPY; W/REMOVAL/MANIPULATION CALCULUS(URETERAL CATH INCLUDED);  Surgeon: Nicholaus Mt Viprakasit, MD;  Location: CYSTO PROCEDURE SUITES [facility];  Service: Urology    PR CYSTOSCOPY,INSERT URETERAL STENT Left 05/04/2022    Procedure: CYSTOURETHROSCOPY,  WITH INSERTION OF INDWELLING URETERAL STENT (EG, GIBBONS OR DOUBLE-J TYPE);  Surgeon: Drena Darryle Gull, MD;  Location: MAIN OR [facility];  Service: Urology    PR CYSTOSCOPY,REMV CALCULUS,SIMPLE Left 09/04/2018    Procedure: CYSTOURETHROSCOPY, WITH REMOVAL OF FOREIGN BODY, CALCULUS OR URETERAL STENT FROM URETHRA OR BLADDER; SIMPLE;  Surgeon: Nicholaus Mt Viprakasit, MD;  Location: CYSTO PROCEDURE SUITES [facility];  Service: Urology    PR CYSTOURETHROSCOPY,URETER CATHETER Left 08/21/2018    Procedure: CYSTOURETHROSCOPY, W/URETERAL CATHETERIZATION, W/WO IRRIG, INSTILL, OR URETEROPYELOG, EXCLUS OF RADIOLG SVC;  Surgeon: Nicholaus Mt Viprakasit, MD;  Location: CYSTO PROCEDURE SUITES [facility];  Service: Urology    PR CYSTOURETHROSCOPY,URETER CATHETER Left 09/04/2018    Procedure: CYSTOURETHROSCOPY, W/URETERAL CATHETERIZATION, W/WO IRRIG, INSTILL, OR URETEROPYELOG, EXCLUS OF RADIOLG SVC;  Surgeon: Nicholaus Mt Viprakasit, MD;  Location: CYSTO PROCEDURE SUITES [place];  Service: Urology    PR PERQ DILATION XST TRC NEW ACCESS RENAL COLTJ SYS Left 04/24/2022    Procedure: DILATION OF EXISTING TRACT, PERC, FOR ENDOUROLOGIC PROC INCL IMAGING GUIDANCE & RADIOLOGIC SUPERVISION & INTERPRET, W POSTPROC TUBE PLACEMENT, WHEN PERF; INCL NEW ACCESS INTO RENAL COLLECTING SYSTEM;  Surgeon: Mitch Alm Pickles, MD;  Location: [facility] OR [facility];  Service: Urology    PR PERQ NL/PL LITHOTRP COMPLEX >2 CM MLT LOCATIONS Left 04/24/2022    Procedure: standard PCNL prone vs. standard; need fortec;  Surgeon: Mitch Alm Pickles, MD;  Location: [facility] OR [facility];  Service: Urology    PR REMOVE BLADDER STONE,<2.5 CM N/A 08/21/2018    Procedure: LITHOLAPAXY: CRUSH/FRAGMENT CALCULUS, BY ANY MEANS IN BLADDER & REMOVAL OF FRAGMENTS; SIMPLE/SMALL <2.5 CM;  Surgeon: Nicholaus Mt Viprakasit, MD;  Location: CYSTO PROCEDURE  SUITES [facility];  Service: Urology    SALPINGOOPHORECTOMY Right     12/07/2010    VAGINAL HYSTERECTOMY      12/07/2010   [4]   Family History  Problem Relation Age of Onset    Hypertension Mother     Cancer Brother         bone    Hypertension Brother     Hypertension Brother     Hypertension Brother     Anesthesia problems Neg Hx     Bleeding Disorder Neg Hx     Breast cancer Neg Hx     Ovarian cancer Neg Hx     Colon cancer Neg Hx     Endometrial cancer Neg Hx    [5]   Social History  Tobacco Use    Smoking status: Never    Smokeless tobacco: Never   Vaping Use    Vaping status: Never Used   Substance Use Topics    Alcohol use: No    Drug use: No

## 2024-03-07 ENCOUNTER — Encounter: Admit: 2024-03-07 | Discharge: 2024-03-08 | Payer: Medicare (Managed Care)

## 2024-03-07 ENCOUNTER — Encounter
Admit: 2024-03-07 | Discharge: 2024-03-08 | Payer: Medicare (Managed Care) | Attending: Adult Health | Primary: Adult Health

## 2024-03-07 ENCOUNTER — Ambulatory Visit: Admit: 2024-03-07 | Discharge: 2024-03-08 | Payer: Medicare (Managed Care)

## 2024-03-07 DIAGNOSIS — D7581 Myelofibrosis: Principal | ICD-10-CM

## 2024-03-07 DIAGNOSIS — D696 Thrombocytopenia, unspecified: Secondary | ICD-10-CM

## 2024-03-07 DIAGNOSIS — C946 Myelodysplastic disease, not classified: Secondary | ICD-10-CM

## 2024-03-07 LAB — CBC W/ AUTO DIFF
BASOPHILS ABSOLUTE COUNT: 0.3 10*9/L — ABNORMAL HIGH (ref 0.0–0.1)
BASOPHILS RELATIVE PERCENT: 2.8 %
EOSINOPHILS ABSOLUTE COUNT: 0.6 10*9/L — ABNORMAL HIGH (ref 0.0–0.5)
EOSINOPHILS RELATIVE PERCENT: 5.9 %
HEMATOCRIT: 28.7 % — ABNORMAL LOW (ref 34.0–44.0)
HEMOGLOBIN: 9.5 g/dL — ABNORMAL LOW (ref 11.3–14.9)
LYMPHOCYTES ABSOLUTE COUNT: 2 10*9/L (ref 1.1–3.6)
LYMPHOCYTES RELATIVE PERCENT: 18.7 %
MEAN CORPUSCULAR HEMOGLOBIN CONC: 33.2 g/dL (ref 32.0–36.0)
MEAN CORPUSCULAR HEMOGLOBIN: 27.1 pg (ref 25.9–32.4)
MEAN CORPUSCULAR VOLUME: 81.6 fL (ref 77.6–95.7)
MEAN PLATELET VOLUME: 10.4 fL (ref 6.8–10.7)
MONOCYTES ABSOLUTE COUNT: 0.3 10*9/L (ref 0.3–0.8)
MONOCYTES RELATIVE PERCENT: 3.2 %
NEUTROPHILS ABSOLUTE COUNT: 7.3 10*9/L (ref 1.8–7.8)
NEUTROPHILS RELATIVE PERCENT: 69.4 %
NUCLEATED RED BLOOD CELLS: 1 /100{WBCs} (ref <=4)
PLATELET COUNT: 19 10*9/L — ABNORMAL LOW (ref 150–450)
RED BLOOD CELL COUNT: 3.52 10*12/L — ABNORMAL LOW (ref 3.95–5.13)
RED CELL DISTRIBUTION WIDTH: 21.3 % — ABNORMAL HIGH (ref 12.2–15.2)
WBC ADJUSTED: 10.5 10*9/L (ref 3.6–11.2)

## 2024-03-07 LAB — DRUG SCREEN, PAIN CLINIC, URINE
AMPHETAMINE SCREEN URINE: NEGATIVE
BARBITURATE SCREEN URINE: NEGATIVE
BENZODIAZEPINE SCREEN, URINE: NEGATIVE
BUPRENORPHINE, URINE SCREEN: NEGATIVE
CANNABINOID SCREEN URINE: NEGATIVE
COCAINE(METAB.)SCREEN, URINE: NEGATIVE
FENTANYL SCREEN, URINE: NEGATIVE
METHADONE SCREEN, URINE: NEGATIVE
OPIATE SCREEN URINE: POSITIVE — AB
OXYCODONE SCREEN URINE: POSITIVE — AB

## 2024-03-07 LAB — SLIDE REVIEW

## 2024-03-07 LAB — PLATELET COUNT: PLATELET COUNT: 20 10*9/L — ABNORMAL LOW (ref 150–450)

## 2024-03-07 LAB — SPECIFIC GRAVITY/PH
PH UA: 6 (ref 5.0–9.0)
SPECIFIC GRAVITY UA: 1.027 — ABNORMAL HIGH (ref 1.016–1.022)

## 2024-03-07 NOTE — Telephone Encounter (Signed)
 Hello,  Unfortunately  [facility] is unable to accept this referral as we are not servicing [place] at this time.  Some agencies you can try are [facility], [facility], [facility].

## 2024-03-07 NOTE — Progress Notes (Signed)
 Duplicate encounter. Note signed

## 2024-03-07 NOTE — Patient Instructions (Signed)
 Infusion on 03/07/2024   Component Date Value Ref Range Status    PRODUCT CODE 03/07/2024 [id number]   Final    Product ID 03/07/2024 Platelets   Final    Status 03/07/2024 Transfused   Final    Unit # 03/07/2024 [id number]   Final    Unit Blood Type 03/07/2024 A Pos   Final    ISBT Number 03/07/2024 [id number]   Final    Platelet 03/07/2024 20 (L)  150 - 450 10*9/L Final   Clinical Support on 03/07/2024   Component Date Value Ref Range Status    Blood Type 03/07/2024 B POS   Final    Antibody Screen 03/07/2024 NEG   Final    WBC 03/07/2024 10.5  3.6 - 11.2 10*9/L Final    RBC 03/07/2024 3.52 (L)  3.95 - 5.13 10*12/L Final    HGB 03/07/2024 9.5 (L)  11.3 - 14.9 g/dL Final    HCT 98/69/7973 28.7 (L)  34.0 - 44.0 % Final    MCV 03/07/2024 81.6  77.6 - 95.7 fL Final    MCH 03/07/2024 27.1  25.9 - 32.4 pg Final    MCHC 03/07/2024 33.2  32.0 - 36.0 g/dL Final    RDW 98/69/7973 21.3 (H)  12.2 - 15.2 % Final    MPV 03/07/2024 10.4  6.8 - 10.7 fL Final    Platelet 03/07/2024 19 (L)  150 - 450 10*9/L Final    nRBC 03/07/2024 1  <=4 /100 WBCs Final    Neutrophils % 03/07/2024 69.4  % Final    Lymphocytes % 03/07/2024 18.7  % Final    Monocytes % 03/07/2024 3.2  % Final    Eosinophils % 03/07/2024 5.9  % Final    Basophils % 03/07/2024 2.8  % Final    Absolute Neutrophils 03/07/2024 7.3  1.8 - 7.8 10*9/L Final    Absolute Lymphocytes 03/07/2024 2.0  1.1 - 3.6 10*9/L Final    Absolute Monocytes 03/07/2024 0.3  0.3 - 0.8 10*9/L Final    Absolute Eosinophils 03/07/2024 0.6 (H)  0.0 - 0.5 10*9/L Final    Absolute Basophils 03/07/2024 0.3 (H)  0.0 - 0.1 10*9/L Final    Anisocytosis 03/07/2024 Marked (A)  Not Present Final    Smear Review Comments 03/07/2024 See Comment  Undefined Final    Smear reviewed  Myelocytes noted      Neutrophil Left Shift 03/07/2024 Present (A)  Not Present Final

## 2024-03-07 NOTE — Progress Notes (Signed)
 Access:  PICC    Labs: Outside of parameters - Plt 19 (under 20) so per parameters to transfuse with platelets.    Patient reported new or worsening symptoms (if yes, explanation needed): No    Provider Notified: Yes [facility] Adjutant NP and J. Dunn nurse navigator - Dunn placed order for visiting nurse to perform PICC line drsg changes and to educate pt on how to perform PICC flushes with supplies at home. Also reported PPC of 20 and new appointment made for next week for count check and PICC line care    Ok to Treat given: N/A    Dignicap: N/A    Treatment Administered : Transfused 1 bag of Platelets for Plt count 19 and PPC = 20 - Hgb 9.5 so no PRBC needed per parameters    Patient Education Performed (if yes,explanation needed): Yes, reviewed lab results and plan for the day and PICC line care and need for visiting nurse to perform drsg changes weekly and for line care     Patient/Patient's Representative verbalized understanding of the education provided, with no additional questions or concerns at this time. :Yes    Time Spent on Patient Education: 10 min    Pre-transfusion Blood Return : Yes    Hypersensitivity Reaction (if yes, explanation needed): No    Post transfusion Blood Return: Yes    Heparin  Locked: Yes    Dressing: silver disc and tegaderm drsg CDI over PICC line (changed today)    Stable Discharge (if no explanation needed): Yes    - Pt ambulated with slow but stable gait to treatment area for Platelet transfusion. Pt denies any complaints at this time and denies any active S/Sx of bleeding. Pt had PICC line drsg change and labs drawn and sent with nurse visit.    - Labs resulted with Hgb 9.5 and Platelets 19, per therapy order parameters to transfuse with platelets per order.    - Pt tolerated transfusion of 1 bag platelets well with no adverse reaction noted with VSS. PPC = 20. [place] NP made aware and ok to discharge with new appointment placed for next week for count check and PICC line care until VNA is set up. PICC with good BR at completion and flushed per protocol. Patient was instructed to stop by the check out desk to complete today's visit and to receive a printed after visit summary then discharged home to self care and Pt ambulated from treatment area with stable gait accompanied by family member.

## 2024-03-07 NOTE — Addendum Note (Signed)
 Addended by: Allona Gondek K on: 03/07/2024 02:27 PM     Modules accepted: Orders

## 2024-03-07 NOTE — Progress Notes (Signed)
 RETURN VISIT AND EXAM    ASSESSMENT AND PLAN    Tympanic membrane granuloma    Tympanic membrane granuloma with expected eschar formation on the tympanic membrane as part of the healing process.   The eschar  will gradually migrate off the eardrum, and this process is not preventable with topical therapy. Observation is appropriate in the absence of significant hemotympanum.  - Scheduled follow-up in four weeks to assess healing and tympanic membrane/ middle ear status with status.  - Provided anticipatory guidance regarding healing of the tympanic membrane granuloma and perforation site.  It was planned to replace the myringotomy tube today but is not possible due to patient discomfort and the granuloma on the tympanic membrane.    Left sensorineural hearing loss  Permanent left-sided sensorineural hearing loss following sudden onset approximately 18 months ago, refractory to corticosteroid therapy. Management focuses on preservation of hearing in the contralateral ear.  - Scheduled tympanogram at next visit to assess middle ear status.  - Planned audiometric testing bilaterally to see if the hemotympanum has resolved and whether or not she needs a new tube in the right ear..  - Provided education regarding the chronic and irreversible nature of the left-sided hearing loss.    Status post myringotomy bilaterally  Status post myringotomy bilaterally.  In the left ear, the tube is functional and there is no visible evidence of bloody otorrhea.  - Scheduled follow-up in four weeks to assess post-myringotomy healing.  - Provided instructions to contact the office with any new symptoms or concerns.            Subjective:      History of Present Illness      Emily Walter is a 74 year old female with left-sided sensorineural hearing loss who presents for otolaryngology follow-up after myringotomy.    Left-Sided Sensorineural Hearing Loss:  - Sudden onset of left-sided sensorineural hearing loss approximately 18 months ago  - Hearing loss has remained permanent and unresponsive to corticosteroid therapy  - Audiometric evaluation on 12/22 confirmed sensorineural hearing loss  - Tympanometry demonstrated bilateral type B (flat) tympanograms  - Underwent myringotomy and tympanostomy tube placement in the left ear, which was the more affected side because of hemotympanum related to very low platelet count secondary to her myelofibrosis.  - No otalgia, otorrhea, vertigo, or acute changes in hearing since the last visit  - No new otologic symptoms    Right Ear Hearing Status:  - Right ear retains better hearing function prior to this episode with the hemotympanum and middle ear hemorrhage.  Remains to be seen whether she needs another tube in the right ear.  Patient too uncomfortable to replace tube in office visit today.  - Ongoing monitoring for any changes in right ear hearing             Objective:     Past Medical History:  Past Medical History[1]    Past Surgical History:  Past Surgical History[2]    Medications:  @[facility] @    Allergies:  Allergies[3]    Family History:  Family History[4]    Social History:  Short Social History[5]    Review of Systems:  A full review of systems is documented and negative except as noted in HPI. Patient intake form reviewed.       Physical Exam:   VITAL SIGNS: There were no vitals filed for this visit.     General: Patient remains in pain particularly in the neck region from  indeterminate cause with her low platelet count.  She has residual subcutaneous bruising which has not increased since her last office visit.SABRA Appropriate, comfortable, and in no apparent distress.  Head/Face: On external examination there is no obvious asymmetry or scars.  There is a small exophytic bloody crusted lesion left malar location approximately 0.5 cm.  It is not bleeding.  On palpation there is no tenderness over maxillary sinuses or masses within the salivary glands. Cranial nerves V and VII are intact through all distributions.  Eyes: PERRL, EOMI, the conjunctiva are not injected and sclera is non-icteric.  Ears:   Right ear: On external exam, there is no obvious lesions or asymmetry.  There is a granuloma, previously inserted nonfunctional tube and dried blood on the anterior inferior tympanic membrane.  The tympanic membrane was topically anesthetized with phenol with using the microscope for visualization purposes and the tube was removed from the tympanic membrane surface.  A granuloma was present and it was partially debrided.  It did appear that the perforation site  does not show any signs of infection.  There is minimal blood from the middle ear.  However there is bleeding on the tympanic membrane surface with the granuloma.. No lesions in the EAC.   Left ear: On external exam, there is no obvious lesions or asymmetry. . No lesions in the EAC.  The previously placed beveled Armstrong grommet is in position and there is no significant otorrhea or bleeding.  The tube does appear to be functional.  Hearing is known hearing loss of sensorineural type left ear.  Indeterminant amount of hearing loss present in the right ear.  Nose:  Bilateral inferior turbinate hypertrophy with septum deviated to the left.  Oral cavity/oropharynx: The mucosa of the lips, gums, hard and soft palate, posterior pharyngeal wall, tongue, floor of mouth, and buccal region are without masses or lesions and are normally hydrated. fair dentition with no signs of intraoral yeast.  oropharynx with normal mucosa no lesions or masses noted, uvula midline, tongue protrudes midline.  Tonsils are are symmetric without any masses or lesions and 1+  PIO:Lwjaoz to visualize.  Voice: clear   Neck/Lymphatics: There is no asymmetry or masses. Trachea is midline. There is no enlargement of the thyroid or palpable thyroid nodules. There is no palpable lymphadenopathy along the jugulodiagastric, submental, or posterior cervical chains.  Respiratory: No audible wheeze, unlabored respirations.  Neurologic: Cranial nerve???s II-XII are grossly intact. Exam is non-focal.  Cardiovascular: No cyanosis, no murmur, regular rhythm.    Results :  Labs  Platelet count (02/28/2024): Platelet count approximately 20,000, increased from 13,000 prior to transfusion; previous values ranged from 8,000 to 15,000         [1]   Past Medical History:  Diagnosis Date    Anxiety     Arthritis     Asthma (HHS-HCC)     Cancer    (CMS-HCC)     Cataract     Chronic kidney disease     Depression     Difficult intravenous access     GERD (gastroesophageal reflux disease)     Heart failure (CMS-HCC)     HLD (hyperlipidemia)     Hypertension     Myelofibrosis    (CMS-HCC)     Nephrolithiasis     Sleep apnea     Tinnitus    [2]   Past Surgical History:  Procedure Laterality Date    HYSTERECTOMY      PR CYSTO/URETERO W/LITHOTRIPSY &INDWELL  STENT INSRT Left 08/21/2018    Procedure: priority CYSTOURETHROSCOPY, WITH URETEROSCOPY AND/OR PYELOSCOPY; WITH LITHOTRIPSY INCLUDING INSERTION OF INDWELLING URETERAL STENT;  Surgeon: Nicholaus Mt Viprakasit, MD;  Location: CYSTO PROCEDURE SUITES [facility];  Service: Urology    PR CYSTO/URETERO/PYELOSCOPY, CALCULUS TX Left 09/04/2018    Procedure: CYSTOURETHROSCOPY, W/URETEROSCOPY &/OR PYELOSCOPY; W/REMOVAL/MANIPULATION CALCULUS(URETERAL CATH INCLUDED);  Surgeon: Nicholaus Mt Viprakasit, MD;  Location: CYSTO PROCEDURE SUITES [facility];  Service: Urology    PR CYSTOSCOPY,INSERT URETERAL STENT Left 05/04/2022    Procedure: CYSTOURETHROSCOPY,  WITH INSERTION OF INDWELLING URETERAL STENT (EG, GIBBONS OR DOUBLE-J TYPE);  Surgeon: Drena Darryle Gull, MD;  Location: MAIN OR [facility];  Service: Urology    PR CYSTOSCOPY,REMV CALCULUS,SIMPLE Left 09/04/2018    Procedure: CYSTOURETHROSCOPY, WITH REMOVAL OF FOREIGN BODY, CALCULUS OR URETERAL STENT FROM URETHRA OR BLADDER; SIMPLE;  Surgeon: Nicholaus Mt Viprakasit, MD;  Location: CYSTO PROCEDURE SUITES [facility];  Service: Urology    PR CYSTOURETHROSCOPY,URETER CATHETER Left 08/21/2018    Procedure: CYSTOURETHROSCOPY, W/URETERAL CATHETERIZATION, W/WO IRRIG, INSTILL, OR URETEROPYELOG, EXCLUS OF RADIOLG SVC;  Surgeon: Nicholaus Mt Viprakasit, MD;  Location: CYSTO PROCEDURE SUITES [facility];  Service: Urology    PR CYSTOURETHROSCOPY,URETER CATHETER Left 09/04/2018    Procedure: CYSTOURETHROSCOPY, W/URETERAL CATHETERIZATION, W/WO IRRIG, INSTILL, OR URETEROPYELOG, EXCLUS OF RADIOLG SVC;  Surgeon: Nicholaus Mt Viprakasit, MD;  Location: CYSTO PROCEDURE SUITES [place];  Service: Urology    PR PERQ DILATION XST TRC NEW ACCESS RENAL COLTJ SYS Left 04/24/2022    Procedure: DILATION OF EXISTING TRACT, PERC, FOR ENDOUROLOGIC PROC INCL IMAGING GUIDANCE & RADIOLOGIC SUPERVISION & INTERPRET, W POSTPROC TUBE PLACEMENT, WHEN PERF; INCL NEW ACCESS INTO RENAL COLLECTING SYSTEM;  Surgeon: Mitch Alm Pickles, MD;  Location: [facility] OR [facility];  Service: Urology    PR PERQ NL/PL LITHOTRP COMPLEX >2 CM MLT LOCATIONS Left 04/24/2022    Procedure: standard PCNL prone vs. standard; need fortec;  Surgeon: Mitch Alm Pickles, MD;  Location: [facility] OR [facility];  Service: Urology    PR REMOVE BLADDER STONE,<2.5 CM N/A 08/21/2018    Procedure: LITHOLAPAXY: CRUSH/FRAGMENT CALCULUS, BY ANY MEANS IN BLADDER & REMOVAL OF FRAGMENTS; SIMPLE/SMALL <2.5 CM;  Surgeon: Nicholaus Mt Viprakasit, MD;  Location: CYSTO PROCEDURE SUITES [facility];  Service: Urology    SALPINGOOPHORECTOMY Right     12/07/2010    VAGINAL HYSTERECTOMY      12/07/2010   [3]   Allergies  Allergen Reactions    Fentanyl Anaphylaxis    Codeine Other (See Comments)     Headache, nausea    Corticosteroids (Glucocorticoids) Headache     **ALL STEROIDS**    Gabapentin Other (See Comments)     oversedation    Other Reaction(s): Other (see comments)    oversedation      Other reaction(s): Other (See Comments)   oversedation   oversedation   oversedation    Prednisone  Headache    Pregabalin Rash     Swelling and general fatigue    Other Reaction(s): Other (see comments)    Swelling and general fatigue   Other reaction(s): Other (see comments)   Swelling and general fatigue   Swelling and general fatigue   [4]   Family History  Problem Relation Age of Onset    Hypertension Mother     Cancer Brother         bone    Hypertension Brother     Hypertension Brother     Hypertension Brother     Anesthesia problems Neg Hx     Bleeding Disorder Neg  Hx     Breast cancer Neg Hx     Ovarian cancer Neg Hx     Colon cancer Neg Hx     Endometrial cancer Neg Hx    [5]   Social History  Tobacco Use    Smoking status: Never    Smokeless tobacco: Never   Vaping Use    Vaping status: Never Used   Substance Use Topics    Alcohol use: No    Drug use: No

## 2024-03-08 DIAGNOSIS — D7581 Myelofibrosis: Principal | ICD-10-CM

## 2024-03-13 ENCOUNTER — Encounter: Admit: 2024-03-13 | Discharge: 2024-03-13 | Payer: Medicare (Managed Care)

## 2024-03-13 ENCOUNTER — Ambulatory Visit: Admit: 2024-03-13 | Discharge: 2024-03-13 | Payer: Medicare (Managed Care)

## 2024-03-13 ENCOUNTER — Encounter
Admit: 2024-03-13 | Discharge: 2024-03-13 | Payer: Medicare (Managed Care) | Attending: Adult Health | Primary: Adult Health

## 2024-03-13 DIAGNOSIS — C946 Myelodysplastic disease, not classified: Secondary | ICD-10-CM

## 2024-03-13 DIAGNOSIS — D7581 Myelofibrosis: Principal | ICD-10-CM

## 2024-03-13 DIAGNOSIS — D696 Thrombocytopenia, unspecified: Secondary | ICD-10-CM

## 2024-03-13 LAB — PLATELET COUNT: PLATELET COUNT: 21 10*9/L — ABNORMAL LOW (ref 150–450)

## 2024-03-13 LAB — CBC W/ AUTO DIFF
BASOPHILS ABSOLUTE COUNT: 0.2 10*9/L — ABNORMAL HIGH (ref 0.0–0.1)
BASOPHILS RELATIVE PERCENT: 2.1 %
EOSINOPHILS ABSOLUTE COUNT: 0.5 10*9/L (ref 0.0–0.5)
EOSINOPHILS RELATIVE PERCENT: 4.8 %
HEMATOCRIT: 27.9 % — ABNORMAL LOW (ref 34.0–44.0)
HEMOGLOBIN: 9.3 g/dL — ABNORMAL LOW (ref 11.3–14.9)
LYMPHOCYTES ABSOLUTE COUNT: 1.6 10*9/L (ref 1.1–3.6)
LYMPHOCYTES RELATIVE PERCENT: 16.7 %
MEAN CORPUSCULAR HEMOGLOBIN CONC: 33.5 g/dL (ref 32.0–36.0)
MEAN CORPUSCULAR HEMOGLOBIN: 27.4 pg (ref 25.9–32.4)
MEAN CORPUSCULAR VOLUME: 81.8 fL (ref 77.6–95.7)
MEAN PLATELET VOLUME: 7 fL (ref 6.8–10.7)
MONOCYTES ABSOLUTE COUNT: 0.2 10*9/L — ABNORMAL LOW (ref 0.3–0.8)
MONOCYTES RELATIVE PERCENT: 2.3 %
NEUTROPHILS ABSOLUTE COUNT: 7.2 10*9/L (ref 1.8–7.8)
NEUTROPHILS RELATIVE PERCENT: 74.1 %
NUCLEATED RED BLOOD CELLS: 1 /100{WBCs} (ref <=4)
PLATELET COUNT: 14 10*9/L — ABNORMAL LOW (ref 150–450)
RED BLOOD CELL COUNT: 3.41 10*12/L — ABNORMAL LOW (ref 3.95–5.13)
RED CELL DISTRIBUTION WIDTH: 21.3 % — ABNORMAL HIGH (ref 12.2–15.2)
WBC ADJUSTED: 9.8 10*9/L (ref 3.6–11.2)

## 2024-03-13 MED ADMIN — heparin, porcine (PF) 100 unit/mL injection 200 Units: 200 [IU] | INTRAVENOUS | @ 20:00:00 | Stop: 2024-03-13

## 2024-03-13 MED FILL — OJJAARA 100 MG TABLET: ORAL | 30 days supply | Qty: 30 | Fill #2

## 2024-03-13 NOTE — Progress Notes (Signed)
 Access:  PICC    Labs: Within parameters for platelet transfusion today, plts 14  Plt recount was 21 after 1 unit, rebecca Sawchak NP notified, pt has another transfusion appt scheduled for 03/17/24    Patient reported new or worsening symptoms (if yes, explanation needed): No    Provider Notified: N/A    Ok to Treat given: N/A    Dignicap: No    Treatment Administered : transfusion    Patient Education Performed (if yes,explanation needed): No    Patient/Patient's Representative verbalized understanding of the education provided, with no additional questions or concerns at this time. :N/A    Time Spent on Patient Education: 0 min    Pre-Chemotherapy Blood Return : N/A    Hypersensitivity Reaction (if yes, explanation needed): No    Post Chemotherapy Blood Return: N/A    Heparin  Locked: Yes    Dressing: silver disk dressing over picc line, changed today    Stable Discharge (if no explanation needed): Yes    Patient was instructed to stop by the check out desk to complete today's visit and to receive a printed after visit summary then discharged home to self care.

## 2024-03-14 LAB — OPIATE, URINE, QUANTITATIVE
6-MONOACETYLMRPH: <5 ng/mL (ref <5)
BUPRENORPHINE: <5 ng/mL (ref <5)
CODEINE GC/MS CONF: <25 ng/mL (ref <25)
FENTANYL, URINE GC/MS: <0.5 ng/mL (ref <0.5)
HYDROCODONE GC/MS CONF: <25 ng/mL (ref <25)
HYDROMORPHONE GC/MS CONF: <25 ng/mL (ref <25)
MORPHINE GC/MS CONF: <25 ng/mL (ref <25)
NORBUPRENORPHINE: <5 ng/mL (ref <5)
NORFENTANYL, UR GC/MS: <1 ng/mL (ref <1.0)
OPIATE INTERP: POSITIVE
OXYCODONE (GC/MS): 437 ng/mL — ABNORMAL HIGH (ref <25)
OXYMORPHONE: 2527 ng/mL — ABNORMAL HIGH (ref <25)

## 2024-03-21 MED ORDER — OXYCODONE-ACETAMINOPHEN 10 MG-325 MG TABLET
ORAL_TABLET | ORAL | 0 refills | 0.00000 days | Status: CP
Start: 2024-03-21 — End: ?
